# Patient Record
Sex: Male | Born: 1945 | Race: White | Hispanic: No | Smoking: Former smoker
Health system: Southern US, Community
[De-identification: ages and names within clinical notes are randomized; demographics above are authoritative.]

## PROBLEM LIST (undated history)

## (undated) DIAGNOSIS — M8580 Other specified disorders of bone density and structure, unspecified site: Secondary | ICD-10-CM

## (undated) DIAGNOSIS — K648 Other hemorrhoids: Secondary | ICD-10-CM

## (undated) DIAGNOSIS — T7840XA Allergy, unspecified, initial encounter: Secondary | ICD-10-CM

## (undated) DIAGNOSIS — Z923 Personal history of irradiation: Secondary | ICD-10-CM

## (undated) DIAGNOSIS — I2510 Atherosclerotic heart disease of native coronary artery without angina pectoris: Secondary | ICD-10-CM

## (undated) DIAGNOSIS — M1990 Unspecified osteoarthritis, unspecified site: Secondary | ICD-10-CM

## (undated) DIAGNOSIS — K580 Irritable bowel syndrome with diarrhea: Secondary | ICD-10-CM

## (undated) DIAGNOSIS — G629 Polyneuropathy, unspecified: Secondary | ICD-10-CM

## (undated) DIAGNOSIS — Z87898 Personal history of other specified conditions: Secondary | ICD-10-CM

## (undated) DIAGNOSIS — J45909 Unspecified asthma, uncomplicated: Secondary | ICD-10-CM

## (undated) DIAGNOSIS — R011 Cardiac murmur, unspecified: Secondary | ICD-10-CM

## (undated) DIAGNOSIS — M810 Age-related osteoporosis without current pathological fracture: Secondary | ICD-10-CM

## (undated) DIAGNOSIS — N320 Bladder-neck obstruction: Secondary | ICD-10-CM

## (undated) DIAGNOSIS — Z960 Presence of urogenital implants: Secondary | ICD-10-CM

## (undated) DIAGNOSIS — C61 Malignant neoplasm of prostate: Secondary | ICD-10-CM

## (undated) DIAGNOSIS — G43909 Migraine, unspecified, not intractable, without status migrainosus: Secondary | ICD-10-CM

## (undated) DIAGNOSIS — Z978 Presence of other specified devices: Secondary | ICD-10-CM

## (undated) DIAGNOSIS — D649 Anemia, unspecified: Secondary | ICD-10-CM

## (undated) DIAGNOSIS — R002 Palpitations: Secondary | ICD-10-CM

## (undated) DIAGNOSIS — E785 Hyperlipidemia, unspecified: Secondary | ICD-10-CM

## (undated) DIAGNOSIS — I491 Atrial premature depolarization: Secondary | ICD-10-CM

## (undated) DIAGNOSIS — K5790 Diverticulosis of intestine, part unspecified, without perforation or abscess without bleeding: Secondary | ICD-10-CM

## (undated) DIAGNOSIS — N210 Calculus in bladder: Secondary | ICD-10-CM

## (undated) DIAGNOSIS — N401 Enlarged prostate with lower urinary tract symptoms: Secondary | ICD-10-CM

## (undated) HISTORY — DX: Hyperlipidemia, unspecified: E78.5

## (undated) HISTORY — PX: PROSTATE BIOPSY: SHX241

## (undated) HISTORY — DX: Allergy, unspecified, initial encounter: T78.40XA

## (undated) HISTORY — PX: CATARACT EXTRACTION W/ INTRAOCULAR LENS  IMPLANT, BILATERAL: SHX1307

## (undated) HISTORY — PX: CYSTOSCOPY WITH INSERTION OF UROLIFT: SHX6678

## (undated) HISTORY — DX: Age-related osteoporosis without current pathological fracture: M81.0

## (undated) HISTORY — DX: Irritable bowel syndrome with diarrhea: K58.0

## (undated) HISTORY — PX: OTHER SURGICAL HISTORY: SHX169

## (undated) HISTORY — DX: Diverticulosis of intestine, part unspecified, without perforation or abscess without bleeding: K57.90

## (undated) HISTORY — DX: Bladder-neck obstruction: N32.0

## (undated) HISTORY — DX: Unspecified asthma, uncomplicated: J45.909

## (undated) HISTORY — DX: Other hemorrhoids: K64.8

## (undated) HISTORY — DX: Unspecified osteoarthritis, unspecified site: M19.90

## (undated) HISTORY — DX: Cardiac murmur, unspecified: R01.1

## (undated) HISTORY — PX: KNEE ARTHROSCOPY: SUR90

## (undated) HISTORY — PX: CIRCUMCISION: SUR203

## (undated) HISTORY — PX: CARDIOVASCULAR STRESS TEST: SHX262

---

## 1990-04-22 HISTORY — PX: INGUINAL HERNIA REPAIR: SUR1180

## 1999-03-09 ENCOUNTER — Encounter: Payer: Self-pay | Admitting: *Deleted

## 1999-03-09 ENCOUNTER — Encounter: Admission: RE | Admit: 1999-03-09 | Discharge: 1999-03-09 | Payer: Self-pay | Admitting: *Deleted

## 2013-02-09 ENCOUNTER — Encounter: Payer: Self-pay | Admitting: Neurology

## 2013-02-10 ENCOUNTER — Encounter: Payer: Self-pay | Admitting: Neurology

## 2013-02-10 ENCOUNTER — Encounter (INDEPENDENT_AMBULATORY_CARE_PROVIDER_SITE_OTHER): Payer: Self-pay

## 2013-02-10 ENCOUNTER — Ambulatory Visit (INDEPENDENT_AMBULATORY_CARE_PROVIDER_SITE_OTHER): Payer: Medicare Other | Admitting: Neurology

## 2013-02-10 ENCOUNTER — Ambulatory Visit (INDEPENDENT_AMBULATORY_CARE_PROVIDER_SITE_OTHER): Payer: Self-pay | Admitting: Neurology

## 2013-02-10 VITALS — BP 126/77 | HR 59 | Ht 73.0 in | Wt 182.0 lb

## 2013-02-10 DIAGNOSIS — R51 Headache: Secondary | ICD-10-CM

## 2013-02-10 DIAGNOSIS — Z0289 Encounter for other administrative examinations: Secondary | ICD-10-CM

## 2013-02-10 MED ORDER — PREDNISONE 5 MG PO TABS: ORAL_TABLET | ORAL | Status: DC

## 2013-02-10 MED ORDER — VALPROATE SODIUM 500 MG/5ML IV SOLN
1500.0000 mg | INTRAVENOUS | Status: DC
  Administered 2013-02-10: 1500 mg via INTRAVENOUS

## 2013-02-10 NOTE — Progress Notes (Signed)
Reason for visit: Headache  Dylan Woods is a 67 y.o. male  History of present illness:  Dylan Woods is a 67 year old right-handed white male with a history of headaches that began approximately 2 weeks prior to this evaluation. The patient indicates that he has a history of intermittent headaches over the last 10-12 years, usually the headaches will last about 2 hours and then go away. Occasionally the headaches may be severe, but on average the patient will have one headache every 2 weeks. The headaches may be generalized in nature, unassociated with photophobia, phonophobia, nausea or vomiting, or focal symptoms of numbness or weakness of the face, arms, or legs. The patient indicates that he had onset of an unusual headache for him 2 weeks ago. The patient has had some problems with generalized headache pain, but currently the headache is on the right side of the head, and he has noted onset of pulsatile tinnitus involving the right ear. The patient again notes no nausea or vomiting, numbness or weakness on the extremities, and he has not had any visual change or focal symptoms or problems with cognitive processing. The patient has taken, Tylenol #3, but the headache has persisted. The patient denies any gait disturbance, but he does have some neck discomfort and neck stiffness. The patient is sent to this office for further evaluation. The patient has had some associated malaise with the headache, but no overt fevers or chills. No cognitive slowing has been noted with the headache.  Past Medical History  Diagnosis Date  . BPH (benign prostatic hyperplasia)   . Chronic headaches   . Headache(784.0) 02/10/2013  . Degenerative arthritis     Past Surgical History  Procedure Laterality Date  . Cataract extraction Bilateral   . Arthroscopic knee surgery Left   . Inguinal hernia repair Right     Family History  Problem Relation Age of Onset  . Parkinsonism Father     Social history:   reports that he has quit smoking. He has never used smokeless tobacco. He reports that he drinks alcohol. He reports that he does not use illicit drugs.  Medications:  Current Outpatient Prescriptions on File Prior to Visit  Medication Sig Dispense Refill  . acetaminophen-codeine (TYLENOL #3) 300-30 MG per tablet Take 1 tablet by mouth every 4 (four) hours as needed for pain.      Marland Kitchen ALPRAZolam (XANAX) 0.25 MG tablet Take 0.25 mg by mouth 3 (three) times daily as needed for sleep.      . Calcium Ascorbate 500 MG TABS Take 500 mg by mouth daily.      . celecoxib (CELEBREX) 200 MG capsule Take 200 mg by mouth 2 (two) times daily.      . Multiple Vitamin (MULTIVITAMIN) tablet Take 1 tablet by mouth daily.      . naproxen sodium (ANAPROX) 220 MG tablet Take 220 mg by mouth 2 (two) times daily with a meal.      . zolpidem (AMBIEN) 5 MG tablet Take 5 mg by mouth at bedtime as needed for sleep.       No current facility-administered medications on file prior to visit.      Allergies  Allergen Reactions  . Ampicillin Rash    ROS:  Out of a complete 14 system review of symptoms, the patient complains only of the following symptoms, and all other reviewed systems are negative.  Fatigue Ringing in the ears Blurred vision Incontinence, impotence Headache, weakness Decreased energy  Blood  pressure 126/77, pulse 59, height 6\' 1"  (1.854 m), weight 182 lb (82.555 kg).  Physical Exam  General: The patient is alert and cooperative at the time of the examination.  Head: Pupils are equal, round, and reactive to light. Discs are flat bilaterally.  Neck: The neck is supple, no carotid bruits are noted.  Respiratory: The respiratory examination is clear.  Cardiovascular: The cardiovascular examination reveals a regular rate and rhythm, no obvious murmurs or rubs are noted.  Neuromuscular: Range of movement of the cervical spine is full. Compression of the jugular vein on the right does not  stop the pulsatile tinnitus. No significant crepitus of the temporomandibular joints are noted.  Skin: Extremities are without significant edema.  Neurologic Exam  Mental status: The patient is alert and oriented at the time of examination.  Cranial nerves: Facial symmetry is present. There is good sensation of the face to pinprick and soft touch bilaterally. The strength of the facial muscles and the muscles to head turning and shoulder shrug are normal bilaterally. Speech is well enunciated, no aphasia or dysarthria is noted. Extraocular movements are full. Visual fields are full.  Motor: The motor testing reveals 5 over 5 strength of all 4 extremities. Good symmetric motor tone is noted throughout.  Sensory: Sensory testing is intact to pinprick, soft touch, vibration sensation, and position sense on all 4 extremities. No evidence of extinction is noted.  Coordination: Cerebellar testing reveals good finger-nose-finger and heel-to-shin bilaterally.  Gait and station: Gait is normal. Tandem gait is slightly unsteady. Romberg is negative. No drift is seen.  Reflexes: Deep tendon reflexes are symmetric and normal bilaterally. Toes are downgoing bilaterally.   Assessment/Plan:  One. Headache  The patient has an unusual type of headache that has lasted almost 2 weeks. The patient does have a history of intermittent headaches prior to this, but currently, the patient has had new onset of right-sided headaches and pulsatile tinnitus on the right. For this reason, the patient be set up for MRI of the brain, and MRA of the head. MRA is ordered to exclude a vascular source of the pulsatile tinnitus and headache. The patient will be given a Depacon injection today, and he will be placed on a prednisone Dosepak for 6 days. If the headache continues, the patient will contact our office, and a daily medication will be initiated. The patient will followup in 2 months. A sedimentation rate was done  previously, with a level of 7. A CT scan of brain was done that was unremarkable.  Marlan Palau MD 02/10/2013 7:57 PM  [facility] [address] [place], [place] [address]  Phone [phone] Fax [phone]

## 2013-02-10 NOTE — Patient Instructions (Addendum)
Patient will move around and see if pain level comes down.

## 2013-02-10 NOTE — Progress Notes (Signed)
Patient here seeing Dr. Anne Hahn.  Order for Depacon 1000mg  may repeat 500mg  additional if needed.  Patient to treatment room.  IV started in right AC, good blood return, 24g angiocath.  Depacon 1000mg /100ccNS started at 1017.  Patient headache pain is 5 out of 10.  Upon completion patient had no change in pain.  Another Depacon 500mg /100cc started, and when completed patient stated his headache down to level 3 with some pain in his right jaw.  IV discontinued and removed.  Patient to check out in NAD.

## 2013-02-15 ENCOUNTER — Telehealth: Payer: Self-pay | Admitting: Neurology

## 2013-02-15 MED ORDER — TOPIRAMATE 25 MG PO TABS: ORAL_TABLET | ORAL | Status: DC

## 2013-02-15 NOTE — Telephone Encounter (Signed)
I called patient. The headaches are still present, less severe. The prednisone and Depacon offered some benefit, but the headaches are still there. I will place the patient on Topamax. The patient has not heard about MRI scheduling, I will contact Bradly Chris.

## 2013-02-15 NOTE — Telephone Encounter (Signed)
Patient left message that even after the Depacon infusion on 02-10-13 there has been no marked improvement in his headaches.  He said they are coming and going and was told to call if not better.  He is also asking the his MRI and MRA are put on the "fast track".  [phone].

## 2013-02-23 ENCOUNTER — Telehealth: Payer: Self-pay | Admitting: Neurology

## 2013-02-23 ENCOUNTER — Ambulatory Visit
Admission: RE | Admit: 2013-02-23 | Discharge: 2013-02-23 | Disposition: A | Discharge status: Home / Self Care | Payer: Medicare Other | Source: Ambulatory Visit | Attending: Neurology | Admitting: Neurology

## 2013-02-23 DIAGNOSIS — R51 Headache: Secondary | ICD-10-CM

## 2013-02-23 DIAGNOSIS — J329 Chronic sinusitis, unspecified: Secondary | ICD-10-CM

## 2013-02-23 MED ORDER — NORTRIPTYLINE HCL 10 MG PO CAPS: ORAL_CAPSULE | ORAL | Status: DC

## 2013-02-23 NOTE — Telephone Encounter (Signed)
I called patient. The patient still having ongoing headaches, better in the morning, coming on in or around noon. The patient indicates that the headaches are on the right side the head, right jaw. MRI the brain was unremarkable, MRA was unremarkable. There is evidence of severe sinus involvement of the right maxillary sinus, but the headaches do not sound like a sinus issue. I will refer the patient to an ear nose and throat Dr. The patient will be going up to 50 mg of Topamax, I'll add nortriptyline as well.

## 2013-03-08 ENCOUNTER — Telehealth: Payer: Self-pay | Admitting: Nurse Practitioner

## 2013-03-10 NOTE — Telephone Encounter (Signed)
Please advise 

## 2013-03-10 NOTE — Telephone Encounter (Signed)
I called the patient. The patient is doing quite well, he has worked up to the 30 mg of the [employer], and he is tolerating the medication. The patient will want to come off the medication in the future his headaches continue to do well. The patient is off of prednisone. The patient was wondering if the Celebrex or meloxicam would interfere or interact with the Topamax or nortriptyline. I have no problems with the concurrent use of these medications.

## 2013-04-22 HISTORY — PX: NASAL SINUS SURGERY: SHX719

## 2013-05-06 ENCOUNTER — Ambulatory Visit: Payer: Medicare Other | Admitting: Nurse Practitioner

## 2013-05-17 ENCOUNTER — Other Ambulatory Visit: Payer: Self-pay | Admitting: Neurology

## 2013-05-25 ENCOUNTER — Ambulatory Visit (INDEPENDENT_AMBULATORY_CARE_PROVIDER_SITE_OTHER): Payer: Medicare Other | Admitting: Nurse Practitioner

## 2013-05-25 ENCOUNTER — Encounter: Payer: Self-pay | Admitting: Nurse Practitioner

## 2013-05-25 ENCOUNTER — Encounter (INDEPENDENT_AMBULATORY_CARE_PROVIDER_SITE_OTHER): Payer: Self-pay

## 2013-05-25 VITALS — BP 133/82 | HR 72 | Ht 72.0 in | Wt 179.0 lb

## 2013-05-25 DIAGNOSIS — R51 Headache: Secondary | ICD-10-CM

## 2013-05-25 NOTE — Progress Notes (Signed)
I have read the note, and I agree with the clinical assessment and plan.  WILLIS,CHARLES KEITH   

## 2013-05-25 NOTE — Progress Notes (Signed)
[facility]  PATIENT: Dylan Woods DOB: 10/05/1945   REASON FOR VISIT: Followup for headaches   HISTORY OF PRESENT ILLNESS: Mr. Boen, 68 year old male returns for followup. He was initially evaluated by Dr. [name] 02/10/2013. MRA of the head was normal. 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  . Brain parenchyma was unremarkable. He was sent to an ENT and he had a nasal endoscopy and was told he had an impacted fungal infection. He feels his headaches have come from that all along and he wants to taper and discontinue his Topamax and his Pamelor. His headaches have gotten much better since his nasal procedure. He returns for reevaluation   HISTORY: headaches that began approximately 2 weeks prior to this evaluation. The patient indicates that he has a history of intermittent headaches over the last 10-12 years, usually the headaches will last about 2 hours and then go away. Occasionally the headaches may be severe, but on average the patient will have one headache every 2 weeks. The headaches may be generalized in nature, unassociated with photophobia, phonophobia, nausea or vomiting, or focal symptoms of numbness or weakness of the face, arms, or legs. The patient indicates that he had onset of an unusual headache for him 2 weeks ago. The patient has had some problems with generalized headache pain, but currently the headache is on the right side of the head, and he has noted onset of pulsatile tinnitus involving the right ear. The patient again notes no nausea or vomiting, numbness or weakness on the extremities, and he has not had any visual change or focal symptoms or problems with cognitive processing. The patient has taken, Tylenol #3, but the headache has persisted. The patient denies any gait disturbance, but he does have some neck discomfort and neck stiffness. The patient is sent to this office for further evaluation. The  patient has had some associated malaise with the headache, but no overt fevers or chills. No cognitive slowing has been noted with the headache.  REVIEW OF SYSTEMS: Full 14 system review of systems performed and notable only for those listed, all others are neg:  Constitutional: N/A  Cardiovascular: N/A  Ear/Nose/Throat: N/A  Skin: N/A  Eyes: Blurred vision  Respiratory: N/A  Gastroitestinal: N/A  Hematology/Lymphatic: N/A  Endocrine: N/A Musculoskeletal:N/A  Allergy/Immunology: N/A  Neurological: N/A Psychiatric: N/A   ALLERGIES: Allergies  Allergen Reactions  . Ampicillin Rash    HOME MEDICATIONS: Outpatient Prescriptions Prior to Visit  Medication Sig Dispense Refill  . acetaminophen-codeine (TYLENOL #3) 300-30 MG per tablet Take 1 tablet by mouth every 4 (four) hours as needed for pain.      Marland Kitchen ALPRAZolam (XANAX) 0.25 MG tablet Take 0.25 mg by mouth 3 (three) times daily as needed for sleep.      . Calcium Ascorbate 500 MG TABS Take 500 mg by mouth daily.      . celecoxib (CELEBREX) 200 MG capsule Take 200 mg by mouth 2 (two) times daily.      . Multiple Vitamin (MULTIVITAMIN) tablet Take 1 tablet by mouth daily.      . naproxen sodium (ANAPROX) 220 MG tablet Take 220 mg by mouth 2 (two) times daily with a meal.      . nortriptyline (PAMELOR) 10 MG capsule Take 30 mg by mouth at bedtime.      . topiramate (TOPAMAX) 25 MG tablet Take 50 mg by mouth at bedtime.      Marland Kitchen  topiramate (TOPAMAX) 25 MG tablet TAKE ONE TABLET AT NIGHT FOR 1 WEEK THENTAKE 2 TABLETS AT NIGHT  60 tablet  1  . zolpidem (AMBIEN) 5 MG tablet Take 5 mg by mouth at bedtime as needed for sleep.       Facility-Administered Medications Prior to Visit  Medication Dose Route Frequency Provider Last Rate Last Dose  . valproate (DEPACON) 1,500 mg in sodium chloride 0.9 % 100 mL IVPB  1,500 mg Intravenous Continuous Kathrynn Ducking, MD   1,500 mg at 02/10/13 1020    PAST MEDICAL HISTORY: Past Medical History    Diagnosis Date  . BPH (benign prostatic hyperplasia)   . Chronic headaches   . Headache(784.0) 02/10/2013  . Degenerative arthritis     PAST SURGICAL HISTORY: Past Surgical History  Procedure Laterality Date  . Cataract extraction Bilateral   . Arthroscopic knee surgery Left   . Inguinal hernia repair Right     FAMILY HISTORY: Family History  Problem Relation Age of Onset  . Parkinsonism Father     SOCIAL HISTORY: History   Social History  . Marital Status: Widowed    Spouse Name: N/A    Number of Children: 1  . Years of Education: college   Occupational History  .      Lawyer   Social History Main Topics  . Smoking status: Former Smoker    Types: Cigarettes  . Smokeless tobacco: Never Used     Comment: Quit when he was 40  . Alcohol Use: 0.0 oz/week     Comment: moderate level  . Drug Use: No  . Sexual Activity: Not on file   Other Topics Concern  . Not on file   Social History Narrative   Patient lives at home alone widowed.   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   Right handed.   College education.   Caffeine one cup daily coffee.     PHYSICAL EXAM  Filed Vitals:   05/25/13 0932  BP: 133/82  Pulse: 72  Height: 6' (1.829 m)  Weight: 179 lb (81.194 kg)   Body mass index is 24.27 kg/(m^2).  Generalized: Well developed, in no acute distress  Head: normocephalic and atraumatic,. Oropharynx benign  Neck: Supple, no carotid bruits  Cardiac: Regular rate rhythm, no murmur  Neurological examination   Mentation: Alert oriented to time, place, history taking. Follows all commands speech and language fluent  Cranial nerve II-XII: Fundoscopic exam reveals sharp disc margins.Pupils were equal round reactive to light extraocular movements were full, visual field were full on confrontational test. Facial sensation and strength were normal. hearing was intact to finger rubbing bilaterally. Uvula tongue midline. head turning and  shoulder shrug were normal and symmetric.Tongue protrusion into cheek strength was normal. Motor: normal bulk and tone, full strength in the BUE, BLE, fine finger movements normal, no pronator drift. No focal weakness Coordination: finger-nose-finger, heel-to-shin bilaterally, no dysmetria Reflexes: Brachioradialis 2/2, biceps 2/2, triceps 2/2, patellar 2/2, Achilles 2/2, plantar responses were flexor bilaterally. Gait and Station: Rising up from seated position without assistance, normal stance,  moderate stride, good arm swing, smooth turning, able to perform tiptoe, and heel walking without difficulty. Tandem gait is steady  DIAGNOSTIC DATA (LABS, IMAGING, TESTING) -     ASSESSMENT AND PLAN  68 y.o. year old male  has a past medical history of BPH (benign prostatic hyperplasia);  Headache(784.0)  here in followup. MRA of the head was normal. 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 His headaches completely abated after his nasal procedure and he is wishing to titrate his medications and discontinued them.  Begin tapering Pamelor(Nortriptyline by 1 tab weekly until you are off the medication. This should take 2 weeks if you begin tonight.  After the taper of above, begin to taper the Topamax by 1 tab weekly until off the medication. This should take a week.  Call if headaches resume Additional 10 minutes spent explaining the tapering procedure.  F/U prn Dennie Bible, [facility], [facility], APRN  [facility] [address] [place], [place] [address] [phone]

## 2013-05-25 NOTE — Patient Instructions (Signed)
Begin tapering Pamelor(Nortriptyline by 1 tab weekly until you are off the medication. This should take 2 weeks if you begin tonight.  After the taper of above, begin to taper the Topamax by 1 tab weekly until off the medication. This should take a week.  Call if headaches resume F/U prn

## 2013-07-01 ENCOUNTER — Telehealth: Payer: Self-pay | Admitting: *Deleted

## 2013-07-02 MED ORDER — TOPIRAMATE 25 MG PO TABS: 50.0000 mg | ORAL_TABLET | Freq: Every day | ORAL | Status: DC

## 2013-07-02 NOTE — Telephone Encounter (Signed)
Talked to patient. He has totally tapered the nortriptyline and is on 25 mg of Topamax. He can increase to 2 tabs (50mg ). The headaches are mild but he does not want to ruin his vacation. Will call in.

## 2013-07-02 NOTE — Telephone Encounter (Signed)
Pt called to check the status of his request.  He states that he is on 1 Topamax and is completed off the Nortriptyline as he discussed with Cecille Rubin at their last visit.  Please call to discuss.  Thank you.

## 2013-07-02 NOTE — Telephone Encounter (Signed)
Called patient and patient stated that he was tapering off of his medication of topamax and nortriptyline. Patient stated that he is done with nortriptyline and almost done with topamax. Patient stated that he has been having mild headaches that last a couple of hours and he stated that he would be finish with topamax beginning next week and he was going on an easter trip and wanted to know if Hoyle Sauer, NP could give him a call concerning this matter. Patient concerned about headaches coming back once he completely stops medication. Please advise

## 2013-08-11 ENCOUNTER — Telehealth: Payer: Self-pay | Admitting: Neurology

## 2013-08-11 NOTE — Telephone Encounter (Signed)
Patient states [employer] has sent request to our office to fax to them Rx for Topiramate--patient says do not release  Rx to them--will keep Rx @ [facility] can call patient if needed--thank you.

## 2013-08-11 NOTE — Telephone Encounter (Signed)
Received request from [employer] this afternoon.  Noted patient does not wish to use mail order.

## 2013-09-23 ENCOUNTER — Ambulatory Visit (INDEPENDENT_AMBULATORY_CARE_PROVIDER_SITE_OTHER): Payer: Medicare Other | Admitting: General Surgery

## 2013-09-23 ENCOUNTER — Encounter (INDEPENDENT_AMBULATORY_CARE_PROVIDER_SITE_OTHER): Payer: Self-pay | Admitting: General Surgery

## 2013-09-23 VITALS — BP 134/76 | HR 76 | Temp 97.8°F | Ht 73.0 in | Wt 175.0 lb

## 2013-09-23 DIAGNOSIS — K4090 Unilateral inguinal hernia, without obstruction or gangrene, not specified as recurrent: Secondary | ICD-10-CM | POA: Insufficient documentation

## 2013-09-23 NOTE — Patient Instructions (Signed)

## 2013-09-23 NOTE — Progress Notes (Signed)
Patient ID: Dylan Woods, male   DOB: 1945/06/29, 68 y.o.   MRN: [id number]  Chief Complaint  Patient presents with  . eval lih    HPI Dylan Woods is a 68 y.o. male.  Referred by Dr Claudia Desanctis HPI 69 yom who 2 months ago note left testicular swelling and tenderness. He was diagnosed with epididymitis and has been treated with several courses of abx. He has been seen by urology and also noted to have [facility] and referred.  The swelling is better although he notes testicle is still different and sore.  There is occasional pain present.  He has been receiving toradol at the urology office.  He does note a small bulge.  He has no changes in bms or other symptoms.  Past Medical History  Diagnosis Date  . BPH (benign prostatic hyperplasia)   . Chronic headaches   . Headache(784.0) 02/10/2013  . Degenerative arthritis   . Asthma   . Heart murmur     Past Surgical History  Procedure Laterality Date  . Cataract extraction Bilateral   . Arthroscopic knee surgery Left   . Inguinal hernia repair Right   . Nasal sinus surgery      Family History  Problem Relation Age of Onset  . Parkinsonism Father     Social History History  Substance Use Topics  . Smoking status: Former Smoker    Types: Cigarettes  . Smokeless tobacco: Never Used     Comment: Quit when he was 6  . Alcohol Use: 0.0 oz/week     Comment: moderate level    Allergies  Allergen Reactions  . Ampicillin Rash    Current Outpatient Prescriptions  Medication Sig Dispense Refill  . acetaminophen-codeine (TYLENOL #3) 300-30 MG per tablet Take 1 tablet by mouth every 4 (four) hours as needed for pain.      Marland Kitchen ALPRAZolam (XANAX) 0.25 MG tablet Take 0.25 mg by mouth 3 (three) times daily as needed for sleep.      . Calcium Ascorbate 500 MG TABS Take 500 mg by mouth daily.      . Multiple Vitamin (MULTIVITAMIN) tablet Take 1 tablet by mouth daily.      . naproxen sodium (ANAPROX) 220 MG tablet Take 220 mg by mouth  2 (two) times daily with a meal.      . topiramate (TOPAMAX) 25 MG tablet Take 2 tablets (50 mg total) by mouth at bedtime.  60 tablet  6  . zolpidem (AMBIEN) 5 MG tablet Take 5 mg by mouth at bedtime as needed for sleep.      . celecoxib (CELEBREX) 200 MG capsule Take 200 mg by mouth 2 (two) times daily.       Current Facility-Administered Medications  Medication Dose Route Frequency Provider Last Rate Last Dose  . valproate (DEPACON) 1,500 mg in sodium chloride 0.9 % 100 mL IVPB  1,500 mg Intravenous Continuous Kathrynn Ducking, MD   1,500 mg at 02/10/13 1020    Review of Systems Review of Systems  Constitutional: Negative for fever, chills and unexpected weight change.  HENT: Negative for congestion, hearing loss, sore throat, trouble swallowing and voice change.   Eyes: Negative for visual disturbance.  Respiratory: Negative for cough and wheezing.   Cardiovascular: Negative for chest pain, palpitations and leg swelling.  Gastrointestinal: Negative for nausea, vomiting, abdominal pain, diarrhea, constipation, blood in stool, abdominal distention, anal bleeding and rectal pain.  Genitourinary: Negative for hematuria and difficulty urinating.  Musculoskeletal:  Negative for arthralgias.  Skin: Negative for rash and wound.  Neurological: Positive for headaches (migraine history on meds). Negative for seizures, syncope and weakness.  Hematological: Negative for adenopathy. Does not bruise/bleed easily.  Psychiatric/Behavioral: Negative for confusion.    Blood pressure 134/76, pulse 76, temperature 97.8 F (36.6 C), height 6\' 1"  (1.854 m), weight 175 lb (79.379 kg).  Physical Exam Physical Exam  Constitutional: He appears well-developed and well-nourished.  Cardiovascular: Normal rate, regular rhythm and normal heart sounds.   Pulmonary/Chest: Effort normal and breath sounds normal. He has no wheezes. He has no rales.  Abdominal: Normal appearance. He exhibits no distension. There is  no tenderness. A hernia is present. Hernia confirmed positive in the left inguinal area.  Genitourinary: Left testis shows tenderness (retracted).    Data Reviewed Dr Erma Heritage notes  Assessment    LIH epididymitis      Plan    I think he does have a small LIH but I don't think this is causing most of his current symptoms.  I think he would benefit at some point from [facility] repair but I told him that at this point I think it will not help his symptoms and may make them worse.  He is agreeable to waiting and will see back later this year.  He understands this may progress and there is always a small chance of urgent surgery.       Rolm Bookbinder 09/23/2013, 3:38 PM

## 2013-11-16 ENCOUNTER — Telehealth: Payer: Self-pay | Admitting: *Deleted

## 2013-11-16 NOTE — Telephone Encounter (Signed)
Spoke with patient, per emr he is to follow up as needed, patient has been scheduled for 11/18/13 at 2 pm.

## 2013-11-18 ENCOUNTER — Encounter: Payer: Self-pay | Admitting: Nurse Practitioner

## 2013-11-18 ENCOUNTER — Ambulatory Visit (INDEPENDENT_AMBULATORY_CARE_PROVIDER_SITE_OTHER): Payer: Medicare Other | Admitting: Nurse Practitioner

## 2013-11-18 VITALS — BP 115/68 | HR 57 | Ht 73.0 in | Wt 178.0 lb

## 2013-11-18 DIAGNOSIS — R51 Headache: Secondary | ICD-10-CM

## 2013-11-18 MED ORDER — TOPIRAMATE 25 MG PO TABS: 50.0000 mg | ORAL_TABLET | Freq: Every day | ORAL | Status: DC

## 2013-11-18 NOTE — Progress Notes (Signed)
[facility]  PATIENT: Dylan Woods DOB: 1945/05/12   REASON FOR VISIT: follow up for headache   HISTORY OF PRESENT ILLNESS:Mr. Dylan Woods, 68 year old male returns for followup. He was initially evaluated by Dr. [name] 02/10/2013 and last seen 05/25/2013. MRA of the head was normal. 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 . Brain parenchyma was unremarkable. He was sent to an ENT and he had a nasal endoscopy and was told he had an impacted fungal infection. He feels his headaches have come from that all along and he wanted to taper and discontinue his Topamax and his Pamelor after his last visit. He tapered the Pamelor but when he started tapering the Topamax to  25 mg daily his headaches returned. He is now taking 50 mg at night of Topamax and headaches are well controlled.He returns for reevaluation  HISTORY: headaches that began approximately 2 weeks prior to this evaluation. The patient indicates that he has a history of intermittent headaches over the last 10-12 years, usually the headaches will last about 2 hours and then go away. Occasionally the headaches may be severe, but on average the patient will have one headache every 2 weeks. The headaches may be generalized in nature, unassociated with photophobia, phonophobia, nausea or vomiting, or focal symptoms of numbness or weakness of the face, arms, or legs. The patient indicates that he had onset of an unusual headache for him 2 weeks ago. The patient has had some problems with generalized headache pain, but currently the headache is on the right side of the head, and he has noted onset of pulsatile tinnitus involving the right ear. The patient again notes no nausea or vomiting, numbness or weakness on the extremities, and he has not had any visual change or focal symptoms or problems with cognitive processing. The patient has taken, Tylenol #3, but the headache has persisted. The patient  denies any gait disturbance, but he does have some neck discomfort and neck stiffness. The patient is sent to this office for further evaluation. The patient has had some associated malaise with the headache, but no overt fevers or chills. No cognitive slowing has been noted with the headache.      REVIEW OF SYSTEMS: Full 14 system review of systems performed and notable only for those listed, all others are neg:  Constitutional: N/A  Cardiovascular: N/A  Ear/Nose/Throat: N/A  Skin: N/A  Eyes: N/A  Respiratory: N/A  Gastroitestinal: N/A  Hematology/Lymphatic: N/A  Endocrine: N/A Musculoskeletal:N/A  Allergy/Immunology: N/A  Neurological: N/A Psychiatric: N/A Sleep : Occasional insomnia   ALLERGIES: Allergies  Allergen Reactions  . Ampicillin Rash    HOME MEDICATIONS: Outpatient Prescriptions Prior to Visit  Medication Sig Dispense Refill  . acetaminophen-codeine (TYLENOL #3) 300-30 MG per tablet Take 1 tablet by mouth every 4 (four) hours as needed for pain.      Marland Kitchen ALPRAZolam (XANAX) 0.25 MG tablet Take 0.25 mg by mouth as needed for sleep (Taking when going on air flights).       . Calcium Ascorbate 500 MG TABS Take 1,000 mg by mouth daily.       . Multiple Vitamin (MULTIVITAMIN) tablet Take 1 tablet by mouth daily.      Marland Kitchen topiramate (TOPAMAX) 25 MG tablet Take 2 tablets (50 mg total) by mouth at bedtime.  60 tablet  6  . zolpidem (AMBIEN) 5 MG tablet Take 5 mg by mouth at bedtime as needed for sleep.      Marland Kitchen  celecoxib (CELEBREX) 200 MG capsule Take 200 mg by mouth 2 (two) times daily.      . naproxen sodium (ANAPROX) 220 MG tablet Take 220 mg by mouth 2 (two) times daily with a meal.       Facility-Administered Medications Prior to Visit  Medication Dose Route Frequency Provider Last Rate Last Dose  . valproate (DEPACON) 1,500 mg in sodium chloride 0.9 % 100 mL IVPB  1,500 mg Intravenous Continuous Kathrynn Ducking, MD   1,500 mg at 02/10/13 1020    PAST MEDICAL  HISTORY: Past Medical History  Diagnosis Date  . BPH (benign prostatic hyperplasia)   . Chronic headaches   . Headache(784.0) 02/10/2013  . Degenerative arthritis   . Asthma   . Heart murmur     PAST SURGICAL HISTORY: Past Surgical History  Procedure Laterality Date  . Cataract extraction Bilateral   . Arthroscopic knee surgery Left   . Inguinal hernia repair Right   . Nasal sinus surgery      FAMILY HISTORY: Family History  Problem Relation Age of Onset  . Parkinsonism Father     SOCIAL HISTORY: History   Social History  . Marital Status: Widowed    Spouse Name: N/A    Number of Children: 1  . Years of Education: college   Occupational History  .      Lawyer   Social History Main Topics  . Smoking status: Former Smoker    Types: Cigarettes  . Smokeless tobacco: Never Used     Comment: Quit when he was 22  . Alcohol Use: 0.0 oz/week     Comment: moderate level  . Drug Use: No  . Sexual Activity: Not on file   Other Topics Concern  . Not on file   Social History Narrative   Patient lives at home alone widowed.   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   Right handed.   College education.   Caffeine one cup daily coffee.     PHYSICAL EXAM  Filed Vitals:   11/18/13 1347  BP: 115/68  Pulse: 57  Height: 6\' 1"  (1.854 m)  Weight: 178 lb (80.74 kg)   Body mass index is 23.49 kg/(m^2). Generalized: Well developed, in no acute distress  Head: normocephalic and atraumatic,. Oropharynx benign  Neck: Supple, no carotid bruits  Neurological examination  Mentation: Alert oriented to time, place, history taking. Follows all commands speech and language fluent  Cranial nerve II-XII: Pupils were equal round reactive to light extraocular movements were full, visual field were full on confrontational test. Facial sensation and strength were normal. hearing was intact to finger rubbing bilaterally. Uvula tongue midline. head turning and  shoulder shrug were normal and symmetric.Tongue protrusion into cheek strength was normal.  Motor: normal bulk and tone, full strength in the BUE, BLE, fine finger movements normal, no pronator drift. No focal weakness  Coordination: finger-nose-finger, heel-to-shin bilaterally, no dysmetria  Reflexes: Brachioradialis 2/2, biceps 2/2, triceps 2/2, patellar 2/2, Achilles 2/2, plantar responses were flexor bilaterally.  Gait and Station: Rising up from seated position without assistance, normal stance, moderate stride, good arm swing, smooth turning, able to perform tiptoe, and heel walking without difficulty. Tandem gait is steady     DIAGNOSTIC DATA (LABS, IMAGING, TESTING) -  ASSESSMENT AND PLAN  68 y.o. year old male  has a past medical history of BPH (benign prostatic hyperplasia); Chronic headaches; GOTLXBWI(203.5) (02/10/2013); Degenerative arthritis; here to followup.   Continue  Topamax 50 mg at night Will refill Followup in 6-8 months next visit with Dr. Jerline Pain, [facility], [facility], APRN  [facility] [address], [place] [employer], [place] [address] [phone]

## 2013-11-18 NOTE — Progress Notes (Signed)
I have read the note, and I agree with the clinical assessment and plan.  WILLIS,CHARLES KEITH   

## 2013-11-18 NOTE — Patient Instructions (Signed)
Continue Topamax 50 mg at night Will refill Followup in 6-8 months next visit with Dr. [name]

## 2013-12-03 ENCOUNTER — Telehealth (INDEPENDENT_AMBULATORY_CARE_PROVIDER_SITE_OTHER): Payer: Self-pay

## 2013-12-03 NOTE — Telephone Encounter (Signed)
Pt called stating he has scheduled his follow up appt with Dr Donne Hazel to discuss [facility]. He did get transferred to triage because he has been having increased testicle pain that his urologist has treated in the past with pain medication. He does have norco left over but states that is not touching his pain. Per Dr Cristal Generous note, he wasn't sure the [facility] was the cause of his pain. I advised pt to first call his urology office since they have treated his pain in the past. If they refer him back to our office then I advised him to call back and we would be glad to let Dr Donne Hazel address his testicle pain then. Pt agreeable to this plan.

## 2014-01-17 ENCOUNTER — Encounter (INDEPENDENT_AMBULATORY_CARE_PROVIDER_SITE_OTHER): Payer: Medicare Other | Admitting: General Surgery

## 2014-03-09 ENCOUNTER — Encounter: Payer: Self-pay | Admitting: Neurology

## 2014-03-15 ENCOUNTER — Encounter: Payer: Self-pay | Admitting: Neurology

## 2014-04-11 ENCOUNTER — Ambulatory Visit (INDEPENDENT_AMBULATORY_CARE_PROVIDER_SITE_OTHER): Payer: Medicare Other | Admitting: Neurology

## 2014-04-11 ENCOUNTER — Encounter: Payer: Self-pay | Admitting: Neurology

## 2014-04-11 ENCOUNTER — Telehealth: Payer: Self-pay | Admitting: Nurse Practitioner

## 2014-04-11 VITALS — BP 125/73 | HR 50

## 2014-04-11 DIAGNOSIS — R519 Headache, unspecified: Secondary | ICD-10-CM

## 2014-04-11 DIAGNOSIS — R51 Headache: Secondary | ICD-10-CM

## 2014-04-11 MED ORDER — VALPROATE SODIUM 500 MG/5ML IV SOLN
1000.0000 mg | INTRAVENOUS | Status: DC
  Administered 2014-04-11: 1000 mg via INTRAVENOUS

## 2014-04-11 MED ORDER — SODIUM CHLORIDE 0.9 % IV SOLN
375.0000 mg | INTRAVENOUS | Status: DC
  Administered 2014-04-11: 375 mg via INTRAVENOUS

## 2014-04-11 NOTE — Telephone Encounter (Signed)
Spoke to patient and he will come in today at 1300 for a Depacon infusion.

## 2014-04-11 NOTE — Progress Notes (Signed)
Patient here for Depacon infusion.  Order for Depacon 500 mg IV may repeat times one.  Patient to treatment room.  Patient headache pain level 5.   IV started in right AC, good blood return, 24g angiocath.   Depacon 500mg /100cc 0.9% NaCl started at 1335.  Upon completion patient pain level unchanged.  Another Depacon 500mg /100cc 0.9% NaCl started at 1348.  When completed only a slight decrease in pain level.  Order for Solu Medrol 500mg  IV. When withdrawing Solumedrol some spurted from the bottle leaving about 61ml vs 36ml of medication, provider notified and the 57ml of Solumedrol/100cc 0.9% NACl was given  to patient.  Upon completion of this medication patient was feeling better and pain level down to 2-3.    Vital signs at conclusion of infusion were BP 125/73 and HR 50.  IV was discontinued and removed at 1500.  Patient to check out in NAD.

## 2014-04-11 NOTE — Telephone Encounter (Signed)
Patient has had a migraine for two to three days, but today it's not going away.  He has taken two Tylenol Arthritis 650mg  about 8am.  He hasn't taken anything other than what he has been Rx.  Taking Topamax 25mg  two tabs nightly.  He has had no sinus symptoms other than ringing in his ear.  No dizziness, vomiting or nausea.  This HA is persistent and the Tylenol is not providing any relief. Please advise

## 2014-04-11 NOTE — Patient Instructions (Signed)
Patient was to return home and rest this afternoon.

## 2014-04-11 NOTE — Telephone Encounter (Signed)
Pt is having a breakthrough with his migraines and he needs some help and wishes to talk with Hoyle Sauer.  Please call and advise.

## 2014-04-11 NOTE — Telephone Encounter (Signed)
TC to patient. Headache is persistent. He tried Depacon Oct 2014 infusion and that helped. He wants to come in for infusion today. Will increase his Topamax. Butch Penny please call patient to set up infusion thanks

## 2014-05-10 ENCOUNTER — Telehealth: Payer: Self-pay | Admitting: Nurse Practitioner

## 2014-05-10 MED ORDER — TOPIRAMATE 25 MG PO CPSP: ORAL_CAPSULE | ORAL | Status: DC

## 2014-05-10 NOTE — Telephone Encounter (Signed)
Patient is calling in regard Rx Topiramate.  He states that he had verbal with Dr. Hassell Done who instructed him to increase the dosage to 75 mg per day(2 tablets 3 X day).  The Rx was written for for 2 tablets twice a day.  He needs Rx to reflect a 90 day supply.  Please call.  Thanks!

## 2014-05-10 NOTE — Telephone Encounter (Signed)
Dylan Woods did update the Rx to 75mg  daily, one in am and two in pm per note on 12/21.  Rx has been resent for 90 day supply.  I called back.  Dylan Woods is aware.

## 2014-06-21 ENCOUNTER — Encounter: Payer: Self-pay | Admitting: Neurology

## 2014-06-21 ENCOUNTER — Ambulatory Visit (INDEPENDENT_AMBULATORY_CARE_PROVIDER_SITE_OTHER): Payer: Medicare Other | Admitting: Neurology

## 2014-06-21 VITALS — BP 105/59 | HR 67 | Ht 73.0 in | Wt 179.2 lb

## 2014-06-21 DIAGNOSIS — G43019 Migraine without aura, intractable, without status migrainosus: Secondary | ICD-10-CM | POA: Diagnosis not present

## 2014-06-21 DIAGNOSIS — G43009 Migraine without aura, not intractable, without status migrainosus: Secondary | ICD-10-CM | POA: Insufficient documentation

## 2014-06-21 MED ORDER — TOPIRAMATE 25 MG PO CPSP
ORAL_CAPSULE | ORAL | Status: DC
Start: 2014-06-21 — End: 2014-12-27

## 2014-06-21 NOTE — Progress Notes (Signed)
Reason for visit: Migraine headache  Dylan Woods is an 69 y.o. male  History of present illness:  Dylan Woods is a 69 year old right-handed white male with a history of migraine headaches. 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 The patient however has continued to have headaches. The Topamax is been quite effective, and he has been able to come off of the nortriptyline. The patient is on 25 mg of Topamax in the morning and 50 mg in the evening. His migraine headaches will oftentimes begin with a dull achy sensation in the mandible on the right. He recently developed a pneumonia, and subsequently he has had some episodes of headaches that are more frequent, associated with a pressure sensation. This is different from his usual migraine. The patient still getting over the pneumonia. He also reports bilateral tinnitus that has developed, and he indicates that he has some issues with insomnia at night, waking up in the middle the night with anxiety. The patient is taking some Xanax if needed for this issue. He returns for an evaluation.  Past Medical History  Diagnosis Date  . BPH (benign prostatic hyperplasia)   . Chronic headaches   . Headache(784.0) 02/10/2013  . Degenerative arthritis   . Asthma   . Heart murmur   . Common migraine 06/21/2014    Past Surgical History  Procedure Laterality Date  . Cataract extraction Bilateral   . Arthroscopic knee surgery Left   . Inguinal hernia repair Right   . Nasal sinus surgery      Family History  Problem Relation Age of Onset  . Parkinsonism Father     Social history:  reports that he has quit smoking. His smoking use included Cigarettes. He has never used smokeless tobacco. He reports that he drinks alcohol. He reports that he does not use illicit drugs.    Allergies  Allergen Reactions  . Ampicillin Rash     Medications:  Prior to Admission medications   Medication Sig Start Date End Date Taking? Authorizing Provider  acetaminophen-codeine (TYLENOL #3) 300-30 MG per tablet Take 1 tablet by mouth every 4 (four) hours as needed for pain.   Yes Historical Provider, MD  acyclovir (ZOVIRAX) 200 MG capsule Take 200 mg by mouth daily as needed. For cold sores 07/19/13  Yes Historical Provider, MD  ALPRAZolam (XANAX) 0.25 MG tablet Take 0.25 mg by mouth as needed for sleep (Taking when going on air flights).    Yes Historical Provider, MD  Calcium Ascorbate 500 MG TABS Take 1,000 mg by mouth daily.    Yes Historical Provider, MD  finasteride (PROSCAR) 5 MG tablet Take 5 mg by mouth daily. Taking 2.5mg  every other day.   Yes Historical Provider, MD  Multiple Vitamin (MULTIVITAMIN) tablet Take 1 tablet by mouth daily.   Yes Historical Provider, MD  topiramate (TOPAMAX) 25 MG capsule 1 am and 2 pm Patient taking differently: Taking 25mg  in morning and 50mg  at night. 05/10/14  Yes Kathrynn Ducking, MD  zolpidem (AMBIEN) 10 MG tablet Take 10 mg by mouth at bedtime as needed for sleep.   Yes Historical Provider, MD    ROS:  Out of a complete 14 system review of symptoms, the patient complains only of the following symptoms, and all other reviewed systems are negative.  Frequent waking Testicular pain Headache  Blood pressure 105/59,  pulse 67, height 6\' 1"  (1.854 m), weight 179 lb 3.2 oz (81.285 kg).  Physical Exam  General: The patient is alert and cooperative at the time of the examination.  Skin: No significant peripheral edema is noted.   Neurologic Exam  Mental status: The patient is oriented x 3.  Cranial nerves: Facial symmetry is present. Speech is normal, no aphasia or dysarthria is noted. Extraocular movements are full. Visual fields are full.  Motor: The patient has good strength in all 4 extremities.  Sensory examination: Soft touch sensation is symmetric on the face, arms, and  legs.  Coordination: The patient has good finger-nose-finger and heel-to-shin bilaterally.  Gait and station: The patient has a normal gait. Tandem gait is normal. Romberg is negative. No drift is seen.  Reflexes: Deep tendon reflexes are symmetric.   Assessment/Plan:  1. Migraine headache, common migraine  2. Headache secondary to pneumonia  3. Bilateral tinnitus  4. Insomnia, anxiety  The patient is now having more frequent headaches associated with pneumonia. We will watch conservatively at this time, but if this does not abate, the patient is to contact me. His typical headaches have been well controlled on the Topamax. 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 The patient reports some anxiety in the middle of the night associated with insomnia, this is currently being controlled with Xanax, but if needed, Remeron can be used in the future. He will otherwise follow-up in 6-8 months.  Jill Alexanders MD 06/21/2014 9:09 PM  [facility] [address] [place], [place] [address]  Phone [phone] Fax [phone]

## 2014-06-21 NOTE — Patient Instructions (Signed)

## 2014-11-03 ENCOUNTER — Telehealth: Payer: Self-pay | Admitting: Neurology

## 2014-11-03 MED ORDER — TRAMADOL HCL 50 MG PO TABS: 50.0000 mg | ORAL_TABLET | Freq: Four times a day (QID) | ORAL | Status: DC | PRN

## 2014-11-03 NOTE — Telephone Encounter (Signed)
I called the patient. He stated that he has had a migraine for about a day and a half. He stated it is not severe pain but it is persistent and he is only comfortable if he is lying in the bed. He is still taking his 75 mg of Topamax a day, but wondered if he could have anything to help with the pain right now. He wondered if it could be heat induced. He stated that he walks 5 miles Monday, Wednesday and Friday.

## 2014-11-03 NOTE — Telephone Encounter (Signed)
I called patient. The patient has had a headache for a day and a half. The patient remains on Topamax, I will call in a small prescription for Ultram to take if needed for the headache.

## 2014-11-03 NOTE — Telephone Encounter (Signed)
Patient called and stated he was having a migraine and would like to speak with someone about receiving some medication for the pain. Please call and advise.

## 2014-11-04 ENCOUNTER — Telehealth: Payer: Self-pay | Admitting: Neurology

## 2014-11-04 NOTE — Telephone Encounter (Signed)
Rx has been faxed to the pharmacy 

## 2014-11-04 NOTE — Telephone Encounter (Signed)
Patient called and request to speak with Cecille Rubin NP. No details were given. Please call and advise.

## 2014-11-04 NOTE — Telephone Encounter (Signed)
I called pt and he stated that he did not speak to Dr. [name] but got VM.  I relayed the message about the ultram 50mg  po prn every 6 hours.  Pt will pick up at [facility] November.  (was faxed this am to [facility]).  I changed pts RV appt from MM/NP to CM/NP who has been seeing him,  for same date but at 45.  Pt made aware and did express that he would like to stay with CM.

## 2014-11-16 ENCOUNTER — Other Ambulatory Visit: Payer: Self-pay | Admitting: Family Medicine

## 2014-11-16 DIAGNOSIS — Z87891 Personal history of nicotine dependence: Secondary | ICD-10-CM

## 2014-11-22 ENCOUNTER — Other Ambulatory Visit: Payer: Self-pay | Admitting: Family Medicine

## 2014-11-22 ENCOUNTER — Ambulatory Visit: Payer: Medicare Other

## 2014-11-22 ENCOUNTER — Ambulatory Visit
Admission: RE | Admit: 2014-11-22 | Discharge: 2014-11-22 | Disposition: A | Discharge status: Home / Self Care | Payer: Medicare Other | Source: Ambulatory Visit | Attending: Family Medicine | Admitting: Family Medicine

## 2014-11-22 DIAGNOSIS — Z87891 Personal history of nicotine dependence: Secondary | ICD-10-CM

## 2014-12-27 ENCOUNTER — Ambulatory Visit (INDEPENDENT_AMBULATORY_CARE_PROVIDER_SITE_OTHER): Payer: Medicare Other | Admitting: Nurse Practitioner

## 2014-12-27 ENCOUNTER — Encounter: Payer: Self-pay | Admitting: Nurse Practitioner

## 2014-12-27 ENCOUNTER — Ambulatory Visit: Payer: Medicare Other | Admitting: Adult Health

## 2014-12-27 VITALS — BP 106/63 | HR 64 | Ht 72.0 in | Wt 179.4 lb

## 2014-12-27 DIAGNOSIS — R51 Headache: Secondary | ICD-10-CM

## 2014-12-27 DIAGNOSIS — G43019 Migraine without aura, intractable, without status migrainosus: Secondary | ICD-10-CM

## 2014-12-27 DIAGNOSIS — N400 Enlarged prostate without lower urinary tract symptoms: Secondary | ICD-10-CM | POA: Insufficient documentation

## 2014-12-27 DIAGNOSIS — R519 Headache, unspecified: Secondary | ICD-10-CM

## 2014-12-27 DIAGNOSIS — G4710 Hypersomnia, unspecified: Secondary | ICD-10-CM | POA: Diagnosis not present

## 2014-12-27 DIAGNOSIS — R400 Somnolence: Secondary | ICD-10-CM | POA: Insufficient documentation

## 2014-12-27 MED ORDER — TOPIRAMATE 25 MG PO CPSP: ORAL_CAPSULE | ORAL | Status: DC

## 2014-12-27 NOTE — Progress Notes (Signed)
I have read the note, and I agree with the clinical assessment and plan.  WILLIS,CHARLES KEITH   

## 2014-12-27 NOTE — Patient Instructions (Addendum)
Increase Topamax to 2 tablets a.m. and 2 tablets p.m. Used tramadol for acute headache Instead of Tylenol PM use Benadryl as needed for sleep Will set up for sleep study Follow up with sleep doctor after the study Follow up with me in 6 months

## 2014-12-27 NOTE — Progress Notes (Signed)
[facility]  PATIENT: Dylan Woods DOB: December 19, 1945   REASON FOR VISIT: Follow-up for headaches, new complaint of daytime drowsiness and morning headaches HISTORY FROM: Patient    HISTORY OF PRESENT ILLNESS:Dylan Woods is a 69 year old right-handed white male with a history of migraine headaches. 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 The patient however has continued to have headaches. The Topamax is been quite effective, and he has been able to come off of the nortriptyline. The patient is on 25 mg of Topamax in the morning and 50 mg in the evening. His migraine headaches will oftentimes begin with a dull achy sensation in the mandible on the right.  He also reports bilateral tinnitus that has developed, and he indicates that he has some issues with insomnia at night, waking up in the middle the night with anxiety. The patient is taking some Xanax if needed for this issue. He also is complaining of daytime fatigue and in addition waking up with headaches in the morning. He returns for an evaluation.    REVIEW OF SYSTEMS: Full 14 system review of systems performed and notable only for those listed, all others are neg:  Constitutional: Fatigue Cardiovascular: neg Ear/Nose/Throat: Ringing in the ears Skin: neg Eyes: neg Respiratory: neg Gastroitestinal: neg  Hematology/Lymphatic: neg  Endocrine: neg Musculoskeletal:neg Allergy/Immunology: neg Neurological: Headache Psychiatric: neg Sleep : Daytime sleepiness   ALLERGIES: Allergies  Allergen Reactions  . Ampicillin Rash    HOME MEDICATIONS: Outpatient Prescriptions Prior to Visit  Medication Sig Dispense Refill  . acetaminophen-codeine (TYLENOL #3) 300-30 MG per tablet Take 1 tablet by mouth every 4 (four) hours as needed for pain.    Marland Kitchen acyclovir (ZOVIRAX) 200 MG capsule Take 200  mg by mouth daily as needed. For cold sores    . ALPRAZolam (XANAX) 0.25 MG tablet Take 0.25 mg by mouth as needed for sleep (Taking when going on air flights).     . Calcium Ascorbate 500 MG TABS Take 1,000 mg by mouth daily.     . Multiple Vitamin (MULTIVITAMIN) tablet Take 1 tablet by mouth daily.    Marland Kitchen topiramate (TOPAMAX) 25 MG capsule 1 am and 2 pm 270 capsule 2  . traMADol (ULTRAM) 50 MG tablet Take 1 tablet (50 mg total) by mouth every 6 (six) hours as needed. 30 tablet 1  . zolpidem (AMBIEN) 10 MG tablet Take 10 mg by mouth at bedtime as needed for sleep.    . finasteride (PROSCAR) 5 MG tablet Take 5 mg by mouth daily. Taking 2.5mg  every other day.     Facility-Administered Medications Prior to Visit  Medication Dose Route Frequency Provider Last Rate Last Dose  . methylPREDNISolone sodium succinate (SOLU-MEDROL) 375 mg in sodium chloride 0.9 % 100 mL IVPB  375 mg Intravenous Continuous Dennie Bible, NP   Stopped at 04/11/14 1500    PAST MEDICAL HISTORY: Past Medical History  Diagnosis Date  . BPH (benign prostatic hyperplasia)   . Chronic headaches   . Headache(784.0) 02/10/2013  . Degenerative arthritis   . Asthma   . Heart murmur   . Common migraine 06/21/2014    PAST SURGICAL HISTORY: Past Surgical History  Procedure Laterality Date  . Cataract extraction Bilateral   . Arthroscopic knee surgery Left   . Inguinal hernia repair Right   . Nasal sinus surgery  FAMILY HISTORY: Family History  Problem Relation Age of Onset  . Parkinsonism Father     SOCIAL HISTORY: Social History   Social History  . Marital Status: Widowed    Spouse Name: N/A  . Number of Children: 1  . Years of Education: college   Occupational History  .      Lawyer   Social History Main Topics  . Smoking status: Former Smoker    Types: Cigarettes  . Smokeless tobacco: Never Used     Comment: Quit when he was 14  . Alcohol Use: 0.0 oz/week     Comment: moderate level  .  Drug Use: No  . Sexual Activity: Not on file   Other Topics Concern  . Not on file   Social History Narrative   Patient lives at home alone widowed.   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   Right handed.   College education.   Caffeine one cup daily coffee.     PHYSICAL EXAM  Filed Vitals:   12/27/14 1130  BP: 106/63  Pulse: 64  Height: 6' (1.829 m)  Weight: 179 lb 6.4 oz (81.375 kg)   Body mass index is 24.33 kg/(m^2). General: The patient is alert and cooperative at the time of the examination. Neck supple size is 15.5 inches Skin: No significant peripheral edema is noted.   Neurologic Exam  Mental status: The patient is oriented x 3.ESS score is 10  Cranial nerves: Facial symmetry is present. Speech is normal, no aphasia or dysarthria is noted. Extraocular movements are full. Visual fields are full.  Motor: The patient has good strength in all 4 extremities.  Sensory examination: Soft touch sensation is symmetric on the face, arms, and legs.  Coordination: The patient has good finger-nose-finger and heel-to-shin bilaterally.  Gait and station: The patient has a normal gait. Tandem gait is normal. Romberg is negative. No drift is seen.  Reflexes: Deep tendon reflexes are symmetric.    DIAGNOSTIC DATA (LABS, IMAGING, TESTING) - ASSESSMENT AND PLAN  69 y.o. year old male  has a past medical history of  Chronic headaches; UEKCMKLK(917.9) (02/10/2013); Degenerative arthritis; Asthma; Common migraine (06/21/2014). Daytime drowsiness here to follow-up.  Increase Topamax to 2 tablets a.m. and 2 tablets p.m. Used tramadol for acute headache Instead of Tylenol PM use Benadryl as needed for sleep Will set up for sleep study Follow up with sleep doctor after the study Follow up with me in 6 months Vst time 25 min Dennie Bible, [facility], [facility], [facility] [address], [place], [place] [address] [phone]

## 2015-01-24 ENCOUNTER — Institutional Professional Consult (permissible substitution): Payer: Medicare Other | Admitting: Neurology

## 2015-05-31 DIAGNOSIS — J069 Acute upper respiratory infection, unspecified: Secondary | ICD-10-CM | POA: Diagnosis not present

## 2015-05-31 DIAGNOSIS — R05 Cough: Secondary | ICD-10-CM | POA: Diagnosis not present

## 2015-05-31 DIAGNOSIS — R0989 Other specified symptoms and signs involving the circulatory and respiratory systems: Secondary | ICD-10-CM | POA: Diagnosis not present

## 2015-06-27 ENCOUNTER — Ambulatory Visit (INDEPENDENT_AMBULATORY_CARE_PROVIDER_SITE_OTHER): Payer: Medicare Other | Admitting: Nurse Practitioner

## 2015-06-27 ENCOUNTER — Encounter: Payer: Self-pay | Admitting: Nurse Practitioner

## 2015-06-27 VITALS — BP 136/80 | HR 70 | Resp 16 | Ht 72.0 in | Wt 179.6 lb

## 2015-06-27 DIAGNOSIS — G43019 Migraine without aura, intractable, without status migrainosus: Secondary | ICD-10-CM | POA: Diagnosis not present

## 2015-06-27 DIAGNOSIS — R519 Headache, unspecified: Secondary | ICD-10-CM

## 2015-06-27 DIAGNOSIS — R51 Headache: Secondary | ICD-10-CM | POA: Diagnosis not present

## 2015-06-27 NOTE — Progress Notes (Signed)
[facility]  PATIENT: Dylan Woods DOB: Oct 14, 1945   REASON FOR VISIT: Follow-up for migraine, daytime somnolence HISTORY FROM: Patient    HISTORY OF PRESENT ILLNESS:Mr. Dylan Woods is a 70 year old right-handed white male with a history of migraine headaches. 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 The patient however has continued to have headaches. The Topamax has been been quite effective, and he has been able to come off of the nortriptyline. The patient is at 50mg    Topamax daily His migraine headaches will oftentimes begin with a dull achy sensation in the mandible on the right. He also reports bilateral tinnitus that has developed, and he indicates that he has some issues with insomnia at night, waking up in the middle the night with anxiety. The patient is taking some Xanax if needed for this issue. He claims his  daytime fatigue is better and he declined to get sleep study . He walks 4-5 miles a day several days a week. [facility]  He returns for an evaluation.   REVIEW OF SYSTEMS: Full 14 system review of systems performed and notable only for those listed, all others are neg:  Constitutional: neg  Cardiovascular: neg Ear/Nose/Throat: neg  Skin: neg Eyes: neg Respiratory: neg Gastroitestinal: neg  Hematology/Lymphatic: neg  Endocrine: neg Musculoskeletal: Arthritis Allergy/Immunology: neg Neurological: neg Psychiatric: neg Sleep : neg   ALLERGIES: Allergies  Allergen Reactions  . Ampicillin Rash    HOME MEDICATIONS: Outpatient Prescriptions Prior to Visit  Medication Sig Dispense Refill  . acetaminophen-codeine (TYLENOL #3) 300-30 MG per tablet Take 1 tablet by mouth every 4 (four) hours as needed for pain.    Marland Kitchen acyclovir (ZOVIRAX) 200 MG capsule Take 200 mg by mouth daily as needed. For cold sores    .  ALPRAZolam (XANAX) 0.25 MG tablet Take 0.25 mg by mouth as needed for sleep (Taking when going on air flights).     . Calcium Ascorbate 500 MG TABS Take 1,000 mg by mouth daily.     . Multiple Vitamin (MULTIVITAMIN) tablet Take 1 tablet by mouth daily.    Marland Kitchen topiramate (TOPAMAX) 25 MG capsule 2 am and 2 pm 360 capsule 1  . zolpidem (AMBIEN) 10 MG tablet Take 10 mg by mouth at bedtime as needed for sleep. Reported on 06/27/2015    . traMADol (ULTRAM) 50 MG tablet Take 1 tablet (50 mg total) by mouth every 6 (six) hours as needed. (Patient not taking: Reported on 06/27/2015) 30 tablet 1   Facility-Administered Medications Prior to Visit  Medication Dose Route Frequency Provider Last Rate Last Dose  . methylPREDNISolone sodium succinate (SOLU-MEDROL) 375 mg in sodium chloride 0.9 % 100 mL IVPB  375 mg Intravenous Continuous Dennie Bible, NP   Stopped at 04/11/14 1500    PAST MEDICAL HISTORY: Past Medical History  Diagnosis Date  . BPH (benign prostatic hyperplasia)   . Chronic headaches   . Headache(784.0) 02/10/2013  . Degenerative arthritis   . Asthma   . Heart murmur   . Common migraine 06/21/2014    PAST SURGICAL HISTORY: Past Surgical History  Procedure Laterality Date  . Cataract extraction Bilateral   . Arthroscopic knee surgery Left   . Inguinal hernia repair Right   . Nasal sinus surgery      FAMILY HISTORY: Family History  Problem Relation Age  of Onset  . Parkinsonism Father     SOCIAL HISTORY: Social History   Social History  . Marital Status: Widowed    Spouse Name: N/A  . Number of Children: 1  . Years of Education: college   Occupational History  .      Lawyer   Social History Main Topics  . Smoking status: Former Smoker    Types: Cigarettes  . Smokeless tobacco: Never Used     Comment: Quit when he was 52  . Alcohol Use: 0.0 oz/week     Comment: moderate level  . Drug Use: No  . Sexual Activity: Not on file   Other Topics Concern  . Not on  file   Social History Narrative   Patient lives at home alone widowed.   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   Right handed.   College education.   Caffeine one cup daily coffee.     PHYSICAL EXAM  Filed Vitals:   06/27/15 1133  BP: 136/80  Pulse: 70  Resp: 16  Height: 6' (1.829 m)  Weight: 179 lb 9.6 oz (81.466 kg)   Body mass index is 24.35 kg/(m^2). General: The patient is alert and cooperative at the time of the examination. Neck supple No bruits Skin: No significant peripheral edema is noted. Neurologic Exam Mental status: The patient is oriented x 3.language including expression naming repetition are intact. Fund of knowledge is intact Cranial nerves: Facial symmetry is present. Speech is normal, no aphasia or dysarthria is noted. Extraocular movements are full. Visual fields are full. Motor: The patient has good strength in all 4 extremities. Sensory examination: Soft touch sensation is symmetric on the face, arms, and legs. Coordination: The patient has good finger-nose-finger and heel-to-shin bilaterally. Gait and station: The patient has a normal gait. Tandem gait is normal. Romberg is negative. No drift is seen. Reflexes: Deep tendon reflexes are symmetric.  DIAGNOSTIC DATA (LABS, IMAGING, TESTING) - ASSESSMENT AND PLAN  70 y.o. year old male  has a past medical history of BPH (benign prostatic hyperplasia); [id number]) (02/10/2013); Degenerative arthritis; Asthma; Heart murmur; and Common migraine (06/21/2014). here to follow up. His migraines are in excellent control   Continue Topamax at 50 mg daily Use Advil prn F/U in 6 months Dennie Bible, [facility], [facility], APRN  [facility] [address], [place], [place] [address] [phone]

## 2015-06-27 NOTE — Progress Notes (Signed)
I have read the note, and I agree with the clinical assessment and plan.  Keondrick Dilks KEITH   

## 2015-06-27 NOTE — Patient Instructions (Signed)
Continue Topamax at 50 mg daily Use Advil prn F/U in 6 months

## 2015-07-11 DIAGNOSIS — L821 Other seborrheic keratosis: Secondary | ICD-10-CM | POA: Diagnosis not present

## 2015-07-11 DIAGNOSIS — D1801 Hemangioma of skin and subcutaneous tissue: Secondary | ICD-10-CM | POA: Diagnosis not present

## 2015-07-11 DIAGNOSIS — L304 Erythema intertrigo: Secondary | ICD-10-CM | POA: Diagnosis not present

## 2015-08-01 DIAGNOSIS — Z961 Presence of intraocular lens: Secondary | ICD-10-CM | POA: Diagnosis not present

## 2015-09-27 ENCOUNTER — Ambulatory Visit (HOSPITAL_COMMUNITY): Payer: Medicare Other

## 2015-09-27 ENCOUNTER — Ambulatory Visit (HOSPITAL_COMMUNITY)
Admission: EM | Admit: 2015-09-27 | Discharge: 2015-09-27 | Disposition: A | Discharge status: Home / Self Care | Payer: Medicare Other | Attending: Emergency Medicine | Admitting: Emergency Medicine

## 2015-09-27 ENCOUNTER — Encounter (HOSPITAL_COMMUNITY): Payer: Self-pay | Admitting: Nurse Practitioner

## 2015-09-27 DIAGNOSIS — W1809XA Striking against other object with subsequent fall, initial encounter: Secondary | ICD-10-CM

## 2015-09-27 DIAGNOSIS — Y92410 Unspecified street and highway as the place of occurrence of the external cause: Secondary | ICD-10-CM | POA: Insufficient documentation

## 2015-09-27 DIAGNOSIS — Z79899 Other long term (current) drug therapy: Secondary | ICD-10-CM | POA: Insufficient documentation

## 2015-09-27 DIAGNOSIS — Z880 Allergy status to penicillin: Secondary | ICD-10-CM | POA: Insufficient documentation

## 2015-09-27 DIAGNOSIS — S79922A Unspecified injury of left thigh, initial encounter: Secondary | ICD-10-CM | POA: Diagnosis not present

## 2015-09-27 DIAGNOSIS — M79652 Pain in left thigh: Secondary | ICD-10-CM | POA: Diagnosis not present

## 2015-09-27 DIAGNOSIS — Z87891 Personal history of nicotine dependence: Secondary | ICD-10-CM | POA: Diagnosis not present

## 2015-09-27 DIAGNOSIS — W19XXXA Unspecified fall, initial encounter: Secondary | ICD-10-CM | POA: Diagnosis not present

## 2015-09-27 DIAGNOSIS — S7012XA Contusion of left thigh, initial encounter: Secondary | ICD-10-CM | POA: Insufficient documentation

## 2015-09-27 DIAGNOSIS — S8992XA Unspecified injury of left lower leg, initial encounter: Secondary | ICD-10-CM | POA: Diagnosis present
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
## 2015-09-27 NOTE — ED Notes (Signed)
Pt c/o swelling and pain to L upper leg after he tripped and fell while running this afternoon. He went home and applied ice but pain and swelling continue to increase. Cms intact. Abrasion over L knee covered with dressing by patient, bleeding controlled.

## 2015-09-27 NOTE — Discharge Instructions (Signed)
Contusion A contusion is a deep bruise. Contusions are the result of a blunt injury to tissues and muscle fibers under the skin. The injury causes bleeding under the skin. The skin overlying the contusion may turn blue, purple, or yellow. Minor injuries will give you a painless contusion, but more severe contusions may stay painful and swollen for a few weeks.  CAUSES  This condition is usually caused by a blow, trauma, or direct force to an area of the body. SYMPTOMS  Symptoms of this condition include:  Swelling of the injured area.  Pain and tenderness in the injured area.  Discoloration. The area may have redness and then turn blue, purple, or yellow. DIAGNOSIS  This condition is diagnosed based on a physical exam and medical history. An X-ray, CT scan, or MRI may be needed to determine if there are any associated injuries, such as broken bones (fractures). TREATMENT  Specific treatment for this condition depends on what area of the body was injured. In general, the best treatment for a contusion is resting, icing, applying pressure to (compression), and elevating the injured area. This is often called the RICE strategy. Over-the-counter anti-inflammatory medicines may also be recommended for pain control.  HOME CARE INSTRUCTIONS   Rest the injured area.  If directed, apply ice to the injured area:  Put ice in a plastic bag.  Place a towel between your skin and the bag.  Leave the ice on for 20 minutes, 2-3 times per day.  If directed, apply light compression to the injured area using an elastic bandage. Make sure the bandage is not wrapped too tightly. Remove and reapply the bandage as directed by your health care provider.  If possible, raise (elevate) the injured area above the level of your heart while you are sitting or lying down.  Take over-the-counter and prescription medicines only as told by your health care provider. SEEK MEDICAL CARE IF:  Your symptoms do not  improve after several days of treatment.  Your symptoms get worse.  You have difficulty moving the injured area. SEEK IMMEDIATE MEDICAL CARE IF:   You have severe pain.  You have numbness in a hand or foot.  Your hand or foot turns pale or cold.   This information is not intended to replace advice given to you by your health care provider. Make sure you discuss any questions you have with your health care provider.   Document Released: 01/16/2005 Document Revised: 12/28/2014 Document Reviewed: 08/24/2014 Elsevier Interactive Patient Education 2016 [employer]   Quadriceps Contusion With Rehab A contusion is a bruise to the skin and underlying tissues. The muscles on the top of the thigh (quadriceps) are a common location for a contusion. The contusion is caused by trauma that results in bleeding that enters the muscles, tendons, and skin.  SYMPTOMS   Pain, inflammation, and tenderness over the quadriceps muscle.  Characteristic "black and blue" discoloration.  Feeling of firmness when pressure is exerted at the injury site.  Limited function of the quadriceps muscles (straightening the knee or bending the hip).  Knee stiffness or pain when trying to bend the knee. CAUSES  Quadriceps contusions are caused by direct trauma to the thigh the ruptures small blood vessels, allowing blood to enter the soft tissues. RISK INCREASES WITH:  Contact sports (football, rugby, or soccer).  Failure to wear protective equipment.  Bleeding disorder or use of blood thinners (anticoagulants), or nonsteroidal anti-inflammatory medications. PREVENTION   Wear properly fitted and padded protective equipment.  If you have a bleeding disorder, then avoid any activities that may result in a traumatic injury.  Limit use of anticoagulants and nonsteroidal anti-inflammatory medications. PROGNOSIS  If treated properly, then quadriceps contusions usually heal within 2 weeks.  RELATED  COMPLICATIONS   Complications from excessive bleeding, especially compartment syndrome.  Bone formation (calcification) that occurs during healing and may result in pain or limited function.  Infection (uncommon).  Knee stiffness or loss of motion.  Prolonged healing time, if improperly treated or re-injured. TREATMENT  Treatment initially involves the use of ice and medication to help reduce pain and inflammation. Do not take nonsteroidal anti-inflammatory medications within 3 days of injury. The use of strengthening and stretching exercises may help reduce pain with activity. These exercises may be performed at home or with referral to a therapist. Compression bandages may also help reduce pain and inflammation. Rarely, your caregiver may use a needle to remove the collection of blood under the skin (hematoma).  MEDICATION If pain medication is necessary, then nonsteroidal anti-inflammatory medications, such as aspirin and ibuprofen, or other minor pain relievers, such as acetaminophen, are often recommended.  HEAT AND COLD  Cold treatment (icing) relieves pain and reduces inflammation. Cold treatment should be applied for 10 to 15 minutes every 2 to 3 hours for inflammation and pain and immediately after any activity that aggravates your symptoms. Use ice packs or massage the area with a piece of ice (ice massage).  Heat treatment may be used prior to performing the stretching and strengthening activities prescribed by your caregiver, physical therapist, or athletic trainer. Use a heat pack or soak the injury in warm water. SEEK MEDICAL CARE IF:  Treatment seems to offer no benefit, or the condition worsens.  Any medications produce adverse side effects. EXERCISES  RANGE OF MOTION (ROM) AND STRETCHING EXERCISES - Quadriceps Contusion These exercises may help you when beginning to rehabilitate your injury. Your symptoms may resolve with or without further involvement from your physician,  physical therapist or athletic trainer. While completing these exercises, remember:   Restoring tissue flexibility helps normal motion to return to the joints. This allows healthier, less painful movement and activity.  An effective stretch should be held for at least 30 seconds.  A stretch should never be painful. You should only feel a gentle lengthening or release in the stretched tissue. RANGE OF MOTION - Knee Flexion, Active  Lie on your back with both knees straight. (If this causes back discomfort, bend your opposite knee, placing your foot flat on the floor.)  Slowly slide your heel back toward your buttocks until you feel a gentle stretch in the front of your knee or thigh.  Hold for __________ seconds. Slowly slide your heel back to the starting position. Repeat __________ times. Complete this exercise __________ times per day.  STRETCH - Quadriceps, Prone   Lie on your stomach on a firm surface, such as a bed or padded floor.  Bend your right / left knee and grasp your ankle. If you are unable to reach, your ankle or pant leg, use a belt around your foot to lengthen your reach.  Gently pull your heel toward your buttocks. Your knee should not slide out to the side. You should feel a stretch in the front of your thigh and/or knee.  Hold this position for __________ seconds. Repeat __________ times. Complete this stretch __________ times per day.  STRENGTHENING EXERCISES - Quadriceps Contusion These exercises may help you when beginning to rehabilitate your  injury. They may resolve your symptoms with or without further involvement from your physician, physical therapist or athletic trainer. While completing these exercises, remember:   Muscles can gain both the endurance and the strength needed for everyday activities through controlled exercises.  Complete these exercises as instructed by your physician, physical therapist or athletic trainer. Progress the resistance and  repetitions only as guided. STRENGTH - Quadriceps, Isometrics  Lie on your back with your right / left leg extended and your opposite knee bent.  Gradually tense the muscles in the front of your right / left thigh. You should see either your knee cap slide up toward your hip or increased dimpling just above the knee. This motion will push the back of the knee down toward the floor/mat/bed on which you are lying.  Hold the muscle as tight as you can without increasing your pain for __________ seconds.  Relax the muscles slowly and completely in between each repetition. Repeat __________ times. Complete this exercise __________ times per day.  STRENGTH - Quadriceps, Short Arcs   Lie on your back. Place a __________ inch towel roll under your knee so that the knee slightly bends.  Raise only your lower leg by tightening the muscles in the front of your thigh. Do not allow your thigh to rise.  Hold this position for __________ seconds. Repeat __________ times. Complete this exercise __________ times per day.  OPTIONAL ANKLE WEIGHTS: Begin with ____________________, but DO NOT exceed ____________________. Increase in 1 lb/0.5 kg increments.  STRENGTH - Quadriceps, Straight Leg Raises  Quality counts! Watch for signs that the quadriceps muscle is working to insure you are strengthening the correct muscles and not "cheating" by substituting with healthier muscles.  Lay on your back with your right / left leg extended and your opposite knee bent.  Tense the muscles in the front of your right / left thigh. You should see either your knee cap slide up or increased dimpling just above the knee. Your thigh may even quiver.  Tighten these muscles even more and raise your leg 4 to 6 inches off the floor. Hold for __________ seconds.  Keeping these muscles tense, lower your leg.  Relax the muscles slowly and completely in between each repetition. Repeat __________ times. Complete this exercise  __________ times per day.  STRENGTH - Quadriceps, Wall Slides  Follow guidelines for form closely. Increased knee pain often results from poorly placed feet or knees.  Lean against a smooth wall or door and walk your feet out 18-24 inches. Place your feet hip-width apart.  Slowly slide down the wall or door until your knees bend __________ degrees.* Keep your knees over your heels, not your toes, and in line with your hips, not falling to either side.  Hold for __________ seconds. Stand up to rest for __________ seconds in between each repetition. Repeat __________ times. Complete this exercise __________ times per day. * Your physician, physical therapist or athletic trainer will alter this angle based on your symptoms and progress.   This information is not intended to replace advice given to you by your health care provider. Make sure you discuss any questions you have with your health care provider.   Document Released: 04/08/2005 Document Revised: 08/23/2014 Document Reviewed: 07/21/2008 Elsevier Interactive Patient Education [employer].

## 2015-09-27 NOTE — ED Notes (Signed)
Patient transported to [facility] via [place] shuttle.

## 2015-10-02 DIAGNOSIS — S7002XD Contusion of left hip, subsequent encounter: Secondary | ICD-10-CM | POA: Diagnosis not present

## 2015-10-02 DIAGNOSIS — S7012XD Contusion of left thigh, subsequent encounter: Secondary | ICD-10-CM | POA: Diagnosis not present

## 2015-11-28 DIAGNOSIS — Z13220 Encounter for screening for lipoid disorders: Secondary | ICD-10-CM | POA: Diagnosis not present

## 2015-11-28 DIAGNOSIS — Z0000 Encounter for general adult medical examination without abnormal findings: Secondary | ICD-10-CM | POA: Diagnosis not present

## 2015-11-28 DIAGNOSIS — K4090 Unilateral inguinal hernia, without obstruction or gangrene, not specified as recurrent: Secondary | ICD-10-CM | POA: Diagnosis not present

## 2015-11-28 DIAGNOSIS — Z125 Encounter for screening for malignant neoplasm of prostate: Secondary | ICD-10-CM | POA: Diagnosis not present

## 2015-11-28 DIAGNOSIS — N401 Enlarged prostate with lower urinary tract symptoms: Secondary | ICD-10-CM | POA: Diagnosis not present

## 2015-11-28 DIAGNOSIS — Z79899 Other long term (current) drug therapy: Secondary | ICD-10-CM | POA: Diagnosis not present

## 2015-11-28 DIAGNOSIS — F419 Anxiety disorder, unspecified: Secondary | ICD-10-CM | POA: Diagnosis not present

## 2015-11-28 DIAGNOSIS — R350 Frequency of micturition: Secondary | ICD-10-CM | POA: Diagnosis not present

## 2015-11-28 DIAGNOSIS — G44229 Chronic tension-type headache, not intractable: Secondary | ICD-10-CM | POA: Diagnosis not present

## 2015-12-05 DIAGNOSIS — R972 Elevated prostate specific antigen [PSA]: Secondary | ICD-10-CM | POA: Diagnosis not present

## 2015-12-28 ENCOUNTER — Encounter: Payer: Self-pay | Admitting: Nurse Practitioner

## 2015-12-28 ENCOUNTER — Ambulatory Visit (INDEPENDENT_AMBULATORY_CARE_PROVIDER_SITE_OTHER): Payer: Medicare Other | Admitting: Nurse Practitioner

## 2015-12-28 VITALS — BP 128/74 | HR 60 | Ht 72.0 in | Wt 179.2 lb

## 2015-12-28 DIAGNOSIS — G43019 Migraine without aura, intractable, without status migrainosus: Secondary | ICD-10-CM

## 2015-12-28 MED ORDER — TOPIRAMATE 25 MG PO TABS: 50.0000 mg | ORAL_TABLET | Freq: Every day | ORAL | 6 refills | Status: DC

## 2015-12-28 NOTE — Progress Notes (Signed)
[facility]  PATIENT: Dylan Woods DOB: 03/19/1946   REASON FOR VISIT: Follow-up for migraine/headache  HISTORY FROM: Patient    HISTORY OF PRESENT ILLNESS:Mr. Cicero is a 70 year old right-handed white male with a history of migraine headaches. 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 The patient however has continued to have headaches. The Topamax has been been quite effective, and he has been able to come off of the nortriptyline. The patient is at 50mg    Topamax daily His migraine headaches will often times begin with a dull achy sensation in the mandible on the right.  The patient is taking some Xanax for anxiety if needed.He claims his  daytime fatigue is better and he declined to get sleep study . He walks 4-5 miles a day several days a week. [facility] he is wanting to attempt to taper his Topamax completely again, this is been tried in the past was unsuccessful because his headaches returned. He returns for an evaluation.   REVIEW OF SYSTEMS: Full 14 system review of systems performed and notable only for those listed, all others are neg:  Constitutional: neg  Cardiovascular: neg Ear/Nose/Throat: neg  Skin: neg Eyes: neg Respiratory: neg Gastroitestinal: neg  Hematology/Lymphatic: neg  Endocrine: neg Musculoskeletal: Arthritis Allergy/Immunology: neg Neurological: neg Psychiatric: neg Sleep : neg   ALLERGIES: Allergies  Allergen Reactions  . Ampicillin Rash    HOME MEDICATIONS: Outpatient Medications Prior to Visit  Medication Sig Dispense Refill  . acetaminophen-codeine (TYLENOL #3) 300-30 MG per tablet Take 1 tablet by mouth every 4 (four) hours as needed for pain.    Marland Kitchen acyclovir (ZOVIRAX) 200 MG capsule Take 200 mg by mouth daily as needed. For cold sores    . ALPRAZolam (XANAX) 0.25 MG tablet Take  0.25 mg by mouth as needed for sleep (Taking when going on air flights).     . Calcium Ascorbate 500 MG TABS Take 1,000 mg by mouth daily.     . Multiple Vitamin (MULTIVITAMIN) tablet Take 1 tablet by mouth daily.    Marland Kitchen topiramate (TOPAMAX) 25 MG capsule 2 am and 2 pm 360 capsule 1  . zolpidem (AMBIEN) 10 MG tablet Take 10 mg by mouth at bedtime as needed for sleep. Reported on 06/27/2015     Facility-Administered Medications Prior to Visit  Medication Dose Route Frequency Provider Last Rate Last Dose  . methylPREDNISolone sodium succinate (SOLU-MEDROL) 375 mg in sodium chloride 0.9 % 100 mL IVPB  375 mg Intravenous Continuous Dennie Bible, NP   Stopped at 04/11/14 1500    PAST MEDICAL HISTORY: Past Medical History:  Diagnosis Date  . Asthma   . BPH (benign prostatic hyperplasia)   . Chronic headaches   . Common migraine 06/21/2014  . Degenerative arthritis   . Headache(784.0) 02/10/2013  . Heart murmur     PAST SURGICAL HISTORY: Past Surgical History:  Procedure Laterality Date  . Arthroscopic knee surgery Left   . CATARACT EXTRACTION Bilateral   . INGUINAL HERNIA REPAIR Right   . NASAL SINUS SURGERY      FAMILY HISTORY: Family History  Problem Relation Age of Onset  . Parkinsonism Father     SOCIAL HISTORY: Social History   Social History  . Marital status: Widowed    Spouse name: N/A  . Number of children: 1  .  Years of education: college   Occupational History  .  [employer] History Main Topics  . Smoking status: Former Smoker    Types: Cigarettes  . Smokeless tobacco: Never Used     Comment: Quit when he was 47  . Alcohol use 0.0 oz/week     Comment: moderate level  . Drug use: No  . Sexual activity: Not on file   Other Topics Concern  . Not on file   Social History Narrative   Patient lives at home alone widowed.   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   Right handed.    College education.   Caffeine one cup daily coffee.     PHYSICAL EXAM  Vitals:   12/28/15 0954  BP: 128/74  Pulse: 60  Weight: 179 lb 3.2 oz (81.3 kg)  Height: 6' (1.829 m)   Body mass index is 24.3 kg/m. General: The patient is alert and cooperative at the time of the examination. Neck supple No bruits Skin: No significant peripheral edema is noted. Neurologic Exam Mental status: The patient is oriented x 3.language including expression naming repetition are intact. Fund of knowledge is intact Cranial nerves: Facial symmetry is present. Speech is normal, no aphasia or dysarthria is noted. Extraocular movements are full. Visual fields are full. Motor: The patient has good strength in all 4 extremities. Sensory examination: Soft touch sensation is symmetric on the face, arms, and legs. Coordination: The patient has good finger-nose-finger and heel-to-shin bilaterally. Gait and station: The patient has a normal gait. Tandem gait is normal. Romberg is negative. No drift is seen. Reflexes: Deep tendon reflexes are symmetric.  DIAGNOSTIC DATA (LABS, IMAGING, TESTING) - ASSESSMENT AND PLAN  70 y.o. year old male  has a past medical history of BPH (benign prostatic hyperplasia); [id number]) (02/10/2013); Degenerative arthritis; Asthma; Heart murmur; and Common migraine (06/21/2014). here to follow up. His migraines are in excellent control . He is wanting to attempt to taper his Topamax again, this has been done previously but his headaches returned.   PLAN: Decrease Topamax by 25 mg for 1 week and if tolerated discontinue altogether If headaches return start back at 25 mg for 1 week then increase to 2 tabs daily Will refill today in case this does not work Use Advil prn F/U 6 months next with Dr. Jerline Pain, [facility], [facility], [facility] [address] [place], [place] [address] [phone]

## 2015-12-28 NOTE — Patient Instructions (Addendum)
Decrease Topamax by 25 mg for 1 week and if tolerated discontinue altogether If headaches return start back at 25 mg for 1 week then increase to 2 tabs daily Will refill Use Advil prn F/U 6 months next with Dr. [name]

## 2015-12-28 NOTE — Progress Notes (Signed)
I have read the note, and I agree with the clinical assessment and plan.  Damean Poffenberger KEITH   

## 2016-01-11 DIAGNOSIS — Z23 Encounter for immunization: Secondary | ICD-10-CM | POA: Diagnosis not present

## 2016-01-20 DIAGNOSIS — R0789 Other chest pain: Secondary | ICD-10-CM | POA: Diagnosis not present

## 2016-01-20 DIAGNOSIS — R079 Chest pain, unspecified: Secondary | ICD-10-CM | POA: Diagnosis not present

## 2016-01-20 DIAGNOSIS — R030 Elevated blood-pressure reading, without diagnosis of hypertension: Secondary | ICD-10-CM | POA: Diagnosis not present

## 2016-01-20 DIAGNOSIS — Z87891 Personal history of nicotine dependence: Secondary | ICD-10-CM | POA: Diagnosis not present

## 2016-01-21 DIAGNOSIS — R079 Chest pain, unspecified: Secondary | ICD-10-CM | POA: Diagnosis not present

## 2016-01-21 DIAGNOSIS — R001 Bradycardia, unspecified: Secondary | ICD-10-CM | POA: Diagnosis not present

## 2016-01-21 DIAGNOSIS — R9431 Abnormal electrocardiogram [ECG] [EKG]: Secondary | ICD-10-CM | POA: Diagnosis not present

## 2016-01-21 DIAGNOSIS — R072 Precordial pain: Secondary | ICD-10-CM | POA: Diagnosis not present

## 2016-01-21 DIAGNOSIS — R030 Elevated blood-pressure reading, without diagnosis of hypertension: Secondary | ICD-10-CM | POA: Diagnosis not present

## 2016-02-06 DIAGNOSIS — N401 Enlarged prostate with lower urinary tract symptoms: Secondary | ICD-10-CM | POA: Diagnosis not present

## 2016-02-06 DIAGNOSIS — R972 Elevated prostate specific antigen [PSA]: Secondary | ICD-10-CM | POA: Diagnosis not present

## 2016-02-06 DIAGNOSIS — R351 Nocturia: Secondary | ICD-10-CM | POA: Diagnosis not present

## 2016-02-06 DIAGNOSIS — R350 Frequency of micturition: Secondary | ICD-10-CM | POA: Diagnosis not present

## 2016-02-22 DIAGNOSIS — H401321 Pigmentary glaucoma, left eye, mild stage: Secondary | ICD-10-CM | POA: Diagnosis not present

## 2016-03-29 DIAGNOSIS — R972 Elevated prostate specific antigen [PSA]: Secondary | ICD-10-CM | POA: Diagnosis not present

## 2016-03-29 DIAGNOSIS — C61 Malignant neoplasm of prostate: Secondary | ICD-10-CM | POA: Diagnosis not present

## 2016-04-08 ENCOUNTER — Other Ambulatory Visit: Payer: Self-pay | Admitting: Urology

## 2016-04-08 DIAGNOSIS — R972 Elevated prostate specific antigen [PSA]: Secondary | ICD-10-CM | POA: Diagnosis not present

## 2016-04-08 DIAGNOSIS — C61 Malignant neoplasm of prostate: Secondary | ICD-10-CM | POA: Diagnosis not present

## 2016-04-22 DIAGNOSIS — Z923 Personal history of irradiation: Secondary | ICD-10-CM

## 2016-04-22 HISTORY — DX: Personal history of irradiation: Z92.3

## 2016-04-23 ENCOUNTER — Encounter (HOSPITAL_COMMUNITY)
Admission: RE | Admit: 2016-04-23 | Discharge: 2016-04-23 | Disposition: A | Discharge status: Home / Self Care | Payer: Medicare Other | Source: Ambulatory Visit | Attending: Urology | Admitting: Urology

## 2016-04-23 DIAGNOSIS — C61 Malignant neoplasm of prostate: Secondary | ICD-10-CM | POA: Diagnosis not present

## 2016-04-23 MED ORDER — TECHNETIUM TC 99M MEDRONATE IV KIT
21.7000 | PACK | Freq: Once | INTRAVENOUS | Status: AC | PRN
  Administered 2016-04-23: 21.7 via INTRAVENOUS

## 2016-04-29 DIAGNOSIS — R972 Elevated prostate specific antigen [PSA]: Secondary | ICD-10-CM | POA: Diagnosis not present

## 2016-04-30 ENCOUNTER — Encounter: Payer: Self-pay | Admitting: Radiation Oncology

## 2016-05-21 ENCOUNTER — Encounter: Payer: Self-pay | Admitting: Radiation Oncology

## 2016-05-21 NOTE — Progress Notes (Signed)
GU Location of Tumor / Histology: prostatic adenocarcinoma  If Prostate Cancer, Gleason Score is (4 + 5) and PSA is (5.06) on 11/2015. Prostate volume: 74.51 grams.  Dylan Woods presented 02/06/16 as a referral from Dr. Quillian Quince Crummitt with [facility] to Dr. Pilar Jarvis for an evaluation of an elevated PSA of 4.90. Patient's PSA was 2.31 in August 2016.   Biopsies of prostate (if applicable) revealed:    Past/Anticipated interventions by urology, if any: biopsy, referral to Dr. [name], referral for second opinion Dr. Darcus Austin and Dr. Kevin Fenton  Past/Anticipated interventions by medical oncology, if any: no  Weight changes, if any: no  Bowel/Bladder complaints, if any: difficulty emptying his bladder, urinary frequency, intermittent urine stream, weak urine stream, urinary urgency, and nocturia x 3   Nausea/Vomiting, if any: no  Pain issues, if any:  no  SAFETY ISSUES:  Prior radiation? no  Pacemaker/ICD? no  Possible current pregnancy? no  Is the patient on methotrexate? no  Current Complaints / other details:  71 year old male. Widowed. Retired Forensic psychologist. Brother with hx of prostate ca s/p prostatectomy. Mother with hx of lymphoma and bladder ca. Patient is leaning toward radiotherapy. Pilar Jarvis has arranged for patient to obtain a second opinion with Dr. Darcus Austin and Dr. Truman Hayward on 06-25-2022. Wife passed away of breast ca. Follow up with [facility] Tuesday.

## 2016-05-22 ENCOUNTER — Encounter: Payer: Self-pay | Admitting: Radiation Oncology

## 2016-05-22 ENCOUNTER — Ambulatory Visit
Admission: RE | Admit: 2016-05-22 | Discharge: 2016-05-22 | Disposition: A | Discharge status: Home / Self Care | Payer: Medicare Other | Source: Ambulatory Visit | Attending: Radiation Oncology | Admitting: Radiation Oncology

## 2016-05-22 VITALS — BP 135/80 | HR 59 | Resp 16 | Ht 72.0 in | Wt 176.2 lb

## 2016-05-22 DIAGNOSIS — R002 Palpitations: Secondary | ICD-10-CM | POA: Diagnosis not present

## 2016-05-22 DIAGNOSIS — C61 Malignant neoplasm of prostate: Secondary | ICD-10-CM

## 2016-05-22 DIAGNOSIS — R000 Tachycardia, unspecified: Secondary | ICD-10-CM | POA: Diagnosis not present

## 2016-05-22 DIAGNOSIS — Z79899 Other long term (current) drug therapy: Secondary | ICD-10-CM | POA: Diagnosis not present

## 2016-05-22 DIAGNOSIS — Z8042 Family history of malignant neoplasm of prostate: Secondary | ICD-10-CM | POA: Insufficient documentation

## 2016-05-22 DIAGNOSIS — Z87891 Personal history of nicotine dependence: Secondary | ICD-10-CM | POA: Insufficient documentation

## 2016-05-22 DIAGNOSIS — Z807 Family history of other malignant neoplasms of lymphoid, hematopoietic and related tissues: Secondary | ICD-10-CM | POA: Diagnosis not present

## 2016-05-22 HISTORY — DX: Malignant neoplasm of prostate: C61

## 2016-05-22 NOTE — Progress Notes (Signed)
Radiation Oncology         ([phone]) [phone] ________________________________  Initial outpatient Consultation  Name: Dylan Woods MRN: [id number]  Date: 05/22/2016  DOB: 1945-11-13  [id number], Perry Mount, NP  [name], MD   REFERRING PHYSICIAN: [name], MD  DIAGNOSIS: The encounter diagnosis was Malignant neoplasm of prostate [facility]).  71 year-old gentleman with T1c, adenocarcinoma of the prostate with a PSA of 5.06, and a Gleason Score of 4+5.     ICD-9-CM ICD-10-CM   1. Malignant neoplasm of prostate ([place]) 185 C61     HISTORY OF PRESENT ILLNESS: Dylan Woods is a 71 y.o. male with prostate cancer. He was noted to have an elevated PSA of 5.06 by his primary care physician, Dr. Reola Calkins.  Accordingly, he was referred for evaluation in urology by Dr. Pilar Jarvis on 02/06/2016,  digital rectal examination was performed at that time revealing right lobe hard, enlarged at 2+ size, but without nodules.  The patient proceeded to transrectal ultrasound with 12 biopsies of the prostate on 03/29/2016.  The prostate volume measured 74.51 cc.  Out of 12 core biopsies, 6 were positive.  The maximum Gleason score was 4+5, and this was seen in left mid lateral, left base, and left apex.  CT Pelvis with contrast on 04/23/2016 showed no enlarged, irregular prostate gland and no evidence for lymphadenopathy in the pelvis. There was a small sclerotic focus left ischial tuberosity, nonspecific. Small left inguinal hernia contains only fat. Bone scan on 04/23/2016 showed no evidence of metastatic disease and probable degenerative changes bilateral shoulders and bilateral knee.  The patient reviewed the biopsy results with his urologist and he has kindly been referred today for discussion of potential radiation treatment options. He's seeing Dr. Pilar Jarvis  PREVIOUS RADIATION THERAPY: No  PAST MEDICAL HISTORY:  Past Medical History:  Diagnosis Date  . Asthma   . BPH (benign prostatic  hyperplasia)   . Chronic headaches   . Common migraine 06/21/2014  . Degenerative arthritis   . Headache(784.0) 02/10/2013  . Heart murmur   . Prostate cancer ([place])       PAST SURGICAL HISTORY: Past Surgical History:  Procedure Laterality Date  . Arthroscopic knee surgery Left   . CATARACT EXTRACTION Bilateral   . INGUINAL HERNIA REPAIR Right   . NASAL SINUS SURGERY    . PROSTATE BIOPSY      FAMILY HISTORY:  Family History  Problem Relation Age of Onset  . Parkinsonism Father   . Cancer Mother     lymphoma  . Cancer Brother     prostate ca s/p prostatectomy    SOCIAL HISTORY:  Social History   Social History  . Marital status: Widowed    Spouse name: N/A  . Number of children: 1  . Years of education: college   Occupational History  .  [employer] History Main Topics  . Smoking status: Former Smoker    Types: Cigarettes  . Smokeless tobacco: Never Used     Comment: Quit when he was 60  . Alcohol use 0.0 oz/week     Comment: moderate level  . Drug use: No  . Sexual activity: Not Currently   Other Topics Concern  . Not on file   Social History Narrative   Patient lives at home alone widowed.   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   Right handed.  College education.   Caffeine one cup daily coffee.    ALLERGIES: Ampicillin  MEDICATIONS:  Current Outpatient Prescriptions  Medication Sig Dispense Refill  . acetaminophen (TYLENOL) 325 MG tablet Take 650 mg by mouth every 6 (six) hours as needed.    Marland Kitchen acetaminophen-codeine (TYLENOL #3) 300-30 MG per tablet Take 1 tablet by mouth every 4 (four) hours as needed for pain.    Marland Kitchen acyclovir (ZOVIRAX) 200 MG capsule Take 200 mg by mouth daily as needed. For cold sores    . ALPRAZolam (XANAX) 0.25 MG tablet Take 0.25 mg by mouth as needed for sleep (Taking when going on air flights).     . Ascorbic Acid (VITAMIN C) 1000 MG tablet Take 1,000 mg  by mouth daily.    . Calcium Ascorbate 500 MG TABS Take 1,000 mg by mouth daily.     Marland Kitchen ibuprofen (ADVIL,MOTRIN) 200 MG tablet Take 200 mg by mouth every 6 (six) hours as needed.    . Multiple Vitamin (MULTIVITAMIN) tablet Take 1 tablet by mouth daily.    . Potassium Gluconate 2 MEQ TABS Take by mouth.    . tamsulosin (FLOMAX) 0.4 MG CAPS capsule Take 0.4 mg by mouth.    . topiramate (TOPAMAX) 50 MG tablet Take by mouth.    . valACYclovir (VALTREX) 1000 MG tablet      Current Facility-Administered Medications  Medication Dose Route Frequency Provider Last Rate Last Dose  . methylPREDNISolone sodium succinate (SOLU-MEDROL) 375 mg in sodium chloride 0.9 % 100 mL IVPB  375 mg Intravenous Continuous Dennie Bible, NP   Stopped at 04/11/14 1500    REVIEW OF SYSTEMS:  On review of systems, the patient reports that he is doing well overall. He denies any chest pain, shortness of breath, cough, fevers, chills, night sweats, unintended weight changes. He denies any bowel disturbances, and denies abdominal pain, nausea or vomiting. He reports difficulty emptying his bladder, urinary frequency, intermittent urine stream, weak urine stream, urinary frequency, and nocturia x 3. He denies any new musculoskeletal or joint aches or pains. He reports increased heart palpitations associated with anxiety and caffeine-intake. He [place] episode at the beach where his heart race increased and he felt light-headed. He went to the ED and was cleared. He denies any episodes of scyncope. IPSS score is 24 indicating severe urinary symptoms. He is able to complete sexual activity with most attempts. A complete review of systems is obtained and is otherwise negative.    PHYSICAL EXAM:  Wt Readings from Last 3 Encounters:  05/22/16 176 lb 3.2 oz (79.9 kg)  12/28/15 179 lb 3.2 oz (81.3 kg)  06/27/15 179 lb 9.6 oz (81.5 kg)   Temp Readings from Last 3 Encounters:  09/27/15 97 F (36.1 C) (Oral)  09/23/13 97.8 F  (36.6 C)   BP Readings from Last 3 Encounters:  05/22/16 135/80  12/28/15 128/74  09/27/15 130/76   Pulse Readings from Last 3 Encounters:  05/22/16 (!) 59  12/28/15 60  09/27/15 61    Pain Scale 0/10 In general this is a well appearing Caucasian male in no acute distress. He is alert and oriented x4 and appropriate throughout the examination. HEENT reveals that the patient is normocephalic, atraumatic. EOMs are intact. PERRLA. Skin is intact without any evidence of gross lesions. Cardiovascular exam reveals a regular rate and rhythm, no clicks rubs or murmurs are auscultated. Chest is clear to auscultation bilaterally. Lymphatic assessment is performed and does not reveal any adenopathy in  the cervical, supraclavicular, axillary, or inguinal chains. Abdomen has active bowel sounds in all quadrants and is intact. The abdomen is soft, non tender, non distended. Lower extremities are negative for pretibial pitting edema, deep calf tenderness, cyanosis or clubbing.   KPS =100  100 - Normal; no complaints; no evidence of disease. 90   - Able to carry on normal activity; minor signs or symptoms of disease. 80   - Normal activity with effort; some signs or symptoms of disease. 57   - Cares for self; unable to carry on normal activity or to do active work. 60   - Requires occasional assistance, but is able to care for most of his personal needs. 50   - Requires considerable assistance and frequent medical care. 80   - Disabled; requires special care and assistance. 16   - Severely disabled; hospital admission is indicated although death not imminent. 64   - Very sick; hospital admission necessary; active supportive treatment necessary. 10   - Moribund; fatal processes progressing rapidly. 0     - Dead  Karnofsky DA, Abelmann WH, Craver LS and Burchenal JH [phone]) The use of the nitrogen mustards in the palliative treatment of carcinoma: with particular reference to bronchogenic carcinoma  Cancer 1 634-56  LABORATORY DATA:  No results found for: WBC, HGB, HCT, MCV, PLT No results found for: NA, K, CL, CO2 No results found for: ALT, AST, GGT, ALKPHOS, BILITOT   RADIOGRAPHY: Nm Bone Scan Whole Body  Result Date: 04/23/2016 CLINICAL DATA:  Prostate cancer EXAM: NUCLEAR MEDICINE WHOLE BODY BONE SCAN TECHNIQUE: Whole body anterior and posterior images were obtained approximately 3 hours after intravenous injection of radiopharmaceutical. RADIOPHARMACEUTICALS:  21.7 mCi Technetium-51m MDP IV COMPARISON:  CT pelvis 04/23/2016 FINDINGS: A whole-body bone scan was performed. There is homogeneous uptake of the tracer within bony structures without abnormal foci of increased activity to suggest metastatic disease. Symmetrical mild increased activity bilateral shoulders and bilateral knee medially most likely degenerative in nature. IMPRESSION: No evidence of metastatic disease. Probable degenerative changes bilateral shoulders and bilateral knee. Electronically Signed   By: Lahoma Crocker [name]   On: 04/23/2016 15:23      IMPRESSION/PLAN: 1. 71 y.o. gentleman with a high risk T1c, adenocarcinoma of the prostate with a PSA of 5.06, and a Gleason Score of 4+5. We discussed the natural history of high risk disease, and discussed options for treatment, and reviewed the the implications of T-stage, Gleason's Score, and PSA make on decision-making and outcomes in prostate cancer.  We have discussed options for surgery, though he has a risk of about 33% of extraprostatic extension based on Partin Tables, and that he could need adjuvant or salvage radiotherapy if he chose surgery. In addition, he's over 59, and may not be the best candidate for surgical intervention given a concern for a new cardiology condition as well. We discussed radiation treatment options that are available including 8 weeks of external radiotherapy versus 5 weeks of external radiotherapy plus a radioactive seed implant as a boost, and in  both cases the role of 2 years of  ADT. Although he is not a candidate at this very moment for seed implant boost due to gland size and IPSS scores, we did discuss that if he was motivated to become a candidate, we could consider reimaging his prostate 4 months into ADT to see if his gland could be downsized well enough for this. We reviewed the risks, benefits, short, and long term effects of  radiotherapy in each of these instances. Of note we also discussed options for hypofractionation, however given his urinary symptoms, Dr. [name] would not recommend this. At the end of the discussion, the patient is interested in keeping his appointment at [facility] with Dr. Darcus Austin and Dr. Truman Hayward on February 20th for a second opinion, and we will touch base following this visit to discuss his decision to move forward. 2. New onset heart palpitations with tachycardia. Although the patient associates this with anxiety and caffeine-intake, modifications in his caffeine consumption have not altered his symptoms. He did have an ED work up which was negative, but I have suggested that we refer him for cardiology evaluation to determine if he needs to have further work up of this with intervention like a Holter monitor. He is in agreement and we will see if Dr. Radford Pax or [name] can see him.    Carola Rhine, PAC  And    [name], MD [place] Director and Director of Stereotactic Radiosurgery Direct Dial: [phone]  Fax: [phone] [email or web address]  Skype  [email or web address]     This document serves as a record of services personally performed by [name], MD and [name], PAC. It was created on their behalf by Arlyce Harman, a trained medical scribe. The creation of this record is based on the scribe's personal observations and the provider's statements to them. This document has been checked and approved by the attending provider.

## 2016-05-22 NOTE — Progress Notes (Signed)
See progress note under physician encounter. 

## 2016-05-24 ENCOUNTER — Telehealth: Payer: Self-pay | Admitting: *Deleted

## 2016-05-24 NOTE — Telephone Encounter (Signed)
CALLED PATIENT TO INFORM OF APPT. WITH [name] ON 06-17-16 - ARRIVAL TIME - 2:15 PM, ADDRESS - [place] LVM FOR A RETURN CALL.

## 2016-05-28 DIAGNOSIS — R972 Elevated prostate specific antigen [PSA]: Secondary | ICD-10-CM | POA: Diagnosis not present

## 2016-05-28 DIAGNOSIS — C61 Malignant neoplasm of prostate: Secondary | ICD-10-CM | POA: Diagnosis not present

## 2016-06-04 ENCOUNTER — Telehealth: Payer: Self-pay | Admitting: Medical Oncology

## 2016-06-04 ENCOUNTER — Telehealth: Payer: Self-pay | Admitting: Radiation Oncology

## 2016-06-04 DIAGNOSIS — C61 Malignant neoplasm of prostate: Secondary | ICD-10-CM | POA: Diagnosis not present

## 2016-06-04 NOTE — Telephone Encounter (Signed)
Encounter opened in error. Documentation complete under a different encounter.

## 2016-06-04 NOTE — Telephone Encounter (Signed)
Patient left message inquiring if "slides" had been sent by our office to [facility] in preparation for his consultation with them. Unsure of what the patient is referring to I phoned him back to inquire. No answer. Left message requesting return call.

## 2016-06-04 NOTE — Progress Notes (Signed)
I called Mr. Dylan Woods and introduced myself as the prostate navigator and my role. I was not able to meet him the day he consulted with Dr. [name]. He states that he is going to [facility] for a second opinion with Dr. Darcus Austin. He was very pleased with Dr. [name] and he if he receives radiation he will be treated here in [place]. I asked him to call me with any questions or concerns and will follow during radiation.

## 2016-06-04 NOTE — Telephone Encounter (Signed)
Per Cherlyn Cushing in pathology at [facility]. [facility] mailed slides to [facility] on 05/31/16. Phoned patient and informed him of this finding. Patient verbalized understanding and expressed great appreciation for the call back.

## 2016-06-07 ENCOUNTER — Encounter: Payer: Self-pay | Admitting: *Deleted

## 2016-06-11 DIAGNOSIS — C61 Malignant neoplasm of prostate: Secondary | ICD-10-CM | POA: Diagnosis not present

## 2016-06-17 ENCOUNTER — Ambulatory Visit (INDEPENDENT_AMBULATORY_CARE_PROVIDER_SITE_OTHER): Payer: Medicare Other | Admitting: Cardiology

## 2016-06-17 ENCOUNTER — Telehealth (HOSPITAL_COMMUNITY): Payer: Self-pay | Admitting: *Deleted

## 2016-06-17 ENCOUNTER — Telehealth: Payer: Self-pay | Admitting: Radiation Oncology

## 2016-06-17 ENCOUNTER — Encounter: Payer: Self-pay | Admitting: Cardiology

## 2016-06-17 VITALS — BP 124/80 | HR 61 | Ht 73.0 in | Wt 182.0 lb

## 2016-06-17 DIAGNOSIS — C61 Malignant neoplasm of prostate: Secondary | ICD-10-CM | POA: Diagnosis not present

## 2016-06-17 DIAGNOSIS — R002 Palpitations: Secondary | ICD-10-CM

## 2016-06-17 DIAGNOSIS — R079 Chest pain, unspecified: Secondary | ICD-10-CM

## 2016-06-17 DIAGNOSIS — I491 Atrial premature depolarization: Secondary | ICD-10-CM | POA: Diagnosis not present

## 2016-06-17 NOTE — Progress Notes (Signed)
Cardiology Office Note    Date:  06/17/2016   ID:  Dylan Woods, DOB 12-23-1945, MRN [id number]  PCP:  [name], NP  Cardiologist:   [name], MD   Chief Complaint  Patient presents with  . New Patient (Initial Visit)    palpitations/chest pain/SOB    History of Present Illness:  Dylan Woods is a 71 y.o. male here for the evaluation of palpitations, chest pain, shortness of breath.  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 He was being seen in the office of [name], [name] on 05/22/16 with some description of new onset heart palpitations with tachycardia. He associated them originally with anxiety and caffeine intake. Modifications and caffeine did not alter his symptoms. He was seen by [name], [name].   Pressure in chest, left side, pushing down. Sometines numbness in left arm, tingling in fingers. Can happen at rest. Wake up with them. Fleeting sharp pain at times.   Was walking [place] beach, [facility], head spinning, heart racing, fluttering. Sat down. Uber home. Heart would not stop running. ER [place]. Once he got to the emergency room, no evidence of any dangerous arrhythmias. He did have a treadmill test after overnight observation, negative troponin.  Less frequent episodes currently.   Father may have had heart issues. Mother no heart issues.  He just recently had a second opinion for his prostate cancer at [facility]. He is ready to undergo hormonal therapy here as well as radiation therapy.  He's not had any prior cardiovascular events. He remains quite active. He does describe arthritis of neck, hands, knees. Anxiety surrounding this issue.  Past Medical History:  Diagnosis Date  . Asthma   . BPH (benign prostatic hyperplasia)   . Chronic headaches   . Common migraine 06/21/2014  . Degenerative arthritis   . Headache(784.0) 02/10/2013  . Heart murmur   . Prostate cancer [facility])     Past Surgical History:  Procedure  Laterality Date  . Arthroscopic knee surgery Left   . CATARACT EXTRACTION Bilateral   . INGUINAL HERNIA REPAIR Right   . NASAL SINUS SURGERY    . PROSTATE BIOPSY      Current Medications: Outpatient Medications Prior to Visit  Medication Sig Dispense Refill  . acetaminophen (TYLENOL) 325 MG tablet Take 650 mg by mouth every 6 (six) hours as needed.    Marland Kitchen acetaminophen-codeine (TYLENOL #3) 300-30 MG per tablet Take 1 tablet by mouth every 4 (four) hours as needed for pain.    Marland Kitchen acyclovir (ZOVIRAX) 200 MG capsule Take 200 mg by mouth daily as needed. For cold sores    . ALPRAZolam (XANAX) 0.25 MG tablet Take 0.25 mg by mouth as needed for sleep (Taking when going on air flights).     . Ascorbic Acid (VITAMIN C) 1000 MG tablet Take 1,000 mg by mouth daily.    Marland Kitchen ibuprofen (ADVIL,MOTRIN) 200 MG tablet Take 200 mg by mouth every 6 (six) hours as needed.    . Multiple Vitamin (MULTIVITAMIN) tablet Take 1 tablet by mouth daily.    . Potassium Gluconate 2 MEQ TABS Take by mouth.    . tamsulosin (FLOMAX) 0.4 MG CAPS capsule Take 0.4 mg by mouth.    . topiramate (TOPAMAX) 50 MG tablet Take by mouth.    . valACYclovir (VALTREX) 1000 MG tablet     . Calcium Ascorbate 500 MG TABS Take 1,000 mg by mouth daily.      Facility-Administered Medications Prior  to Visit  Medication Dose Route Frequency Provider Last Rate Last Dose  . methylPREDNISolone sodium succinate (SOLU-MEDROL) 375 mg in sodium chloride 0.9 % 100 mL IVPB  375 mg Intravenous Continuous Dennie Bible, NP   Stopped at 04/11/14 1500     Allergies:   Ampicillin   Social History   Social History  . Marital status: Widowed    Spouse name: N/A  . Number of children: 1  . Years of education: college   Occupational History  .  [employer] History Main Topics  . Smoking status: Former Smoker    Types: Cigarettes  . Smokeless tobacco: Never Used     Comment: Quit when he was 1    . Alcohol use 0.0 oz/week     Comment: moderate level  . Drug use: No  . Sexual activity: Not Currently   Other Topics Concern  . None   Social History Narrative   Patient lives at home alone widowed.   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   Right handed.   College education.   Caffeine one cup daily coffee.     Family History:  The patient's family history includes Cancer in his brother and mother; Parkinsonism in his father.   ROS:   Please see the history of present illness.    ROS All other systems reviewed and are negative.   PHYSICAL EXAM:   VS:  BP 124/80   Pulse 61   Ht 6\' 1"  (1.854 m)   Wt 182 lb (82.6 kg)   BMI 24.01 kg/m    GEN: Well nourished, well developed, in no acute distress  HEENT: normal  Neck: no JVD, carotid bruits, or masses Cardiac: RRR; no murmurs, rubs, or gallops,no edema  Respiratory:  clear to auscultation bilaterally, normal work of breathing GI: soft, nontender, nondistended, + BS MS: no deformity or atrophy  Skin: warm and dry, no rash Neuro:  Alert and Oriented x 3, Strength and sensation are intact Psych: euthymic mood, full affect  Wt Readings from Last 3 Encounters:  06/17/16 182 lb (82.6 kg)  05/22/16 176 lb 3.2 oz (79.9 kg)  12/28/15 179 lb 3.2 oz (81.3 kg)      Studies/Labs Reviewed:   EKG:  EKG is ordered today.  The ekg ordered today demonstrates 06/17/16-sinus rhythm with PAC heartbeat 61 bpm. No other abnormalities. Personally viewed.  Recent Labs: No results found for requested labs within last 8760 hours.   Lipid Panel No results found for: CHOL, TRIG, HDL, CHOLHDL, VLDL, LDLCALC, LDLDIRECT  Additional studies/ records that were reviewed today include:  Prior office notes, lab work, EKG reviewed    ASSESSMENT:    1. Chest pain, unspecified type   2. Palpitations   3. PAC (premature atrial contraction)   4. Prostate cancer [facility])      PLAN:  In order of problems listed  above:  Atypical chest pain  - Sometimes does experience left-sided chest pressure when waking up. He is not been experiencing this however during exertional activity. He also has some tingling in his left hand sometimes left arm. Could be neurologic, could be musculoskeletal, GERD however given his age and symptoms, we will proceed with nuclear stress test. He had a exercise treadmill test performed in [facility] which was reportedly normal. If his symptoms worsen or begin more worrisome, further cardiac testing may be warranted. He knows  to contact us if symptoms worsen.  Palpitations  - Racing heartbeat noted, PAC noted on ECG. Differential diagnosis is paroxysmal atrial tachycardia, PSVT, sinus tachycardia, atrial fibrillation, ventricular tachycardia. These flutter-like symptoms seem to be decreasing in severity and frequency. We discussed possibly placing an event monitor for 30 days to try to catch one of these arrhythmias in action however at this time given his further testing/treatment with prostate cancer and his decreased frequency of symptoms, we have mutually decided to forego event monitor if his symptoms return, he will let me know. I discussed potential maneuvers to help break tachycardias if this is a reentrant tachycardia such as Valsalva maneuver, laying down. He has already decreased his caffeine intake and has substantially reduced his overall alcohol intake although this was never extremely elevated. We discussed treatments at length.  Prostate cancer  - Currently about to undergo hormonal therapy and then transition to radiation therapy.       Medication Adjustments/Labs and Tests Ordered: Current medicines are reviewed at length with the patient today.  Concerns regarding medicines are outlined above.  Medication changes, Labs and Tests ordered today are listed in the Patient Instructions below. Patient Instructions  Medication Instructions:  The current medical regimen  is effective;  continue present plan and medications.  Testing/Procedures: Your physician has requested that you have a myoview. For further information please visit [email or web address]. Please follow instruction sheet, as given.  Follow-Up: Follow up as needed with Dr Marlou Porch after testing.  Thank you for choosing [facility]!!        Signed, [name], MD  06/17/2016 3:44 PM    [address], [place], [place]  [address] Phone: [phone]; Fax: [phone]

## 2016-06-17 NOTE — Telephone Encounter (Addendum)
LM for the patient to call us back to discuss his plans for prostate treatment.

## 2016-06-17 NOTE — Telephone Encounter (Signed)
Left message on voicemail in reference to upcoming appointment scheduled for 06/19/16. Phone number given for a call back so details instructions can be given. Tristin Gladman, Ranae Palms

## 2016-06-17 NOTE — Telephone Encounter (Signed)
Left another message for the patient to discuss his care.

## 2016-06-17 NOTE — Patient Instructions (Signed)
Medication Instructions:  The current medical regimen is effective;  continue present plan and medications.  Testing/Procedures: Your physician has requested that you have a myoview. For further information please visit [email or web address]. Please follow instruction sheet, as given.  Follow-Up: Follow up as needed with Dr Marlou Porch after testing.  Thank you for choosing [facility]!!

## 2016-06-18 ENCOUNTER — Telehealth: Payer: Self-pay | Admitting: Cardiology

## 2016-06-18 ENCOUNTER — Telehealth: Payer: Self-pay | Admitting: Radiation Oncology

## 2016-06-18 NOTE — Telephone Encounter (Signed)
Follow Up   Per pt returning phone call from nurse regarding myocardial perfusion. Requesting call back

## 2016-06-18 NOTE — Telephone Encounter (Signed)
I spoke with the patient to review is visit with the physician's at [facility]. Patient is interested in moving forward with androgen deprivation for 2 years with radiotherapy. He has a gland size of 74 mL, and a knife he has a score the severe category. He is however interested in trying to find out of his gland can be down in size, and his urinary symptoms improve, B, candidate for seed implant has a boost. What we will do is planned to follow up by phone 1 month after his androgen deprivation injection has been given to see if his urinary symptoms are improving at all, at 2 months, we will make a decision clinically based on his urinary symptoms to determine how he wants to move forward, either with 8 weeks of external radiotherapy that interval, versus waiting a total of 4 months since starting androgen deprivation to reassess his gland size by ultrasound and then potentially move forward with 5 weeks of external radiotherapy plus a seed boost. We also discussed the use of work, which he is interested in learning more about. I will check in 1 month's time.

## 2016-06-19 ENCOUNTER — Ambulatory Visit (HOSPITAL_COMMUNITY): Payer: Medicare Other | Attending: Cardiovascular Disease

## 2016-06-19 DIAGNOSIS — R079 Chest pain, unspecified: Secondary | ICD-10-CM | POA: Insufficient documentation

## 2016-06-19 DIAGNOSIS — R9439 Abnormal result of other cardiovascular function study: Secondary | ICD-10-CM | POA: Insufficient documentation

## 2016-06-19 LAB — MYOCARDIAL PERFUSION IMAGING
CHL CUP MPHR: 147 {beats}/min
CSEPEDS: 0 s
Estimated workload: 11.7 METS
Exercise duration (min): 10 min
LV dias vol: 116 mL (ref 62–150)
LVSYSVOL: 60 mL
NUC STRESS TID: 0.99
Peak HR: 131 {beats}/min
Percent HR: 89 %
RATE: 0.3
Rest HR: 57 {beats}/min
SDS: 5
SRS: 5
SSS: 8

## 2016-06-19 MED ORDER — TECHNETIUM TC 99M TETROFOSMIN IV KIT
10.2000 | PACK | Freq: Once | INTRAVENOUS | Status: AC | PRN
  Administered 2016-06-19: 10.2 via INTRAVENOUS
  Filled 2016-06-19: qty 11

## 2016-06-19 MED ORDER — TECHNETIUM TC 99M TETROFOSMIN IV KIT
32.5000 | PACK | Freq: Once | INTRAVENOUS | Status: AC | PRN
  Administered 2016-06-19: 32.5 via INTRAVENOUS
  Filled 2016-06-19: qty 33

## 2016-06-24 ENCOUNTER — Telehealth: Payer: Self-pay | Admitting: Cardiology

## 2016-06-24 NOTE — Telephone Encounter (Signed)
I spoke with the pt and he just found out that he needs to start Lupron ASAP.  The pt is scheduled to see his urologist tomorrow morning at 9:45 and was just made aware that they will be instructing him about this medication at this appt. The pt has looked up side effects and he would like to get Dr Kingsley Plan thoughts whether he can take this medication due to risk of heart attack.  I will forward this message to Dr Kingsley Plan for ASAP review.

## 2016-06-24 NOTE — Telephone Encounter (Signed)
I think it would be reasonable for him to try the Lupron. Risk of heart attack would be very small. I would feel comfortable with him giving the medication a chance [name], MD

## 2016-06-24 NOTE — Telephone Encounter (Signed)
I spoke with the pt and made him aware of Dr Kingsley Plan comments.

## 2016-06-24 NOTE — Telephone Encounter (Signed)
New Message     Pt has been diagnosed with prostate cancer, his oncologist wants to treat him with Lupron, is he at risk for heart attack if he takes this medication ???

## 2016-06-25 DIAGNOSIS — R972 Elevated prostate specific antigen [PSA]: Secondary | ICD-10-CM | POA: Diagnosis not present

## 2016-06-25 DIAGNOSIS — C61 Malignant neoplasm of prostate: Secondary | ICD-10-CM | POA: Diagnosis not present

## 2016-06-27 ENCOUNTER — Telehealth: Payer: Self-pay | Admitting: Radiation Oncology

## 2016-06-27 ENCOUNTER — Ambulatory Visit (INDEPENDENT_AMBULATORY_CARE_PROVIDER_SITE_OTHER): Payer: Medicare Other | Admitting: Neurology

## 2016-06-27 ENCOUNTER — Encounter: Payer: Self-pay | Admitting: Neurology

## 2016-06-27 VITALS — BP 123/68 | HR 63 | Ht 73.0 in | Wt 181.5 lb

## 2016-06-27 DIAGNOSIS — G43019 Migraine without aura, intractable, without status migrainosus: Secondary | ICD-10-CM | POA: Diagnosis not present

## 2016-06-27 MED ORDER — TOPIRAMATE 25 MG PO TABS: 75.0000 mg | ORAL_TABLET | Freq: Every day | ORAL | 5 refills | Status: DC

## 2016-06-27 NOTE — Telephone Encounter (Signed)
I spoke with the patient today. He had a lupron injection on 3/6 that was the 30 mg strength to cover for four months. We will touch base in one month to see how he's doing and see how his urinary symptoms are to determine if we continue to anticipate that he may have downsizing of his prostate to become a candidate for seed implant as a boost to external radiation. He will call sooner with questions or concerns.

## 2016-06-27 NOTE — Progress Notes (Signed)
Reason for visit: Migraine headache  Dylan Woods is an 71 y.o. male  History of present illness:  Dylan Woods is a 71 year old right-handed white male with a history of migraine headache. The patient has done relatively well with the Topamax, he has tried to come off of the Topamax with return of the headache. The patient is still taking Advil daily as he feels like a headache might come on, but never turns into a severe headache. He has recently been diagnosed with prostate cancer, he just received his first injection of Lupron 3 days ago. He returns for further evaluation. He has not been having any severe headache issues.  Past Medical History:  Diagnosis Date  . Asthma   . BPH (benign prostatic hyperplasia)   . Chronic headaches   . Common migraine 06/21/2014  . Degenerative arthritis   . Headache(784.0) 02/10/2013  . Heart murmur   . Prostate cancer [facility])     Past Surgical History:  Procedure Laterality Date  . Arthroscopic knee surgery Left   . CATARACT EXTRACTION Bilateral   . INGUINAL HERNIA REPAIR Right   . NASAL SINUS SURGERY    . PROSTATE BIOPSY      Family History  Problem Relation Age of Onset  . Parkinsonism Father   . Cancer Mother     lymphoma  . Cancer Brother     prostate ca s/p prostatectomy    Social history:  reports that he has quit smoking. His smoking use included Cigarettes. He has never used smokeless tobacco. He reports that he drinks alcohol. He reports that he does not use drugs.    Allergies  Allergen Reactions  . Ampicillin Rash    Medications:  Prior to Admission medications   Medication Sig Start Date End Date Taking? Authorizing Provider  acetaminophen (TYLENOL) 325 MG tablet Take 650 mg by mouth every 6 (six) hours as needed.   Yes Historical Provider, MD  acetaminophen-codeine (TYLENOL #3) 300-30 MG per tablet Take 1 tablet by mouth every 4 (four) hours as needed for pain.   Yes Historical Provider, MD  acyclovir (ZOVIRAX)  200 MG capsule Take 200 mg by mouth daily as needed. For cold sores 07/19/13  Yes Historical Provider, MD  ALPRAZolam (XANAX) 0.25 MG tablet Take 0.25 mg by mouth as needed for sleep (Taking when going on air flights).    Yes Historical Provider, MD  Ascorbic Acid (VITAMIN C) 1000 MG tablet Take 1,000 mg by mouth daily.   Yes Historical Provider, MD  CALCIUM-VITAMIN D PO Take 1 tablet by mouth daily. 600-400mg  tablet   Yes Historical Provider, MD  ibuprofen (ADVIL,MOTRIN) 200 MG tablet Take 200 mg by mouth every 6 (six) hours as needed.   Yes Historical Provider, MD  Multiple Vitamin (MULTIVITAMIN) tablet Take 1 tablet by mouth daily.   Yes Historical Provider, MD  Potassium Gluconate 2 MEQ TABS Take by mouth.   Yes Historical Provider, MD  tamsulosin (FLOMAX) 0.4 MG CAPS capsule Take 0.4 mg by mouth.   Yes Historical Provider, MD  valACYclovir (VALTREX) 1000 MG tablet  11/13/15  Yes Historical Provider, MD  topiramate (TOPAMAX) 25 MG tablet Take 3 tablets (75 mg total) by mouth at bedtime. 06/27/16   Kathrynn Ducking, MD    ROS:  Out of a complete 14 system review of symptoms, the patient complains only of the following symptoms, and all other reviewed systems are negative.  Ringing in the ears Frequency of urination, urinary urgency Headache  Blood pressure 123/68, pulse 63, height 6\' 1"  (1.854 m), weight 181 lb 8 oz (82.3 kg).  Physical Exam  General: The patient is alert and cooperative at the time of the examination.  Skin: No significant peripheral edema is noted.   Neurologic Exam  Mental status: The patient is alert and oriented x 3 at the time of the examination. The patient has apparent normal recent and remote memory, with an apparently normal attention span and concentration ability.   Cranial nerves: Facial symmetry is present. Speech is normal, no aphasia or dysarthria is noted. Extraocular movements are full. Visual fields are full.  Motor: The patient has good strength  in all 4 extremities.  Sensory examination: Soft touch sensation is symmetric on the face, arms, and legs.  Coordination: The patient has good finger-nose-finger and heel-to-shin bilaterally.  Gait and station: The patient has a normal gait. Tandem gait is minimally unsteady. Romberg is negative. No drift is seen.  Reflexes: Deep tendon reflexes are symmetric.   Assessment/Plan:  1. Migraine headache  The patient will be going on Lupron, it is possible this may worsen his migraine headaches. The patient will go up on the Topamax taking 75 mg at night, he is having to take Advil on a daily basis for minor headaches. The patient will follow-up in one year, sooner if needed, we will make dose adjustments of the medication if needed.  Jill Alexanders MD 06/27/2016 10:34 AM  [facility] [address] [place], [place],  [address]  Phone [phone] Fax [phone]

## 2016-06-27 NOTE — Patient Instructions (Signed)
   We will go to 75 mg of topamax at night time, call for any dose adjustments of the medication.

## 2016-07-08 ENCOUNTER — Encounter: Payer: Self-pay | Admitting: Radiation Oncology

## 2016-07-09 DIAGNOSIS — D225 Melanocytic nevi of trunk: Secondary | ICD-10-CM | POA: Diagnosis not present

## 2016-07-09 DIAGNOSIS — L570 Actinic keratosis: Secondary | ICD-10-CM | POA: Diagnosis not present

## 2016-07-09 DIAGNOSIS — L814 Other melanin hyperpigmentation: Secondary | ICD-10-CM | POA: Diagnosis not present

## 2016-07-09 DIAGNOSIS — L820 Inflamed seborrheic keratosis: Secondary | ICD-10-CM | POA: Diagnosis not present

## 2016-07-09 DIAGNOSIS — L821 Other seborrheic keratosis: Secondary | ICD-10-CM | POA: Diagnosis not present

## 2016-07-12 ENCOUNTER — Telehealth: Payer: Self-pay | Admitting: Radiation Oncology

## 2016-07-12 NOTE — Telephone Encounter (Signed)
Received message from patient requesting return call. Phoned patient at number provided. No answer. Left message with my contact information.

## 2016-07-12 NOTE — Telephone Encounter (Signed)
Opened in error

## 2016-07-16 ENCOUNTER — Telehealth: Payer: Self-pay | Admitting: Radiation Oncology

## 2016-07-16 DIAGNOSIS — K640 First degree hemorrhoids: Secondary | ICD-10-CM | POA: Diagnosis not present

## 2016-07-16 DIAGNOSIS — Z8371 Family history of colonic polyps: Secondary | ICD-10-CM | POA: Diagnosis not present

## 2016-07-16 DIAGNOSIS — K5730 Diverticulosis of large intestine without perforation or abscess without bleeding: Secondary | ICD-10-CM | POA: Diagnosis not present

## 2016-07-16 NOTE — Telephone Encounter (Signed)
-----   Message from [name], [place] sent at 07/15/2016  7:51 AM EDT ----- Regarding: RE: Interested in [place] had some long discussion with this gentleman previously. Sam- if you can let him know I'll call him this week to discuss details? Thanks, Bryson Ha  ----- Message ----- From: [name], MD Sent: 07/12/2016   6:37 PM To: [name], [name], [name], MD, # Subject: RE: Interested in [place]                     The short answer is YES, we can consider SpaceOAR.  The complicated part is that we were thinking about seed implant boost.  So, [place] may be our first 'boost first' case, meaning.  Start hormone therapy.  After 8 weeks, go to OR with Budzyn/Manning for prostate seed implant, then gold markers, then SpaceOAR, followed by 5 weeks of external radiation.  MM    ----- Message ----- From: [name], RN Sent: 07/12/2016   5:28 PM To: [name], MD, [name], [name], # Subject: Interested in [place]                         Dr. [name].  I received a call today from Dylan Woods. He heard from a friend you are doing space oar. He is very interested and questions if this can be place at the same time his three fiducial makers are inserted. His urologist is [place]. He isn't scheduled to follow up with [facility] for a few more weeks. I told him I would phone him back on Monday with next steps.   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   Sam

## 2016-07-16 NOTE — Telephone Encounter (Signed)
Phoned patient to close loop and explained that [name], [name] would be in touch with him later this week to discuss [place] and timing of treatments in further detail. Patient verbalized understanding and expressed appreciation for the call.

## 2016-07-23 ENCOUNTER — Telehealth: Payer: Self-pay | Admitting: Radiation Oncology

## 2016-07-23 NOTE — Telephone Encounter (Signed)
I spent 30 minutes talking to the patient regarding his interest in still being a candidate for radioactive seed implant as well as for XRT and SpaceOAR. His pre-Lupron IPSS score was 24, and Lupron was given on 06/25/16. His gland size was 74 cc prior to Lupron as well. I will call him on 08/27/16 to check on his symptoms, and we will review IPSS scoring by phone for his urinary symptoms. He will call sooner with questions or concerns.

## 2016-08-28 ENCOUNTER — Telehealth: Payer: Self-pay | Admitting: Radiation Oncology

## 2016-08-28 NOTE — Telephone Encounter (Signed)
-----   Message from [name], [name] sent at 07/23/2016  9:33 AM EDT ----- Regarding: 08/27/16 I spent 30 minutes talking to the patient regarding his interest in still being a candidate for radioactive seed implant as well as for XRT and SpaceOAR. His pre-Lupron IPSS score was 24, and Lupron was given on 06/25/16. His gland size was 74 cc prior to Lupron as well. I will call him on 08/27/16 to check on his symptoms, and we will review IPSS scoring by phone for his urinary symptoms. He will call sooner with questions or concerns.

## 2016-08-28 NOTE — Telephone Encounter (Signed)
I spoke with the patient today reviewing his IPSS scores which are minimally improved, however his symptoms when walking are worsening. He's still interested in [place] regardless of if he's a seed implant as a boost candidate. He will go for evaluation with Dr. Pilar Jarvis in June and I've contacted Dr. Pilar Jarvis about the patient's interest in remaining a candidate for this, as well as to see if he would be able to have an ultrasound that day to check if his gland has downsized.

## 2016-09-24 ENCOUNTER — Telehealth: Payer: Self-pay | Admitting: Radiation Oncology

## 2016-09-24 NOTE — Telephone Encounter (Signed)
Phoned patient back. Confirmed I received his voicemail message. Explained I have reached out to Dr. Carlynn Purl nurse, Rod Holler, and I am waiting to hear back from her. Patient verbalized understanding and expressed appreciation for the call.               Rod Holler called back. She explains Dr. Pilar Jarvis will be back in the office tomorrow. She expressed her intent to communicate the need for the ultrasound to Dr. Pilar Jarvis and reschedule the patients appointment to include an ultrasound. She committed to calling back with an update once appointments have been arranged.

## 2016-10-07 ENCOUNTER — Encounter: Payer: Self-pay | Admitting: Medical Oncology

## 2016-10-09 DIAGNOSIS — R972 Elevated prostate specific antigen [PSA]: Secondary | ICD-10-CM | POA: Diagnosis not present

## 2016-10-09 DIAGNOSIS — N5201 Erectile dysfunction due to arterial insufficiency: Secondary | ICD-10-CM | POA: Diagnosis not present

## 2016-10-10 ENCOUNTER — Telehealth: Payer: Self-pay | Admitting: Radiation Oncology

## 2016-10-10 ENCOUNTER — Encounter: Payer: Self-pay | Admitting: Radiation Oncology

## 2016-10-10 NOTE — Telephone Encounter (Signed)
Per [name]' order phoned patient and reviewed IPSS worksheet. Patient understands his results will be reported to the provider and our office will be in touch with next steps in the very near future. Patient expresses his anxiousness to get started. Patient is very hopeful he will qualify for a prostate seed implant.

## 2016-10-10 NOTE — Addendum Note (Signed)
Encounter addended by: [name], RN on: 10/10/2016  4:12 PM<BR>    Actions taken: Flowsheet accepted

## 2016-10-10 NOTE — Telephone Encounter (Signed)
Per [name]' request phoned [facility]. I spoke with Marlowe Kays there. She reports the patient had a pelvic/prostate ultrasound on 10/09/16. Marlowe Kays reports that per the provider's note the patient's prostate volume is now 54.9 cc. Marlowe Kays committed to faxing over the office note to [phone].

## 2016-10-10 NOTE — Telephone Encounter (Signed)
IPSS from January 23. IPSS today 17. See outlook inbasket for a side to side comparison. Sam

## 2016-10-10 NOTE — Telephone Encounter (Signed)
-----   Message from [name], [place] sent at 10/08/2016  8:09 AM EDT ----- Can you see if he's gone to have his ultrasound yet and the volume? Thanks, Bryson Ha  ----- Message ----- From: [name], [name] Sent: 08/28/2016   3:24 PM To: [name], [name] Subject: 09/27/16                                          ----- Message ----- From: [name], [name] Sent: 07/23/2016   9:33 AM To: [name], [name] Subject: 08/27/16                                       I spent 30 minutes talking to the patient regarding his interest in still being a candidate for radioactive seed implant as well as for XRT and SpaceOAR. His pre-Lupron IPSS score was 24, and Lupron was given on 06/25/16. His gland size was 74 cc prior to Lupron as well. I will call him on 08/27/16 to check on his symptoms, and we will review IPSS scoring by phone for his urinary symptoms. He will call sooner with questions or concerns.

## 2016-10-10 NOTE — Telephone Encounter (Signed)
Do you mind asking him IPSS questions by phone and comparing his previous? That will help [place] know if he's a candidate for seed boost

## 2016-10-10 NOTE — Telephone Encounter (Signed)
I spoke with Dr. [name] and the patient and since his IPSS score and gland size has improved as well. He is offered radioactive seed implant as boost followed by 5 weeks of external radiotherapy to the prostate. We outlined the delivery and logistics.

## 2016-10-29 ENCOUNTER — Other Ambulatory Visit: Payer: Self-pay | Admitting: Urology

## 2016-10-29 ENCOUNTER — Telehealth: Payer: Self-pay | Admitting: *Deleted

## 2016-10-29 DIAGNOSIS — C61 Malignant neoplasm of prostate: Secondary | ICD-10-CM

## 2016-10-29 NOTE — Telephone Encounter (Signed)
CALLED PATIENT TO INFORM OF FIDUCIAL MARKER PLACEMENT AND SPACE OAR ON 12-05-16 @ 10 AM @ [place], SPOKE WITH PATIENT AND HE IS AWARE OF THIS PROCEDURE.

## 2016-10-31 ENCOUNTER — Other Ambulatory Visit: Payer: Self-pay | Admitting: Urology

## 2016-11-01 NOTE — Addendum Note (Signed)
Encounter addended by: [name], MD on: 11/01/2016  7:34 AM<BR>    Actions taken: Visit Navigator Flowsheet section accepted

## 2016-11-06 ENCOUNTER — Encounter: Payer: Self-pay | Admitting: Radiation Oncology

## 2016-11-11 ENCOUNTER — Other Ambulatory Visit: Payer: Self-pay | Admitting: Urology

## 2016-11-11 ENCOUNTER — Telehealth: Payer: Self-pay | Admitting: *Deleted

## 2016-11-11 NOTE — Telephone Encounter (Signed)
Called patient to inform of fid. Markers, implant and space oar, spoke with patient and he is aware of these procedures.

## 2016-11-11 NOTE — Progress Notes (Signed)
  Radiation Oncology         [phone] ________________________________  Name: ELYE HARMSEN MRN: [id number]  Date: 11/15/2016  DOB: 1945-05-25  SIMULATION AND TREATMENT PLANNING NOTE PUBIC ARCH STUDY  SL:[id number], Perry Mount, NP  [name], MD  DIAGNOSIS: 72 year-old gentleman with T1c, adenocarcinoma of the prostate with a PSA of 5.06, and a Gleason Score of 4+5     ICD-10-CM   1. Malignant neoplasm of prostate ([place]) C61     COMPLEX SIMULATION:  The patient presented today for evaluation for possible prostate seed implant. He was brought to the radiation planning suite and placed supine on the CT couch. A 3-dimensional image study set was obtained in upload to the planning computer. There, on each axial slice, I contoured the prostate gland. Then, using three-dimensional radiation planning tools I reconstructed the prostate in view of the structures from the transperineal needle pathway to assess for possible pubic arch interference. In doing so, I did not appreciate any pubic arch interference. Also, the patient's prostate volume was estimated based on the drawn structure. The volume was 47 cc.  Given the pubic arch appearance and prostate volume, patient remains a good candidate to proceed with prostate seed implant. Today, he freely provided informed written consent to proceed.  ri  PLAN: The patient will undergo prostate seed implant boost of 110 Gy up-front with placement of SpaceOAR and fiducials to be followed by external radiotherapy to the prostate, SVs and pelvic nodes to 45 Gy in 25 fractions of 1.8 Gy   ________________________________  Sheral Apley [name], [name]

## 2016-11-14 ENCOUNTER — Telehealth: Payer: Self-pay | Admitting: *Deleted

## 2016-11-14 NOTE — Telephone Encounter (Signed)
CALLED PATIENT TO REMIND OF PRE-SEED APPTS. FOR 11-15-16, SPOKE WITH PATIENT AND HE IS AWARE OF THESE APPTS.

## 2016-11-15 ENCOUNTER — Ambulatory Visit
Admission: RE | Admit: 2016-11-15 | Discharge: 2016-11-15 | Disposition: A | Discharge status: Home / Self Care | Payer: Medicare Other | Source: Ambulatory Visit | Attending: Radiation Oncology | Admitting: Radiation Oncology

## 2016-11-15 ENCOUNTER — Encounter: Payer: Self-pay | Admitting: Medical Oncology

## 2016-11-15 ENCOUNTER — Ambulatory Visit (HOSPITAL_COMMUNITY)
Admission: RE | Admit: 2016-11-15 | Discharge: 2016-11-15 | Disposition: A | Discharge status: Home / Self Care | Payer: Medicare Other | Source: Ambulatory Visit | Attending: Urology | Admitting: Urology

## 2016-11-15 ENCOUNTER — Other Ambulatory Visit: Payer: Self-pay | Admitting: Urology

## 2016-11-15 DIAGNOSIS — Z510 Encounter for antineoplastic radiation therapy: Secondary | ICD-10-CM | POA: Diagnosis not present

## 2016-11-15 DIAGNOSIS — C61 Malignant neoplasm of prostate: Secondary | ICD-10-CM | POA: Insufficient documentation

## 2016-11-15 DIAGNOSIS — Z01818 Encounter for other preprocedural examination: Secondary | ICD-10-CM | POA: Diagnosis not present

## 2016-11-15 NOTE — Progress Notes (Signed)
Dylan Woods scheduled for brachytherapy 12/17/16. He states he has been doing well and was excited that his prostate size decreased with the ADT so he can do the seed implant. He voiced how much he has enjoyed the support group and how it has helped him with decision making. I encouraged him to continue to come and our next meeting is 12/09/16.

## 2016-11-27 DIAGNOSIS — R300 Dysuria: Secondary | ICD-10-CM | POA: Diagnosis not present

## 2016-11-27 DIAGNOSIS — C61 Malignant neoplasm of prostate: Secondary | ICD-10-CM | POA: Diagnosis not present

## 2016-12-03 DIAGNOSIS — Z79899 Other long term (current) drug therapy: Secondary | ICD-10-CM | POA: Diagnosis not present

## 2016-12-03 DIAGNOSIS — G44229 Chronic tension-type headache, not intractable: Secondary | ICD-10-CM | POA: Diagnosis not present

## 2016-12-03 DIAGNOSIS — R5383 Other fatigue: Secondary | ICD-10-CM | POA: Diagnosis not present

## 2016-12-03 DIAGNOSIS — Z13220 Encounter for screening for lipoid disorders: Secondary | ICD-10-CM | POA: Diagnosis not present

## 2016-12-03 DIAGNOSIS — F419 Anxiety disorder, unspecified: Secondary | ICD-10-CM | POA: Diagnosis not present

## 2016-12-03 DIAGNOSIS — Z87891 Personal history of nicotine dependence: Secondary | ICD-10-CM | POA: Diagnosis not present

## 2016-12-03 DIAGNOSIS — C61 Malignant neoplasm of prostate: Secondary | ICD-10-CM | POA: Diagnosis not present

## 2016-12-03 DIAGNOSIS — Z0000 Encounter for general adult medical examination without abnormal findings: Secondary | ICD-10-CM | POA: Diagnosis not present

## 2016-12-05 ENCOUNTER — Ambulatory Visit (HOSPITAL_COMMUNITY): Payer: Medicare Other

## 2016-12-09 ENCOUNTER — Other Ambulatory Visit (HOSPITAL_COMMUNITY): Payer: Medicare Other

## 2016-12-09 ENCOUNTER — Encounter (HOSPITAL_BASED_OUTPATIENT_CLINIC_OR_DEPARTMENT_OTHER): Payer: Self-pay | Admitting: *Deleted

## 2016-12-09 ENCOUNTER — Encounter (HOSPITAL_BASED_OUTPATIENT_CLINIC_OR_DEPARTMENT_OTHER)
Admission: RE | Disposition: A | Discharge status: Home / Self Care | Payer: Self-pay | Source: Ambulatory Visit | Attending: Urology

## 2016-12-09 SURGERY — INSERTION, GOLD SEEDS
Anesthesia: General

## 2016-12-10 ENCOUNTER — Telehealth: Payer: Self-pay | Admitting: *Deleted

## 2016-12-10 ENCOUNTER — Encounter (HOSPITAL_BASED_OUTPATIENT_CLINIC_OR_DEPARTMENT_OTHER): Payer: Self-pay | Admitting: *Deleted

## 2016-12-10 NOTE — Progress Notes (Signed)
NPO AFTER MN W/ EXCEPTION CLEAR LIQUIDS UNTIL 0800 (NO CREAM/ MILK PRODUCTS).  ARRIVE AT 1230.  GETTING LAB WORK DONE Wednesday 12-11-2016 (CBC, CMET, PT/INR, PTT).  CURRENT EKG AND CXR IN CHART AND EPIC.  WILL DO FLEET AM DOS .

## 2016-12-10 NOTE — Telephone Encounter (Signed)
CALLED PATIENT TO REMIND OF LABS FOR IMPLANT, SPOKE WITH PATIENT AND HE IS AWARE OF THIS.

## 2016-12-11 LAB — COMPREHENSIVE METABOLIC PANEL
ALK PHOS: 65 U/L (ref 38–126)
ALT: 20 U/L (ref 17–63)
ANION GAP: 7 (ref 5–15)
AST: 22 U/L (ref 15–41)
Albumin: 4 g/dL (ref 3.5–5.0)
BUN: 22 mg/dL — ABNORMAL HIGH (ref 6–20)
CALCIUM: 8.9 mg/dL (ref 8.9–10.3)
CO2: 25 mmol/L (ref 22–32)
Chloride: 108 mmol/L (ref 101–111)
Creatinine, Ser: 0.92 mg/dL (ref 0.61–1.24)
GFR calc non Af Amer: >60 mL/min (ref >60)
Glucose, Bld: 87 mg/dL (ref 65–99)
POTASSIUM: 3.7 mmol/L (ref 3.5–5.1)
Sodium: 140 mmol/L (ref 135–145)
Total Bilirubin: 0.5 mg/dL (ref 0.3–1.2)
Total Protein: 6.9 g/dL (ref 6.5–8.1)

## 2016-12-11 LAB — CBC
HCT: 41.6 % (ref 39.0–52.0)
HEMOGLOBIN: 14.2 g/dL (ref 13.0–17.0)
MCH: 31.8 pg (ref 26.0–34.0)
MCHC: 34.1 g/dL (ref 30.0–36.0)
MCV: 93.3 fL (ref 78.0–100.0)
PLATELETS: 377 10*3/uL (ref 150–400)
RBC: 4.46 MIL/uL (ref 4.22–5.81)
RDW: 12.9 % (ref 11.5–15.5)
WBC: 6.4 10*3/uL (ref 4.0–10.5)

## 2016-12-11 LAB — PROTIME-INR
INR: 1.28
Prothrombin Time: 16.1 seconds — ABNORMAL HIGH (ref 11.4–15.2)

## 2016-12-11 LAB — APTT: APTT: 35 s (ref 24–36)

## 2016-12-13 DIAGNOSIS — C61 Malignant neoplasm of prostate: Secondary | ICD-10-CM | POA: Diagnosis not present

## 2016-12-17 ENCOUNTER — Telehealth: Payer: Self-pay | Admitting: *Deleted

## 2016-12-17 NOTE — Telephone Encounter (Signed)
CALLED PATIENT TO REMIND OF IMPLANT FOR 12-18-16, SPOKE WITH PATIENT AND HE IS AWARE OF THIS PROCEDURE.

## 2016-12-18 ENCOUNTER — Ambulatory Visit (HOSPITAL_BASED_OUTPATIENT_CLINIC_OR_DEPARTMENT_OTHER): Payer: Medicare Other | Admitting: Anesthesiology

## 2016-12-18 ENCOUNTER — Ambulatory Visit (HOSPITAL_BASED_OUTPATIENT_CLINIC_OR_DEPARTMENT_OTHER)
Admission: RE | Admit: 2016-12-18 | Discharge: 2016-12-18 | Disposition: A | Discharge status: Home / Self Care | Payer: Medicare Other | Source: Ambulatory Visit | Attending: Urology | Admitting: Urology

## 2016-12-18 ENCOUNTER — Ambulatory Visit (HOSPITAL_COMMUNITY): Payer: Medicare Other

## 2016-12-18 ENCOUNTER — Other Ambulatory Visit: Payer: Self-pay

## 2016-12-18 ENCOUNTER — Encounter (HOSPITAL_BASED_OUTPATIENT_CLINIC_OR_DEPARTMENT_OTHER): Payer: Self-pay

## 2016-12-18 ENCOUNTER — Encounter (HOSPITAL_BASED_OUTPATIENT_CLINIC_OR_DEPARTMENT_OTHER)
Admission: RE | Disposition: A | Discharge status: Home / Self Care | Payer: Self-pay | Source: Ambulatory Visit | Attending: Urology

## 2016-12-18 DIAGNOSIS — J45909 Unspecified asthma, uncomplicated: Secondary | ICD-10-CM | POA: Insufficient documentation

## 2016-12-18 DIAGNOSIS — F419 Anxiety disorder, unspecified: Secondary | ICD-10-CM | POA: Insufficient documentation

## 2016-12-18 DIAGNOSIS — Z87891 Personal history of nicotine dependence: Secondary | ICD-10-CM | POA: Diagnosis not present

## 2016-12-18 DIAGNOSIS — Z79899 Other long term (current) drug therapy: Secondary | ICD-10-CM | POA: Insufficient documentation

## 2016-12-18 DIAGNOSIS — C61 Malignant neoplasm of prostate: Secondary | ICD-10-CM | POA: Insufficient documentation

## 2016-12-18 DIAGNOSIS — Z8042 Family history of malignant neoplasm of prostate: Secondary | ICD-10-CM | POA: Diagnosis not present

## 2016-12-18 DIAGNOSIS — Z01818 Encounter for other preprocedural examination: Secondary | ICD-10-CM

## 2016-12-18 DIAGNOSIS — R002 Palpitations: Secondary | ICD-10-CM | POA: Diagnosis not present

## 2016-12-18 DIAGNOSIS — N5201 Erectile dysfunction due to arterial insufficiency: Secondary | ICD-10-CM | POA: Diagnosis not present

## 2016-12-18 DIAGNOSIS — Z888 Allergy status to other drugs, medicaments and biological substances status: Secondary | ICD-10-CM | POA: Diagnosis not present

## 2016-12-18 DIAGNOSIS — M1990 Unspecified osteoarthritis, unspecified site: Secondary | ICD-10-CM | POA: Insufficient documentation

## 2016-12-18 DIAGNOSIS — R011 Cardiac murmur, unspecified: Secondary | ICD-10-CM | POA: Diagnosis not present

## 2016-12-18 DIAGNOSIS — G43909 Migraine, unspecified, not intractable, without status migrainosus: Secondary | ICD-10-CM | POA: Diagnosis not present

## 2016-12-18 DIAGNOSIS — Z880 Allergy status to penicillin: Secondary | ICD-10-CM | POA: Diagnosis not present

## 2016-12-18 DIAGNOSIS — K4090 Unilateral inguinal hernia, without obstruction or gangrene, not specified as recurrent: Secondary | ICD-10-CM | POA: Diagnosis not present

## 2016-12-18 HISTORY — DX: Personal history of other specified conditions: Z87.898

## 2016-12-18 HISTORY — PX: CYSTOSCOPY: SHX5120

## 2016-12-18 HISTORY — DX: Benign prostatic hyperplasia with lower urinary tract symptoms: N40.1

## 2016-12-18 HISTORY — DX: Migraine, unspecified, not intractable, without status migrainosus: G43.909

## 2016-12-18 HISTORY — PX: RADIOACTIVE SEED IMPLANT: SHX5150

## 2016-12-18 HISTORY — PX: GOLD SEED IMPLANT: SHX6343

## 2016-12-18 HISTORY — PX: SPACE OAR INSTILLATION: SHX6769

## 2016-12-18 LAB — POCT I-STAT, CHEM 8
BUN: 24 mg/dL — ABNORMAL HIGH (ref 6–20)
CHLORIDE: 109 mmol/L (ref 101–111)
CREATININE: 0.7 mg/dL (ref 0.61–1.24)
Calcium, Ion: 1.26 mmol/L (ref 1.15–1.40)
GLUCOSE: 91 mg/dL (ref 65–99)
HCT: 42 % (ref 39.0–52.0)
Hemoglobin: 14.3 g/dL (ref 13.0–17.0)
POTASSIUM: 3.5 mmol/L (ref 3.5–5.1)
Sodium: 145 mmol/L (ref 135–145)
TCO2: 22 mmol/L (ref 22–32)

## 2016-12-18 SURGERY — INSERTION, GOLD SEEDS
Anesthesia: General | Site: Rectum

## 2016-12-18 MED ORDER — SODIUM CHLORIDE 0.9 % IJ SOLN
INTRAMUSCULAR | Status: DC | PRN
  Administered 2016-12-18: 10 mL

## 2016-12-18 MED ORDER — HYDROCODONE-ACETAMINOPHEN 5-325 MG PO TABS
1.0000 | ORAL_TABLET | ORAL | Status: DC | PRN
  Administered 2016-12-18: 0.5 via ORAL
  Filled 2016-12-18: qty 1

## 2016-12-18 MED ORDER — CIPROFLOXACIN IN D5W 400 MG/200ML IV SOLN
400.0000 mg | INTRAVENOUS | Status: AC
  Administered 2016-12-18: 400 mg via INTRAVENOUS
  Filled 2016-12-18: qty 200

## 2016-12-18 MED ORDER — LIDOCAINE 2% (20 MG/ML) 5 ML SYRINGE
INTRAMUSCULAR | Status: AC
  Filled 2016-12-18: qty 5

## 2016-12-18 MED ORDER — ONDANSETRON HCL 4 MG/2ML IJ SOLN
INTRAMUSCULAR | Status: AC
  Filled 2016-12-18: qty 2

## 2016-12-18 MED ORDER — MEPERIDINE HCL 25 MG/ML IJ SOLN
6.2500 mg | INTRAMUSCULAR | Status: DC | PRN
  Filled 2016-12-18: qty 1

## 2016-12-18 MED ORDER — ONDANSETRON HCL 4 MG/2ML IJ SOLN
INTRAMUSCULAR | Status: DC | PRN
  Administered 2016-12-18: 4 mg via INTRAVENOUS

## 2016-12-18 MED ORDER — PROPOFOL 10 MG/ML IV BOLUS
INTRAVENOUS | Status: AC
  Filled 2016-12-18: qty 20

## 2016-12-18 MED ORDER — FLEET ENEMA 7-19 GM/118ML RE ENEM
1.0000 | ENEMA | Freq: Once | RECTAL | Status: AC
  Administered 2016-12-18: 1 via RECTAL
  Filled 2016-12-18: qty 1

## 2016-12-18 MED ORDER — FENTANYL CITRATE (PF) 100 MCG/2ML IJ SOLN
25.0000 ug | INTRAMUSCULAR | Status: DC | PRN
  Filled 2016-12-18: qty 1

## 2016-12-18 MED ORDER — IOHEXOL 300 MG/ML  SOLN
INTRAMUSCULAR | Status: DC | PRN
  Administered 2016-12-18: 7 mL

## 2016-12-18 MED ORDER — FENTANYL CITRATE (PF) 100 MCG/2ML IJ SOLN
INTRAMUSCULAR | Status: AC
  Filled 2016-12-18: qty 2

## 2016-12-18 MED ORDER — DEXAMETHASONE SODIUM PHOSPHATE 4 MG/ML IJ SOLN
INTRAMUSCULAR | Status: DC | PRN
  Administered 2016-12-18: 10 mg via INTRAVENOUS

## 2016-12-18 MED ORDER — GENTAMICIN SULFATE 40 MG/ML IJ SOLN
5.0000 mg/kg | Freq: Once | INTRAVENOUS | Status: AC
  Administered 2016-12-18: 410 mg via INTRAVENOUS
  Filled 2016-12-18 (×2): qty 10.25

## 2016-12-18 MED ORDER — MIDAZOLAM HCL 2 MG/2ML IJ SOLN
INTRAMUSCULAR | Status: AC
  Filled 2016-12-18: qty 2

## 2016-12-18 MED ORDER — SODIUM CHLORIDE 0.9 % IR SOLN
Status: DC | PRN
  Administered 2016-12-18: 1000 mL

## 2016-12-18 MED ORDER — HYDROCODONE-ACETAMINOPHEN 5-325 MG PO TABS
ORAL_TABLET | ORAL | Status: AC
  Filled 2016-12-18: qty 1

## 2016-12-18 MED ORDER — OXYCODONE HCL 5 MG PO TABS
5.0000 mg | ORAL_TABLET | Freq: Once | ORAL | Status: DC | PRN
  Filled 2016-12-18: qty 1

## 2016-12-18 MED ORDER — FENTANYL CITRATE (PF) 100 MCG/2ML IJ SOLN
INTRAMUSCULAR | Status: DC | PRN
  Administered 2016-12-18: 12.5 ug via INTRAVENOUS
  Administered 2016-12-18 (×2): 25 ug via INTRAVENOUS
  Administered 2016-12-18: 12.5 ug via INTRAVENOUS
  Administered 2016-12-18: 50 ug via INTRAVENOUS
  Administered 2016-12-18: 25 ug via INTRAVENOUS

## 2016-12-18 MED ORDER — MIDAZOLAM HCL 5 MG/5ML IJ SOLN
INTRAMUSCULAR | Status: DC | PRN
  Administered 2016-12-18 (×2): 1 mg via INTRAVENOUS

## 2016-12-18 MED ORDER — LIDOCAINE 2% (20 MG/ML) 5 ML SYRINGE
INTRAMUSCULAR | Status: DC | PRN
  Administered 2016-12-18: 60 mg via INTRAVENOUS

## 2016-12-18 MED ORDER — EPHEDRINE 5 MG/ML INJ
INTRAVENOUS | Status: AC
  Filled 2016-12-18: qty 10

## 2016-12-18 MED ORDER — CIPROFLOXACIN IN D5W 400 MG/200ML IV SOLN
INTRAVENOUS | Status: AC
  Filled 2016-12-18: qty 200

## 2016-12-18 MED ORDER — DEXAMETHASONE SODIUM PHOSPHATE 10 MG/ML IJ SOLN
INTRAMUSCULAR | Status: AC
  Filled 2016-12-18: qty 1

## 2016-12-18 MED ORDER — EPHEDRINE SULFATE-NACL 50-0.9 MG/10ML-% IV SOSY
PREFILLED_SYRINGE | INTRAVENOUS | Status: DC | PRN
  Administered 2016-12-18 (×2): 10 mg via INTRAVENOUS

## 2016-12-18 MED ORDER — HYDROCODONE-ACETAMINOPHEN 5-325 MG PO TABS
1.0000 | ORAL_TABLET | Freq: Four times a day (QID) | ORAL | Status: DC | PRN
  Filled 2016-12-18: qty 1

## 2016-12-18 MED ORDER — MIDAZOLAM HCL 5 MG/5ML IJ SOLN: INTRAMUSCULAR | Status: DC | PRN

## 2016-12-18 MED ORDER — LEVOFLOXACIN 500 MG PO TABS: 500.0000 mg | ORAL_TABLET | Freq: Every day | ORAL | 0 refills | Status: DC

## 2016-12-18 MED ORDER — OXYCODONE HCL 5 MG/5ML PO SOLN
5.0000 mg | Freq: Once | ORAL | Status: DC | PRN
  Filled 2016-12-18: qty 5

## 2016-12-18 MED ORDER — PROPOFOL 10 MG/ML IV BOLUS
INTRAVENOUS | Status: DC | PRN
  Administered 2016-12-18: 200 mg via INTRAVENOUS

## 2016-12-18 MED ORDER — LACTATED RINGERS IV SOLN
INTRAVENOUS | Status: DC
  Administered 2016-12-18 (×2): via INTRAVENOUS
  Filled 2016-12-18: qty 1000

## 2016-12-18 MED ORDER — HYDROCODONE-ACETAMINOPHEN 5-325 MG PO TABS: 1.0000 | ORAL_TABLET | ORAL | 0 refills | Status: DC | PRN

## 2016-12-18 SURGICAL SUPPLY — 46 items
BAG URINE DRAINAGE (UROLOGICAL SUPPLIES) ×5 IMPLANT
BLADE CLIPPER SURG (BLADE) ×5 IMPLANT
CATH FOLEY 2WAY SLVR  5CC 16FR (CATHETERS) ×2
CATH FOLEY 2WAY SLVR 5CC 16FR (CATHETERS) ×3 IMPLANT
CATH ROBINSON RED A/P 20FR (CATHETERS) ×5 IMPLANT
CLOTH BEACON ORANGE TIMEOUT ST (SAFETY) ×5 IMPLANT
COVER BACK TABLE 60X90IN (DRAPES) ×5 IMPLANT
COVER MAYO STAND STRL (DRAPES) ×5 IMPLANT
DRAPE LG THREE QUARTER DISP (DRAPES) ×5 IMPLANT
DRAPE UNDERBUTTOCKS STRL (DRAPE) ×5 IMPLANT
DRSG OPSITE POSTOP 3X4 (GAUZE/BANDAGES/DRESSINGS) ×5 IMPLANT
DRSG TEGADERM 4X4.75 (GAUZE/BANDAGES/DRESSINGS) ×5 IMPLANT
DRSG TEGADERM 8X12 (GAUZE/BANDAGES/DRESSINGS) ×5 IMPLANT
DRSG TELFA 3X8 NADH (GAUZE/BANDAGES/DRESSINGS) ×5 IMPLANT
GAUZE SPONGE 4X4 12PLY STRL LF (GAUZE/BANDAGES/DRESSINGS) ×5 IMPLANT
GLOVE BIO SURGEON STRL SZ7.5 (GLOVE) ×10 IMPLANT
GLOVE BIOGEL PI IND STRL 7.5 (GLOVE) ×9 IMPLANT
GLOVE BIOGEL PI INDICATOR 7.5 (GLOVE) ×6
GLOVE ECLIPSE 8.0 STRL XLNG CF (GLOVE) IMPLANT
GOWN STRL REUS W/ TWL XL LVL3 (GOWN DISPOSABLE) IMPLANT
GOWN STRL REUS W/TWL LRG LVL3 (GOWN DISPOSABLE) ×10 IMPLANT
GOWN STRL REUS W/TWL XL LVL3 (GOWN DISPOSABLE)
HOLDER FOLEY CATH W/STRAP (MISCELLANEOUS) ×5 IMPLANT
IMPL SPACEOAR SYSTEM 10ML (MISCELLANEOUS) ×3 IMPLANT
IMPLANT SPACEOAR SYSTEM 10ML (MISCELLANEOUS) ×5
IV NS 1000ML (IV SOLUTION) ×2
IV NS 1000ML BAXH (IV SOLUTION) ×3 IMPLANT
KIT RM TURNOVER CYSTO AR (KITS) ×5 IMPLANT
LEGGING LITHOTOMY PAIR STRL (DRAPES) ×5 IMPLANT
MANIFOLD NEPTUNE II (INSTRUMENTS) IMPLANT
MARKER GOLD PRELOAD 1.2X3 (Urological Implant) ×9 IMPLANT
NDL SAFETY ECLIPSE 18X1.5 (NEEDLE) IMPLANT
NEEDLE HYPO 18GX1.5 SHARP (NEEDLE)
NEEDLE SPNL 22GX7 QUINCKE BK (NEEDLE) IMPLANT
NUCLETRON SELECTSEED I-125 ×320 IMPLANT
PACK CYSTO (CUSTOM PROCEDURE TRAY) ×5 IMPLANT
SEED GOLD PRELOAD 1.2X3 (Urological Implant) ×15 IMPLANT
SURGILUBE 2OZ TUBE FLIPTOP (MISCELLANEOUS) ×5 IMPLANT
SYR 20CC LL (SYRINGE) IMPLANT
SYR CONTROL 10ML LL (SYRINGE) IMPLANT
SYRINGE 10CC LL (SYRINGE) ×5 IMPLANT
TOWEL OR 17X24 6PK STRL BLUE (TOWEL DISPOSABLE) ×10 IMPLANT
TUBE CONNECTING 12'X1/4 (SUCTIONS)
TUBE CONNECTING 12X1/4 (SUCTIONS) IMPLANT
UNDERPAD 30X30 INCONTINENT (UNDERPADS AND DIAPERS) ×10 IMPLANT
WATER STERILE IRR 500ML POUR (IV SOLUTION) ×5 IMPLANT

## 2016-12-18 NOTE — Progress Notes (Signed)
  Radiation Oncology         [phone] ________________________________  Name: Dylan Woods MRN: [id number]  Date: 12/18/2016  DOB: Aug 27, 1945       Prostate Seed Implant  WS:[id number], Perry Mount, NP  No ref. provider found  DIAGNOSIS:  71 year-old gentleman with T1c adenocarcinoma of the prostate with Gleason Score of 4+5 and PSA of 5.06     ICD-10-CM   1. Malignant neoplasm of prostate ([place]) [place] Discharge patient  2. Pre-op testing Z01.818 DG Chest 2 View    DG Chest 2 View    PROCEDURE: Insertion of radioactive I-125 seeds into the prostate gland.  RADIATION DOSE: 110 Gy, boost therapy.  TECHNIQUE: NOLYN SWAB was brought to the operating room with the urologist. He was placed in the dorsolithotomy position. He was catheterized and a rectal tube was inserted. The perineum was shaved, prepped and draped. The ultrasound probe was then introduced into the rectum to see the prostate gland.  TREATMENT DEVICE: A needle grid was attached to the ultrasound probe stand and anchor needles were placed.  3D PLANNING: The prostate was imaged in 3D using a sagittal sweep of the prostate probe. These images were transferred to the planning computer. There, the prostate, urethra and rectum were defined on each axial reconstructed image. Then, the software created an optimized 3D plan and a few seed positions were adjusted. The quality of the plan was reviewed using [facility] information for the target and the following two organs at risk:  Urethra and Rectum.  Then the accepted plan was uploaded to the seed Selectron afterloading unit.  PROSTATE VOLUME STUDY:  Using transrectal ultrasound the volume of the prostate was verified to be 44.7 cc.  SPECIAL TREATMENT PROCEDURE/SUPERVISION AND HANDLING: The Nucletron FIRST system was used to place the needles under sagittal guidance. A total of 19 needles were used to deposit 64 seeds in the prostate gland. The individual seed activity was 0.398  mCi.  SpaceOAR:  Yes  COMPLEX SIMULATION: At the end of the procedure, an anterior radiograph of the pelvis was obtained to document seed positioning and count. Cystoscopy was performed to check the urethra and bladder.  MICRODOSIMETRY: At the end of the procedure, the patient was emitting 0.28 mR/hr at 1 meter. Accordingly, he was considered safe for hospital discharge.  PLAN: The patient will return to the radiation oncology clinic for post implant CT dosimetry and external planning for 45 Gy EBRT in three weeks.   ________________________________  Sheral Apley [name], [name]

## 2016-12-18 NOTE — Anesthesia Procedure Notes (Signed)
Procedure Name: LMA Insertion Date/Time: 12/18/2016 2:36 PM Performed by: Denna Haggard D Pre-anesthesia Checklist: Patient identified, Emergency Drugs available, Suction available and Patient being monitored Patient Re-evaluated:Patient Re-evaluated prior to induction Oxygen Delivery Method: Circle system utilized Preoxygenation: Pre-oxygenation with 100% oxygen Induction Type: IV induction Ventilation: Mask ventilation without difficulty LMA: LMA inserted LMA Size: 5.0 Number of attempts: 1 Airway Equipment and Method: Bite block Placement Confirmation: positive ETCO2 Tube secured with: Tape Dental Injury: Teeth and Oropharynx as per pre-operative assessment

## 2016-12-18 NOTE — Anesthesia Preprocedure Evaluation (Signed)
Anesthesia Evaluation  Patient identified by MRN, date of birth, ID band Patient awake    Reviewed: Allergy & Precautions, NPO status , Patient's Chart, lab work & pertinent test results  Airway Mallampati: II  TM Distance: >3 FB Neck ROM: Full    Dental no notable dental hx.    Pulmonary former smoker,    Pulmonary exam normal breath sounds clear to auscultation       Cardiovascular negative cardio ROS Normal cardiovascular exam+ Valvular Problems/Murmurs  Rhythm:Regular Rate:Normal     Neuro/Psych  Headaches, negative psych ROS   GI/Hepatic negative GI ROS, Neg liver ROS,   Endo/Other  negative endocrine ROS  Renal/GU negative Renal ROS     Musculoskeletal  (+) Arthritis ,   Abdominal   Peds  Hematology negative hematology ROS (+)   Anesthesia Other Findings   Reproductive/Obstetrics negative OB ROS                             Anesthesia Physical Anesthesia Plan  ASA: II  Anesthesia Plan: General   Post-op Pain Management:    Induction: Intravenous  PONV Risk Score and Plan: 3 and Ondansetron, Dexamethasone, Midazolam and Treatment may vary due to age or medical condition  Airway Management Planned:   Additional Equipment:   Intra-op Plan:   Post-operative Plan: Extubation in OR  Informed Consent: I have reviewed the patients History and Physical, chart, labs and discussed the procedure including the risks, benefits and alternatives for the proposed anesthesia with the patient or authorized representative who has indicated his/her understanding and acceptance.   Dental advisory given  Plan Discussed with: CRNA  Anesthesia Plan Comments:         Anesthesia Quick Evaluation

## 2016-12-18 NOTE — Op Note (Signed)
Preoperative diagnosis: Adenocarcinoma of the prostate   Postoperative diagnosis: Same   Procedure: I-125 prostate seed implantation, cystoscopy, Gold fiducial marker placement, SpaceOar placement  Surgeon: Renold Don.D.   Radiation oncologist: [name], [name]   Anesthesia: General  Drains: None  Complications: none  Indications: Clinically localized high risk prostate cancer  Procedure: Patient was brought to operating suite and placement table in the supine position. At this time, a universal timeout protocol was performed, all team members were identified, Venodyne boots are placed, and he was administered IV Ancef in the preoperative period. He was placed in lithotomy position and prepped and draped in usual manner. Radiation oncology department placed a transrectal ultrasound probe anchoring stand/ grid and aligned with previous imaging from the volume study. Foley catheter was inserted without difficulty.  All needle passage was done with real-time transrectal ultrasound guidance in both the transverse and sagittal plains with the aid of the robotic arm in order to achieve the desired preplanned position. A total of 19  needles were placed.  64 active seeds were implanted. Gold fiducial markers were then placed transperitoneally with ultrasound guidance in the right lateral apex, right lateral base, left lateral mid The space or needle was then placed transperitoneally into the perirectal fat with care not to enter the rectum. Puffs a normal saline were used to correctly identify the plane that we're in. The needle was advanced to the mid prostate. The puffs a saline did spread bilaterally equally. We were anterior to the rectum. SpaceOar was then placed over 10 seconds with good spread both proximally and distally as well as bilaterally. The Foley catheter was removed and a flexible cystoscopy failed to show any seeds outside the prostate without evidence of trauma to the urethral,  prostatic fossa, or bladder.  The bladder was drained.  A fluoroscopic image was then obtained showing excellent distrubution of the brachytherapy seeds.  Each seed was counted and counts were correct.    The patient was then repositioned in the supine position, reversed from anesthesia, and taken to the PACU in stable condition.

## 2016-12-18 NOTE — H&P (Signed)
CC: Elevated PSA--New Patient/Consult  HPI: Dylan Woods is a 71 year-old male established patient who is here evaluation of his PSA.  His last PSA was performed 12/03/2015. The last PSA value was 5.06. The patient states he does not take 5 alpha reductase inhibitor medication. He has not undergone a prior prostate biopsy. Patient does have a family history of prostate cancer. Family members with diagnosed prostate cancer include his brother. He has not recently had unwanted weight loss. He does not have a history of prostatitis.   PSA was 5.06 and 4.90 in August 2017. It was 2.31 in August 2016.   Brother with prostate cancer treated with prostatectomy.   Prostate biopsy showed gleason 4+5=9 prostate cancer in 6 of 12 cores. All on the left side. Three were gleason 4+5=9 (20%, 60%, and 70%). One core was gleason 4+3=7 (40%). Two were gleason 3+4=7 (20%, 50%).   CT scan and bone scan are negative.   He is elected to pursue radiation therapy. His goal is to start androgen deprivation therapy at this time to shrink is 74 cc prostate gland. If this is possible at 2 months, he will proceed with brachytherapy with radiation boost. If his prostate gland is not reduced in size sufficiently, he will proceed with pure external beam radiation therapy. He will start his Lupron injections today.   He returns today after being on Lupron therapy for approximately 2 months. His prostate volume on repeat ultrasound today is 54.9 cc.     CC: I am having trouble with my erections.  HPI:   The patient has had worsening ability to achieve and maintain her action since starting his Lupron therapy. He has taken Viagra in the past which has worked. He is requesting a prescription. He has no cardiac issues. He does not take nitrates for chest pain.     AUA Symptom Score: 50% of the time he has the sensation of not emptying his bladder completely when finished urinating. More than 50% of the time he has to urinate  again fewer than two hours after he has finished urinating. 50% of the time he has to start and stop again several times when he urinates. 50% of the time he finds it difficult to postpone urination. Almost always he has a weak urinary stream. Less than 20% of the time he has to push or strain to begin urination. He has to get up to urinate 3 times from the time he goes to bed until the time he gets up in the morning.   Calculated AUA Symptom Score: 22    ALLERGIES: Ampicillin CAPS - Skin Rash Ketorolac TABS    MEDICATIONS: Flomax 0.4 mg capsule, ext release 24 hr 1 capsule PO Q HS  Advil 200 mg tablet  Centrum Adults  Potassium Gluconate  Topiramate 25 mg tablet 3 tablet PO Q HS  Tylenol-Codeine No.3 300 mg-30 mg tablet Oral PRN  Vitamin C 500 mg tablet Oral  Xanax 0.25 mg tablet tablet PRN     GU PSH: Prostate Needle Biopsy - 03/29/2016      PSH Notes: Inguinal Hernia Repair, Cataract Surgery   NON-GU PSH: Cataract Surgery.., 2010, 2012 - 01/22/2016 Inguinal Hernia Repair > 5 yrs, Right - 1992 Knee Arthroscopy/surgery, Left, 2003 - 2008 Surgical Pathology, Gross And Microscopic Examination For Prostate Needle - 03/29/2016    GU PMH: Elevated PSA - 06/25/2016, - 05/28/2016, - 04/29/2016, - 04/08/2016, - 02/06/2016 Prostate Cancer - 04/08/2016 BPH w/LUTS - 02/06/2016, Benign prostatic  hyperplasia with urinary obstruction, - 2015 Encounter for Prostate Cancer screening - 02/06/2016 Nocturia - 02/06/2016 Urinary Frequency - 02/06/2016 Unil Inguinal Hernia W/O obst or gang,non-recurrent, Inguinal hernia, left - 2015 Epididymitis, Epididymitis - 2015 Testicular pain, unspecified, Testicular pain - 2015    NON-GU PMH: Encounter for general adult medical examination without abnormal findings, Encounter for preventive health examination - 2015 Personal history of other diseases of the musculoskeletal system and connective tissue, History of arthritis - 2015 Personal history of other  diseases of the nervous system and sense organs, History of migraine headaches - 2015 Anxiety Asthma Osteoarthritis    FAMILY HISTORY: Death of family member - Runs In Family lymphoma - Mother malignant neoplasm of stomach - Runs In Family malignant neoplasm of urinary bladder - Runs In Family Prostate Cancer - Runs In Family   SOCIAL HISTORY: Marital Status: Widowed Current Smoking Status: Patient does not smoke anymore. Has not smoked since 01/20/1970.  Drinks 2 drinks per day. Types of alcohol consumed: Beer, Wine. Light Drinker.  Drinks 1 caffeinated drink per day. 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     Notes: Former smoker, Widowed, Occupation, Caffeine use, Number of children, Alcohol use   REVIEW OF SYSTEMS:    GU Review Male:   Patient reports frequent urination, hard to postpone urination, get up at night to urinate, leakage of urine, and erection problems. Patient denies burning/ pain with urination, stream starts and stops, trouble starting your stream, have to strain to urinate , and penile pain.  Gastrointestinal (Upper):   Patient denies nausea, vomiting, and indigestion/ heartburn.  Gastrointestinal (Lower):   Patient denies diarrhea and constipation.  Constitutional:   Patient reports fatigue. Patient denies fever, night sweats, and weight loss.  Skin:   Patient denies skin rash/ lesion and itching.  Eyes:   Patient denies blurred vision and double vision.  Ears/ Nose/ Throat:   Patient denies sore throat and sinus problems.  Hematologic/Lymphatic:   Patient denies swollen glands and easy bruising.  Cardiovascular:   Patient denies leg swelling and chest pains.  Respiratory:   Patient denies cough and shortness of breath.  Endocrine:   Patient denies excessive thirst.  Musculoskeletal:   Patient denies back pain and joint pain.  Neurological:   Patient denies headaches and dizziness.  Psychologic:   Patient denies depression and anxiety.   VITAL  SIGNS:      10/09/2016 03:50 PM  BP 142/77 mmHg  Pulse 57 /min   MULTI-SYSTEM PHYSICAL EXAMINATION:    Constitutional: Well-nourished. No physical deformities. Normally developed. Good grooming.  Neck: Neck symmetrical, not swollen. Normal tracheal position.  Respiratory: No labored breathing, no use of accessory muscles.   Cardiovascular: Normal temperature, normal extremity pulses, no swelling, no varicosities.  Skin: No paleness, no jaundice, no cyanosis. No lesion, no ulcer, no rash.  Gastrointestinal: No mass, no tenderness, no rigidity, non obese abdomen.  Eyes: Normal conjunctivae. Normal eyelids.  Ears, Nose, Mouth, and Throat: Left ear no scars, no lesions, no masses. Right ear no scars, no lesions, no masses. Nose no scars, no lesions, no masses. Normal hearing. Normal lips.  Musculoskeletal: Normal gait and station of head and neck.     PAST DATA REVIEWED:  Source Of History:  Patient   06/25/16 12/03/15 11/24/15 12/21/14  PSA  Total PSA 6.41 ng/dl 5.06 ng/dl 4.90 ng/dl 2.31 ng/dl    PROCEDURES:         Prostate Ultrasound - 46962  Length: 5.35cm Height:  4.21cm Width: 4.65cm Volume: 54.79ml  Prostate:  Calcifications noted      The transrectal ultrasound probe is introduced into the rectum, and the prostate is visualized. Ultrasonography is utilized throughout the procedure. At the conclusion of the procedure, the ultrasound probe is removed. The patient tolerates the procedure without complication.         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 [name], MD   Created for Patient: Clinical Summary   Created for Francee Nodal, MD         Notes:   1. Prostate cancer -Plan for fiducials, brachytherapy, and SpaceOar today

## 2016-12-18 NOTE — Transfer of Care (Signed)
  Last Vitals:  Vitals:   12/18/16 1239 12/18/16 1615  BP: 136/82   Pulse: (!) 59   Resp: 16   Temp: 36.8 C (P) 36.5 C  SpO2: 99%     Last Pain:  Vitals:   12/18/16 1239  TempSrc: Oral      Patients Stated Pain Goal: 8 (12/18/16 1343)  Immediate Anesthesia Transfer of Care Note  Patient: Dylan Woods  Procedure(s) Performed: Procedure(s) (LRB): GOLD SEED IMPLANT (N/A) SPACE OAR INSTILLATION (N/A) RADIOACTIVE SEED IMPLANT/BRACHYTHERAPY IMPLANT (N/A) CYSTOSCOPY FLEXIBLE (N/A)  Patient Location: PACU  Anesthesia Type: General  Level of Consciousness: awake, alert  and oriented  Airway & Oxygen Therapy: Patient Spontanous Breathing and Patient connected to nasal cannula oxygen  Post-op Assessment: Report given to PACU RN and Post -op Vital signs reviewed and stable  Post vital signs: Reviewed and stable  Complications: No apparent anesthesia complications

## 2016-12-18 NOTE — Discharge Instructions (Signed)
Radioactive Seed Implant Home Care Instructions   Activity:    Rest for the remainder of the day.  Do not drive or operate equipment today.  You may resume normal  activities in a few days as instructed by your physician, without risk of harmful radiation exposure to those around you, provided you follow the time and distance precautions on the Radiation Oncology Instruction Sheet.   Meals: Drink plenty of lipuids and eat light foods, such as gelatin or soup this evening .  You may return to normal meal plan tomorrow.  Return To Work: You may return to work as instructed by your physician.  Special Instruction:   If any seeds are found, use tweezers to pick up seeds and place in a glass container of any kind and bring to your physician's office.  Call your physician if any of these symptoms occur:   Persistent or heavy bleeding  Urine stream diminishes or stops completely after catheter is removed  Fever equal to or greater than 101 degrees F  Cloudy urine with a strong foul odor  Severe pain  You may feel some burning pain and/or hesitancy when you urinate after the catheter is removed.  These symptoms may increase over the next few weeks, but should diminish within forur to six weeks.  Applying moist heat to the lower abdomen or a hot tub bath may help relieve the pain.  If the discomfort becomes severe, please call your physician for additional medications.     Post Anesthesia Home Care Instructions  Activity: Get plenty of rest for the remainder of the day. A responsible individual must stay with you for 24 hours following the procedure.  For the next 24 hours, DO NOT: -Drive a car -Operate machinery -Drink alcoholic beverages -Take any medication unless instructed by your physician -Make any legal decisions or sign important papers.  Meals: Start with liquid foods such as gelatin or soup. Progress to regular foods as tolerated. Avoid greasy, spicy, heavy foods. If  nausea and/or vomiting occur, drink only clear liquids until the nausea and/or vomiting subsides. Call your physician if vomiting continues.  Special Instructions/Symptoms: Your throat may feel dry or sore from the anesthesia or the breathing tube placed in your throat during surgery. If this causes discomfort, gargle with warm salt water. The discomfort should disappear within 24 hours.  If you had a scopolamine patch placed behind your ear for the management of post- operative nausea and/or vomiting:  1. The medication in the patch is effective for 72 hours, after which it should be removed.  Wrap patch in a tissue and discard in the trash. Wash hands thoroughly with soap and water. 2. You may remove the patch earlier than 72 hours if you experience unpleasant side effects which may include dry mouth, dizziness or visual disturbances. 3. Avoid touching the patch. Wash your hands with soap and water after contact with the patch.     

## 2016-12-19 ENCOUNTER — Encounter (HOSPITAL_BASED_OUTPATIENT_CLINIC_OR_DEPARTMENT_OTHER): Payer: Self-pay | Admitting: Urology

## 2016-12-19 NOTE — Anesthesia Postprocedure Evaluation (Signed)
Anesthesia Post Note  Patient: [name]  Procedure(s) Performed: Procedure(s) (LRB): GOLD SEED IMPLANT (N/A) SPACE OAR INSTILLATION (N/A) RADIOACTIVE SEED IMPLANT/BRACHYTHERAPY IMPLANT (N/A) CYSTOSCOPY FLEXIBLE (N/A)     Patient location during evaluation: PACU Anesthesia Type: General Level of consciousness: awake and alert Pain management: pain level controlled Vital Signs Assessment: post-procedure vital signs reviewed and stable Respiratory status: spontaneous breathing, nonlabored ventilation, respiratory function stable and patient connected to nasal cannula oxygen Cardiovascular status: blood pressure returned to baseline and stable Postop Assessment: no signs of nausea or vomiting Anesthetic complications: no    Last Vitals:  Vitals:   12/18/16 1645 12/18/16 1820  BP: (!) 142/76 (!) 150/67  Pulse: 62 (!) 59  Resp: 13 14  Temp:    SpO2: 100% 99%    Last Pain:  Vitals:   12/18/16 1820  TempSrc:   PainSc: 4                  Ryan P Ellender

## 2016-12-20 DIAGNOSIS — R3914 Feeling of incomplete bladder emptying: Secondary | ICD-10-CM | POA: Diagnosis not present

## 2016-12-20 DIAGNOSIS — R3912 Poor urinary stream: Secondary | ICD-10-CM | POA: Diagnosis not present

## 2016-12-23 ENCOUNTER — Emergency Department (HOSPITAL_COMMUNITY)
Admission: EM | Admit: 2016-12-23 | Discharge: 2016-12-23 | Disposition: A | Discharge status: Home / Self Care | Payer: Medicare Other | Attending: Emergency Medicine | Admitting: Emergency Medicine

## 2016-12-23 ENCOUNTER — Other Ambulatory Visit: Payer: Self-pay | Admitting: Radiation Oncology

## 2016-12-23 ENCOUNTER — Encounter (HOSPITAL_COMMUNITY): Payer: Self-pay | Admitting: *Deleted

## 2016-12-23 DIAGNOSIS — Z87898 Personal history of other specified conditions: Secondary | ICD-10-CM

## 2016-12-23 DIAGNOSIS — Z79899 Other long term (current) drug therapy: Secondary | ICD-10-CM | POA: Diagnosis not present

## 2016-12-23 DIAGNOSIS — R339 Retention of urine, unspecified: Secondary | ICD-10-CM | POA: Diagnosis not present

## 2016-12-23 DIAGNOSIS — Z87891 Personal history of nicotine dependence: Secondary | ICD-10-CM | POA: Diagnosis not present

## 2016-12-23 DIAGNOSIS — Z8546 Personal history of malignant neoplasm of prostate: Secondary | ICD-10-CM | POA: Insufficient documentation

## 2016-12-23 DIAGNOSIS — C61 Malignant neoplasm of prostate: Secondary | ICD-10-CM

## 2016-12-23 HISTORY — DX: Personal history of other specified conditions: Z87.898

## 2016-12-23 LAB — URINALYSIS, ROUTINE W REFLEX MICROSCOPIC
BACTERIA UA: NONE SEEN
BILIRUBIN URINE: NEGATIVE
Glucose, UA: NEGATIVE mg/dL
KETONES UR: NEGATIVE mg/dL
LEUKOCYTES UA: NEGATIVE
Nitrite: NEGATIVE
PROTEIN: NEGATIVE mg/dL
Specific Gravity, Urine: 1.012 (ref 1.005–1.030)
Squamous Epithelial / LPF: NONE SEEN
pH: 6 (ref 5.0–8.0)

## 2016-12-23 NOTE — ED Provider Notes (Signed)
[facility] DEPT Provider Note   CSN: [id number] Arrival date & time: 12/23/16  1545     History   Chief Complaint Chief Complaint  Patient presents with  . Urinary Retention    HPI Dylan Woods is a 71 y.o. male.  HPI  71 y.o. male with a hx of Prostate Cancer, presents to the Emergency Department today due to urinary retention. Pt diagnosed with Prostate Cancer. Surgery on August 30th with radiation seed implantation. Pt states unable to void since earlier this afternoon. Noted trouble Urinating on Friday and was sent home with I&O caths. Pt was sent by Urology on call as patient unable to I & O due to blood return and resistance. Notes mild abdominal pain. Rates 4/10. Pressure. No N/V. No back pain. Voided this AM with cath, but unable this afternoon. No CP/SOB. No fevers. No other symptoms noted.    Past Medical History:  Diagnosis Date  . Degenerative arthritis   . Heart murmur   . History of chest pain    per pt and epic documentation -- cardiac work-up done --- per dr Marlou Porch note felt to not be cardiac related  . Hyperplasia of prostate with lower urinary tract symptoms (LUTS)   . Migraines   . Prostate cancer [facility] urologist-  dr budzyn/  oncologist-  dr [name]   dx 03-29-2016-- Stage T1c, Gleason 4+5, PSA 5.06, vol 74.51cc--- Hormone therapy (lupron injection) prostate decreased to 47cc; now planned radiactive seed implants 12-18-2016    Patient Active Problem List   Diagnosis Date Noted  . PAC (premature atrial contraction) 06/17/2016  . Palpitations 05/22/2016  . Malignant neoplasm of prostate ([place]) 05/22/2016  . 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 Somnolence, daytime 12/27/2014  . Common migraine 06/21/2014  . Inguinal hernia, left 09/23/2013  . Headache 02/10/2013    Past Surgical History:  Procedure Laterality Date  . CARDIOVASCULAR STRESS TEST  06-19-2016   dr Marlou Porch   Intermediate risk nuclear study w/ no reversible ischemia with moderate  size and intensity fixed inferoseptal and septal perfusion defect, consider artifact scar/  LVEF nuclear stress 48% (45-54%)  with septal hypokinesis to dyskinesis  . CATARACT EXTRACTION W/ INTRAOCULAR LENS  IMPLANT, BILATERAL  2011;  2004  . CIRCUMCISION  age 86  . CYSTOSCOPY N/A 12/18/2016   Procedure: CYSTOSCOPY FLEXIBLE;  Surgeon: [name], MD;  Location: [facility];  Service: Urology;  Laterality: N/A;  NO SEEDS FOUND IN BLADDER  . GOLD SEED IMPLANT N/A 12/18/2016   Procedure: GOLD SEED IMPLANT;  Surgeon: [name], MD;  Location: [facility];  Service: Urology;  Laterality: N/A;   3 MARKERS  IMPLANTED  . INGUINAL HERNIA REPAIR Right 1992  . KNEE ARTHROSCOPY Left 2003;  2008  . NASAL SINUS SURGERY  2015  . PROSTATE BIOPSY  03-29-2016   dr Pilar Jarvis (office)  . RADIOACTIVE SEED IMPLANT N/A 12/18/2016   Procedure: RADIOACTIVE SEED IMPLANT/BRACHYTHERAPY IMPLANT;  Surgeon: [name], MD;  Location: [facility];  Service: Urology;  Laterality: N/A;    62  SEEDS IMPLANTED  . SPACE OAR INSTILLATION N/A 12/18/2016   Procedure: SPACE OAR INSTILLATION;  Surgeon: [name], MD;  Location: [facility];  Service: Urology;  Laterality: N/A;       Home Medications    Prior to Admission medications   Medication Sig Start Date End Date Taking? Authorizing Provider  acetaminophen (TYLENOL) 325 MG tablet Take 650 mg  by mouth every 6 (six) hours as needed for mild pain, moderate pain, fever or headache.    Yes [provider]  acetaminophen-codeine (TYLENOL #3) 300-30 MG per tablet Take 1 tablet by mouth every 4 (four) hours as needed for pain.   Yes [provider]  acyclovir (ZOVIRAX) 200 MG capsule Take 200 mg by mouth daily as needed (for cold sores).    Yes [provider]  ALPRAZolam (XANAX) 0.25 MG tablet Take 0.25 mg by mouth as needed for sleep (Taking when going on air  flights).    Yes [provider]  Ascorbic Acid (VITAMIN C) 1000 MG tablet Take 1,000 mg by mouth daily.   Yes [provider]  CALCIUM-VITAMIN D PO Take 1 tablet by mouth 2 (two) times daily. 600-400mg  tablet    Yes [provider]  HYDROcodone-acetaminophen (NORCO/VICODIN) 5-325 MG tablet Take 1 tablet by mouth every 4 (four) hours as needed for moderate pain. 12/18/16 12/18/17 Yes [name], MD  Leuprolide Acetate (LUPRON IJ) Inject as directed every 4 (four) months.    Yes [provider]  Multiple Vitamin (MULTIVITAMIN) tablet Take 1 tablet by mouth daily.   Yes [provider]  potassium gluconate 595 (99 K) MG TABS tablet Take 595 mg by mouth 2 (two) times daily.   Yes [provider]  tamsulosin (FLOMAX) 0.4 MG CAPS capsule Take 0.4 mg by mouth 2 (two) times daily.    Yes [provider]  topiramate (TOPAMAX) 25 MG tablet Take 3 tablets (75 mg total) by mouth at bedtime. Patient taking differently: Take 75 mg by mouth at bedtime.  06/27/16  Yes Kathrynn Ducking, MD  valACYclovir (VALTREX) 1000 MG tablet Take 1,000 mg by mouth daily as needed (for cold sores).  11/13/15  Yes [provider]  levofloxacin (LEVAQUIN) 500 MG tablet Take 1 tablet (500 mg total) by mouth daily. Patient not taking: Reported on 12/23/2016 12/18/16   [name], MD    Family History Family History  Problem Relation Age of Onset  . Parkinsonism Father   . Cancer Mother        lymphoma  . Cancer Brother        prostate ca s/p prostatectomy    Social History Social History  Substance Use Topics  . Smoking status: Former Smoker    Years: 6.00    Types: Cigarettes    Quit date: 12/10/1969  . Smokeless tobacco: Never Used  . Alcohol use 5.4 oz/week    3 Cans of beer, 6 Glasses of wine per week     Allergies   Ampicillin and Ketorolac   Review of Systems Review of Systems ROS reviewed and all are negative for acute  change except as noted in the HPI.  Physical Exam Updated Vital Signs BP (!) 150/98 (BP Location: Left Arm)   Pulse 65   Temp 98.2 F (36.8 C) (Oral)   Resp 17   Ht 6\' 1"  (1.854 m)   Wt 79.4 kg (175 lb)   SpO2 100%   BMI 23.09 kg/m   Physical Exam  Constitutional: He is oriented to person, place, and time. Vital signs are normal. He appears well-developed and well-nourished.  HENT:  Head: Normocephalic and atraumatic.  Right Ear: Hearing normal.  Left Ear: Hearing normal.  Eyes: Pupils are equal, round, and reactive to light. Conjunctivae and EOM are normal.  Neck: Normal range of motion. Neck supple.  Cardiovascular: Normal rate, regular rhythm, normal  heart sounds and intact distal pulses.   Pulmonary/Chest: Effort normal and breath sounds normal.  Abdominal: Soft. Normal appearance and bowel sounds are normal. There is tenderness in the suprapubic area. There is no rigidity, no rebound, no guarding, no CVA tenderness, no tenderness at McBurney's point and negative Murphy's sign.  Mild suprapubic tenderness. Soft.   Musculoskeletal: Normal range of motion.  Neurological: He is alert and oriented to person, place, and time.  Skin: Skin is warm and dry.  Psychiatric: He has a normal mood and affect. His speech is normal and behavior is normal. Thought content normal.  Nursing note and vitals reviewed.    ED Treatments / Results  Labs (all labs ordered are listed, but only abnormal results are displayed) Labs Reviewed  URINALYSIS, ROUTINE W REFLEX MICROSCOPIC - Abnormal; Notable for the following:       Result Value   Hgb urine dipstick SMALL (*)    All other components within normal limits    EKG  EKG Interpretation None       Radiology No results found.  Procedures Procedures (including critical care time)  Medications Ordered in ED Medications - No data to display   Initial Impression / Assessment and Plan / ED Course  I have reviewed the triage vital  signs and the nursing notes.  Pertinent labs & imaging results that were available during my care of the patient were reviewed by me and considered in my medical decision making (see chart for details).  Final Clinical Impressions(s) / ED Diagnoses  {I have reviewed and evaluated the relevant laboratory values.   {I have reviewed the relevant previous healthcare records.  {I obtained HPI from historian. {Patient discussed with supervising physician.  ED Course:  Assessment: Pt is a 71 y.o. male with a hx of Prostate Cancer, presents to the Emergency Department today due to urinary retention. Pt diagnosed with Prostate Cancer. Surgery on August 30th with radiation seed implantation. Pt states unable to void since earlier this afternoon. Noted trouble Urinating on Friday and was sent home with I&O caths. Pt was sent by Urology on call as patient unable to I & O due to blood return and resistance. Notes mild abdominal pain. Rates 4/10. Pressure. No N/V. No back pain. Voided this AM with cath, but unable this afternoon. No CP/SOB. No fevers. On exam, pt in NAD. Nontoxic/nonseptic appearing. VSS. Afebrile. Lungs CTA. Heart RRR. Abdomen mild TTP suprapubic. Inserted Coude cath with relief of symptoms. 500cc urine drained. UA without evidence of infection. Plan is to DC home with leg bag and follow up to Urology. At time of discharge, Patient is in no acute distress. Vital Signs are stable. Patient is able to ambulate. Patient able to tolerate PO.   Disposition/Plan:  DC Home Additional Verbal discharge instructions given and discussed with patient.  Pt Instructed to f/u with urology in the next week for evaluation and treatment of symptoms. Return precautions given Pt acknowledges and agrees with plan  Supervising Physician Malvin Johns, MD  Final diagnoses:  Urinary retention    New Prescriptions New Prescriptions   No medications on file     Shary Decamp, Hershal Coria 12/23/16 1745    Malvin Johns, MD 12/23/16 2218

## 2016-12-23 NOTE — ED Triage Notes (Signed)
Pt has prostate cancer, on Aug 30th surgery to plant radiation seed and mark for treatment. Unable to void, tried to I&O will not advance and blood returns. Urology sent pt for Coude cath

## 2016-12-23 NOTE — ED Notes (Signed)
Patient is alert and oriented x3.  He was given DC instructions and follow up visit instructions.  Patient gave verbal understanding.  He was DC ambulatory under his own power to home.  V/S stable.  He was not showing any signs of distress on DC 

## 2016-12-23 NOTE — Discharge Instructions (Signed)
Please read and follow all provided instructions.  Your diagnoses today include:  1. Urinary retention     Tests performed today include: Vital signs. See below for your results today.   Medications prescribed:  Take as prescribed   Home care instructions:  Follow any educational materials contained in this packet.  Follow-up instructions: Please follow-up with your Urologist for further evaluation of symptoms and treatment   Return instructions:  Please return to the Emergency Department if you do not get better, if you get worse, or new symptoms OR  - Fever (temperature greater than 101.39F)  - Bleeding that does not stop with holding pressure to the area    -Severe pain (please note that you may be more sore the day after your accident)  - Chest Pain  - Difficulty breathing  - Severe nausea or vomiting  - Inability to tolerate food and liquids  - Passing out  - Skin becoming red around your wounds  - Change in mental status (confusion or lethargy)  - New numbness or weakness    Please return if you have any other emergent concerns.  Additional Information:  Your vital signs today were: BP (!) 150/98 (BP Location: Left Arm)    Pulse 65    Temp 98.2 F (36.8 C) (Oral)    Resp 17    Ht 6\' 1"  (1.854 m)    Wt 79.4 kg (175 lb)    SpO2 100%    BMI 23.09 kg/m  If your blood pressure (BP) was elevated above 135/85 this visit, please have this repeated by your doctor within one month. ---------------

## 2016-12-26 ENCOUNTER — Telehealth: Payer: Self-pay | Admitting: Radiation Oncology

## 2016-12-26 DIAGNOSIS — R3912 Poor urinary stream: Secondary | ICD-10-CM | POA: Diagnosis not present

## 2016-12-26 DIAGNOSIS — R338 Other retention of urine: Secondary | ICD-10-CM | POA: Diagnosis not present

## 2016-12-26 NOTE — Telephone Encounter (Signed)
Returned patient's call from late yesterday. Patient phoned wishing to inform Dr. [name] of his current status since radiation seed implant one week ago. Patient explains he presented to the ER on Labor Day and a foley was replace. He goes onto explain the foley was removed yesterday but, "things aren't working right" so he is headed back to see Dr. Sharin Grave today. Reports constipation following the procedure due to oxycodone but, now he suffers from bowel frequency. Reassure patient this RN would inform Dr. [name] of these findings. Encouraged to continue follow up with Dr. Pilar Jarvis as he is best equipped to handle his LUTS. Patient verbalized understanding and expressed appreciation for the return call.

## 2017-01-03 DIAGNOSIS — R300 Dysuria: Secondary | ICD-10-CM | POA: Diagnosis not present

## 2017-01-03 DIAGNOSIS — R338 Other retention of urine: Secondary | ICD-10-CM | POA: Diagnosis not present

## 2017-01-03 DIAGNOSIS — R3912 Poor urinary stream: Secondary | ICD-10-CM | POA: Diagnosis not present

## 2017-01-08 ENCOUNTER — Ambulatory Visit
Admission: RE | Admit: 2017-01-08 | Discharge: 2017-01-08 | Disposition: A | Discharge status: Home / Self Care | Payer: Medicare Other | Source: Ambulatory Visit | Attending: Radiation Oncology | Admitting: Radiation Oncology

## 2017-01-08 ENCOUNTER — Other Ambulatory Visit: Payer: Self-pay | Admitting: Urology

## 2017-01-08 ENCOUNTER — Ambulatory Visit: Payer: Medicare Other | Admitting: Radiation Oncology

## 2017-01-08 ENCOUNTER — Ambulatory Visit (HOSPITAL_COMMUNITY)
Admission: RE | Admit: 2017-01-08 | Discharge: 2017-01-08 | Disposition: A | Discharge status: Home / Self Care | Payer: Medicare Other | Source: Ambulatory Visit | Attending: Urology | Admitting: Urology

## 2017-01-08 ENCOUNTER — Ambulatory Visit: Payer: Self-pay | Admitting: Radiation Oncology

## 2017-01-08 ENCOUNTER — Ambulatory Visit
Admission: RE | Admit: 2017-01-08 | Discharge: 2017-01-08 | Disposition: A | Discharge status: Home / Self Care | Payer: Medicare Other | Source: Ambulatory Visit

## 2017-01-08 ENCOUNTER — Encounter: Payer: Self-pay | Admitting: Urology

## 2017-01-08 ENCOUNTER — Inpatient Hospital Stay
Admission: RE | Admit: 2017-01-08 | Discharge: 2017-01-08 | Disposition: A | Discharge status: Home / Self Care | Payer: Self-pay | Source: Ambulatory Visit | Attending: Radiation Oncology | Admitting: Radiation Oncology

## 2017-01-08 DIAGNOSIS — C61 Malignant neoplasm of prostate: Secondary | ICD-10-CM

## 2017-01-08 DIAGNOSIS — Z510 Encounter for antineoplastic radiation therapy: Secondary | ICD-10-CM | POA: Diagnosis not present

## 2017-01-08 DIAGNOSIS — N3289 Other specified disorders of bladder: Secondary | ICD-10-CM

## 2017-01-08 MED ORDER — OXYBUTYNIN CHLORIDE 5 MG PO TABS: 5.0000 mg | ORAL_TABLET | Freq: Three times a day (TID) | ORAL | 1 refills | Status: DC | PRN

## 2017-01-08 MED ORDER — TRAMADOL HCL 50 MG PO TABS: 50.0000 mg | ORAL_TABLET | Freq: Four times a day (QID) | ORAL | 1 refills | Status: DC | PRN

## 2017-01-08 NOTE — Progress Notes (Signed)
  Radiation Oncology         (336) [phone] ________________________________  Name: Dylan Woods MRN: [id number]  Date: 01/08/2017  DOB: 03/20/1946  SIMULATION AND TREATMENT PLANNING NOTE    ICD-9-CM ICD-10-CM   1. Malignant neoplasm of prostate 185 C61     DIAGNOSIS:  71 year-old gentleman with T1c, adenocarcinoma of the prostate with a PSA of 5.06, and a Gleason Score of 4+5   NARRATIVE:  The patient was brought to the [facility].  Identity was confirmed.  All relevant records and images related to the planned course of therapy were reviewed.  The patient freely provided informed written consent to proceed with treatment after reviewing the details related to the planned course of therapy. The consent form was witnessed and verified by the simulation staff.  Then, the patient was set-up in a stable reproducible supine position for radiation therapy.  A vacuum lock pillow device was custom fabricated to position his legs in a reproducible immobilized position.  Then, I performed a urethrogram under sterile conditions to identify the prostatic apex.  CT images were obtained.  Surface markings were placed.  The CT images were loaded into the planning software.  Then the prostate target and avoidance structures including the rectum, bladder, bowel and hips were contoured.  Treatment planning then occurred.  The radiation prescription was entered and confirmed.  A total of 5 complex treatment devices were fabricated including one custom fit BodyFix pillow and 4 MLC's to shield bowel, bladder, rectum and hips. I have requested : 3D Simulation  I have requested a DVH of the following structures: rectum, bladder, prostate, left hip, and right hip.  PLAN:  The patient has received prostate seed implant up-front as boost and will now receive 45 Gy in 25 fractions  ________________________________  Sheral Apley. [name], [name]  This document serves as a record of services personally  performed by [name], MD. It was created on his behalf by Arlyce Harman, a trained medical scribe. The creation of this record is based on the scribe's personal observations and the provider's statements to them. This document has been checked and approved by the attending provider.

## 2017-01-08 NOTE — Progress Notes (Signed)
I met with Mr. Dylan Woods and his daughter today, prior to his CT SIM, regarding his post-op urinary retention following his brachytherapy 12/18/16. He had to have a catheter placed in the ER on 12/23/16 and has failed multiple voiding trials at the Urology office since that time. He is taking Flomax BID as prescribed.  He was most recently evaluated with Urology on 01/02/17 and had to have the foley catheter replaced. He is scheduled for a follow up in 2 weeks for repeat voiding trial.  In the meantime, he is having what sounds like severe bladder spasms with the urge to void despite having the catheter in place and catheter draining appropriately, pain throughout the penis as well as discomfort/pain at the tip of the penis. He is pushing fluids and reports that his urine is a light yellow in color.  He denies gross hematuria, flank pain, fever or chills.  He was recently started on antibiotics for a culture proven UTI and he reports that he is taking the medication as prescribed.  He has been using AZO and more recently Uribel which provides minimal, if any relief.  He is quite miserable with bladder discomfort and getting very little rest at night.  I have offered him a trial of Oxybutynin 102m po TID prn bladder spasms to see if this helps to relieve some of the bladder and penile discomfort.  I gave strict instructions to discontinue use of this medication 48 hours prior to his scheduled voiding trial so that this will not interfere with his success.  He will continue using Flomax BID as prescribed by Urology.  I also provided a Rx for Tramadol to use prn for pain that is unrelieved with Oxybutynin.  He was quite grateful for the time I spent with him today and for the offer of the above mentioned plan to get him some relief. He completed his CT simulation today and plans to begin EBRT prostate on 01/20/17. He will follow up with Urology as planned and knows to call with any questions or concerns in the interim.      ANicholos Johns [name]

## 2017-01-15 ENCOUNTER — Other Ambulatory Visit: Payer: Self-pay | Admitting: Neurology

## 2017-01-15 DIAGNOSIS — Z510 Encounter for antineoplastic radiation therapy: Secondary | ICD-10-CM | POA: Diagnosis not present

## 2017-01-15 DIAGNOSIS — C61 Malignant neoplasm of prostate: Secondary | ICD-10-CM | POA: Diagnosis not present

## 2017-01-18 DIAGNOSIS — Z23 Encounter for immunization: Secondary | ICD-10-CM | POA: Diagnosis not present

## 2017-01-20 ENCOUNTER — Ambulatory Visit
Admission: RE | Admit: 2017-01-20 | Discharge: 2017-01-20 | Disposition: A | Discharge status: Home / Self Care | Payer: Medicare Other | Source: Ambulatory Visit | Attending: Radiation Oncology | Admitting: Radiation Oncology

## 2017-01-20 ENCOUNTER — Telehealth: Payer: Self-pay | Admitting: Radiation Oncology

## 2017-01-20 DIAGNOSIS — Z510 Encounter for antineoplastic radiation therapy: Secondary | ICD-10-CM | POA: Diagnosis not present

## 2017-01-20 DIAGNOSIS — C61 Malignant neoplasm of prostate: Secondary | ICD-10-CM | POA: Diagnosis not present

## 2017-01-20 NOTE — Telephone Encounter (Signed)
I called the patient back after he called last week. One of his stickers has come off. He also has a foley indwelling and Dr. [name] would like him to clamp his foley 1 hr prior to treatment each day. He can get treatment with it to straight drain today but moving forward he should clamp it.

## 2017-01-21 ENCOUNTER — Ambulatory Visit
Admission: RE | Admit: 2017-01-21 | Discharge: 2017-01-21 | Disposition: A | Discharge status: Home / Self Care | Payer: Medicare Other | Source: Ambulatory Visit | Attending: Radiation Oncology | Admitting: Radiation Oncology

## 2017-01-21 DIAGNOSIS — C61 Malignant neoplasm of prostate: Secondary | ICD-10-CM | POA: Diagnosis not present

## 2017-01-21 DIAGNOSIS — Z510 Encounter for antineoplastic radiation therapy: Secondary | ICD-10-CM | POA: Diagnosis not present

## 2017-01-22 ENCOUNTER — Ambulatory Visit
Admission: RE | Admit: 2017-01-22 | Discharge: 2017-01-22 | Disposition: A | Discharge status: Home / Self Care | Payer: Medicare Other | Source: Ambulatory Visit | Attending: Radiation Oncology | Admitting: Radiation Oncology

## 2017-01-22 DIAGNOSIS — Z510 Encounter for antineoplastic radiation therapy: Secondary | ICD-10-CM | POA: Diagnosis not present

## 2017-01-22 DIAGNOSIS — C61 Malignant neoplasm of prostate: Secondary | ICD-10-CM | POA: Diagnosis not present

## 2017-01-23 ENCOUNTER — Ambulatory Visit
Admission: RE | Admit: 2017-01-23 | Discharge: 2017-01-23 | Disposition: A | Discharge status: Home / Self Care | Payer: Medicare Other | Source: Ambulatory Visit | Attending: Radiation Oncology | Admitting: Radiation Oncology

## 2017-01-23 DIAGNOSIS — Z510 Encounter for antineoplastic radiation therapy: Secondary | ICD-10-CM | POA: Diagnosis not present

## 2017-01-23 DIAGNOSIS — C61 Malignant neoplasm of prostate: Secondary | ICD-10-CM | POA: Diagnosis not present

## 2017-01-24 ENCOUNTER — Ambulatory Visit
Admission: RE | Admit: 2017-01-24 | Discharge: 2017-01-24 | Disposition: A | Discharge status: Home / Self Care | Payer: Medicare Other | Source: Ambulatory Visit | Attending: Radiation Oncology | Admitting: Radiation Oncology

## 2017-01-24 DIAGNOSIS — Z510 Encounter for antineoplastic radiation therapy: Secondary | ICD-10-CM | POA: Diagnosis not present

## 2017-01-24 DIAGNOSIS — C61 Malignant neoplasm of prostate: Secondary | ICD-10-CM | POA: Diagnosis not present

## 2017-01-27 ENCOUNTER — Encounter: Payer: Self-pay | Admitting: Medical Oncology

## 2017-01-27 ENCOUNTER — Ambulatory Visit
Admission: RE | Admit: 2017-01-27 | Discharge: 2017-01-27 | Disposition: A | Discharge status: Home / Self Care | Payer: Medicare Other | Source: Ambulatory Visit | Attending: Radiation Oncology | Admitting: Radiation Oncology

## 2017-01-27 DIAGNOSIS — Z510 Encounter for antineoplastic radiation therapy: Secondary | ICD-10-CM | POA: Diagnosis not present

## 2017-01-27 DIAGNOSIS — C61 Malignant neoplasm of prostate: Secondary | ICD-10-CM | POA: Diagnosis not present

## 2017-01-28 ENCOUNTER — Ambulatory Visit
Admission: RE | Admit: 2017-01-28 | Discharge: 2017-01-28 | Disposition: A | Discharge status: Home / Self Care | Payer: Medicare Other | Source: Ambulatory Visit | Attending: Radiation Oncology | Admitting: Radiation Oncology

## 2017-01-28 DIAGNOSIS — C61 Malignant neoplasm of prostate: Secondary | ICD-10-CM | POA: Diagnosis not present

## 2017-01-28 DIAGNOSIS — Z510 Encounter for antineoplastic radiation therapy: Secondary | ICD-10-CM | POA: Diagnosis not present

## 2017-01-28 NOTE — Progress Notes (Signed)
Mr. Wadhwa states he is doing well with radiation but has had a rough time post brachytherapy. He currently has a catheter because he could not void post seed implant.He is scheduled for a cysto and possible catheter removal in 2 weeks. He also describes bladder spasms and has Tramadol that Ashlyn prescribed. He states this has really helped. We discussed how his prostate was enlarged before seed implant and how the implant has caused swelling and irritation. He states that he knows the symptoms will improve in time and does not regret his treatment choice. I will continue to follow and asked him to call me with questions or concerns.

## 2017-01-29 ENCOUNTER — Ambulatory Visit
Admission: RE | Admit: 2017-01-29 | Discharge: 2017-01-29 | Disposition: A | Discharge status: Home / Self Care | Payer: Medicare Other | Source: Ambulatory Visit | Attending: Radiation Oncology | Admitting: Radiation Oncology

## 2017-01-29 DIAGNOSIS — C61 Malignant neoplasm of prostate: Secondary | ICD-10-CM | POA: Diagnosis not present

## 2017-01-29 DIAGNOSIS — Z510 Encounter for antineoplastic radiation therapy: Secondary | ICD-10-CM | POA: Diagnosis not present

## 2017-01-30 ENCOUNTER — Ambulatory Visit
Admission: RE | Admit: 2017-01-30 | Discharge: 2017-01-30 | Disposition: A | Discharge status: Home / Self Care | Payer: Medicare Other | Source: Ambulatory Visit | Attending: Radiation Oncology | Admitting: Radiation Oncology

## 2017-01-30 DIAGNOSIS — Z510 Encounter for antineoplastic radiation therapy: Secondary | ICD-10-CM | POA: Diagnosis not present

## 2017-01-30 DIAGNOSIS — C61 Malignant neoplasm of prostate: Secondary | ICD-10-CM | POA: Diagnosis not present

## 2017-01-31 ENCOUNTER — Ambulatory Visit
Admission: RE | Admit: 2017-01-31 | Discharge: 2017-01-31 | Disposition: A | Discharge status: Home / Self Care | Payer: Medicare Other | Source: Ambulatory Visit | Attending: Radiation Oncology | Admitting: Radiation Oncology

## 2017-01-31 DIAGNOSIS — C61 Malignant neoplasm of prostate: Secondary | ICD-10-CM | POA: Diagnosis not present

## 2017-01-31 DIAGNOSIS — Z510 Encounter for antineoplastic radiation therapy: Secondary | ICD-10-CM | POA: Diagnosis not present

## 2017-02-03 ENCOUNTER — Ambulatory Visit
Admission: RE | Admit: 2017-02-03 | Discharge: 2017-02-03 | Disposition: A | Discharge status: Home / Self Care | Payer: Medicare Other | Source: Ambulatory Visit | Attending: Radiation Oncology | Admitting: Radiation Oncology

## 2017-02-03 DIAGNOSIS — C61 Malignant neoplasm of prostate: Secondary | ICD-10-CM | POA: Diagnosis not present

## 2017-02-03 DIAGNOSIS — Z510 Encounter for antineoplastic radiation therapy: Secondary | ICD-10-CM | POA: Diagnosis not present

## 2017-02-04 ENCOUNTER — Ambulatory Visit
Admission: RE | Admit: 2017-02-04 | Discharge: 2017-02-04 | Disposition: A | Discharge status: Home / Self Care | Payer: Medicare Other | Source: Ambulatory Visit | Attending: Radiation Oncology | Admitting: Radiation Oncology

## 2017-02-04 DIAGNOSIS — Z510 Encounter for antineoplastic radiation therapy: Secondary | ICD-10-CM | POA: Diagnosis not present

## 2017-02-04 DIAGNOSIS — C61 Malignant neoplasm of prostate: Secondary | ICD-10-CM | POA: Diagnosis not present

## 2017-02-05 ENCOUNTER — Ambulatory Visit
Admission: RE | Admit: 2017-02-05 | Discharge: 2017-02-05 | Disposition: A | Discharge status: Home / Self Care | Payer: Medicare Other | Source: Ambulatory Visit | Attending: Radiation Oncology | Admitting: Radiation Oncology

## 2017-02-05 DIAGNOSIS — Z510 Encounter for antineoplastic radiation therapy: Secondary | ICD-10-CM | POA: Diagnosis not present

## 2017-02-05 DIAGNOSIS — C61 Malignant neoplasm of prostate: Secondary | ICD-10-CM | POA: Diagnosis not present

## 2017-02-06 ENCOUNTER — Ambulatory Visit: Admission: RE | Admit: 2017-02-06 | Payer: Medicare Other | Source: Ambulatory Visit

## 2017-02-07 ENCOUNTER — Ambulatory Visit
Admission: RE | Admit: 2017-02-07 | Discharge: 2017-02-07 | Disposition: A | Discharge status: Home / Self Care | Payer: Medicare Other | Source: Ambulatory Visit | Attending: Radiation Oncology | Admitting: Radiation Oncology

## 2017-02-07 ENCOUNTER — Other Ambulatory Visit: Payer: Self-pay | Admitting: Urology

## 2017-02-07 DIAGNOSIS — Z510 Encounter for antineoplastic radiation therapy: Secondary | ICD-10-CM | POA: Diagnosis not present

## 2017-02-07 DIAGNOSIS — C61 Malignant neoplasm of prostate: Secondary | ICD-10-CM | POA: Diagnosis not present

## 2017-02-07 MED ORDER — TRAMADOL HCL 50 MG PO TABS: 50.0000 mg | ORAL_TABLET | Freq: Four times a day (QID) | ORAL | 1 refills | Status: DC | PRN

## 2017-02-07 MED ORDER — OXYCODONE HCL 5 MG PO TABS: 5.0000 mg | ORAL_TABLET | ORAL | 0 refills | Status: DC | PRN

## 2017-02-10 ENCOUNTER — Ambulatory Visit
Admission: RE | Admit: 2017-02-10 | Discharge: 2017-02-10 | Disposition: A | Discharge status: Home / Self Care | Payer: Medicare Other | Source: Ambulatory Visit | Attending: Radiation Oncology | Admitting: Radiation Oncology

## 2017-02-10 DIAGNOSIS — Z510 Encounter for antineoplastic radiation therapy: Secondary | ICD-10-CM | POA: Diagnosis not present

## 2017-02-10 DIAGNOSIS — C61 Malignant neoplasm of prostate: Secondary | ICD-10-CM | POA: Diagnosis not present

## 2017-02-11 ENCOUNTER — Ambulatory Visit
Admission: RE | Admit: 2017-02-11 | Discharge: 2017-02-11 | Disposition: A | Discharge status: Home / Self Care | Payer: Medicare Other | Source: Ambulatory Visit | Attending: Radiation Oncology | Admitting: Radiation Oncology

## 2017-02-11 DIAGNOSIS — Z510 Encounter for antineoplastic radiation therapy: Secondary | ICD-10-CM | POA: Diagnosis not present

## 2017-02-11 DIAGNOSIS — C61 Malignant neoplasm of prostate: Secondary | ICD-10-CM | POA: Diagnosis not present

## 2017-02-12 ENCOUNTER — Ambulatory Visit
Admission: RE | Admit: 2017-02-12 | Discharge: 2017-02-12 | Disposition: A | Discharge status: Home / Self Care | Payer: Medicare Other | Source: Ambulatory Visit | Attending: Radiation Oncology | Admitting: Radiation Oncology

## 2017-02-12 DIAGNOSIS — Z510 Encounter for antineoplastic radiation therapy: Secondary | ICD-10-CM | POA: Diagnosis not present

## 2017-02-12 DIAGNOSIS — C61 Malignant neoplasm of prostate: Secondary | ICD-10-CM | POA: Diagnosis not present

## 2017-02-13 ENCOUNTER — Ambulatory Visit
Admission: RE | Admit: 2017-02-13 | Discharge: 2017-02-13 | Disposition: A | Discharge status: Home / Self Care | Payer: Medicare Other | Source: Ambulatory Visit | Attending: Radiation Oncology | Admitting: Radiation Oncology

## 2017-02-13 DIAGNOSIS — Z510 Encounter for antineoplastic radiation therapy: Secondary | ICD-10-CM | POA: Diagnosis not present

## 2017-02-13 DIAGNOSIS — N4889 Other specified disorders of penis: Secondary | ICD-10-CM | POA: Diagnosis not present

## 2017-02-13 DIAGNOSIS — C61 Malignant neoplasm of prostate: Secondary | ICD-10-CM | POA: Insufficient documentation

## 2017-02-13 DIAGNOSIS — Z880 Allergy status to penicillin: Secondary | ICD-10-CM | POA: Diagnosis not present

## 2017-02-13 DIAGNOSIS — Z79899 Other long term (current) drug therapy: Secondary | ICD-10-CM | POA: Insufficient documentation

## 2017-02-14 ENCOUNTER — Ambulatory Visit
Admission: RE | Admit: 2017-02-14 | Discharge: 2017-02-14 | Disposition: A | Discharge status: Home / Self Care | Payer: Medicare Other | Source: Ambulatory Visit | Attending: Radiation Oncology | Admitting: Radiation Oncology

## 2017-02-14 DIAGNOSIS — Z79899 Other long term (current) drug therapy: Secondary | ICD-10-CM | POA: Diagnosis not present

## 2017-02-14 DIAGNOSIS — Z880 Allergy status to penicillin: Secondary | ICD-10-CM | POA: Diagnosis not present

## 2017-02-14 DIAGNOSIS — N4889 Other specified disorders of penis: Secondary | ICD-10-CM | POA: Diagnosis not present

## 2017-02-14 DIAGNOSIS — C61 Malignant neoplasm of prostate: Secondary | ICD-10-CM | POA: Diagnosis not present

## 2017-02-14 DIAGNOSIS — Z510 Encounter for antineoplastic radiation therapy: Secondary | ICD-10-CM | POA: Diagnosis not present

## 2017-02-17 ENCOUNTER — Ambulatory Visit
Admission: RE | Admit: 2017-02-17 | Discharge: 2017-02-17 | Disposition: A | Discharge status: Home / Self Care | Payer: Medicare Other | Source: Ambulatory Visit | Attending: Radiation Oncology | Admitting: Radiation Oncology

## 2017-02-17 DIAGNOSIS — N4889 Other specified disorders of penis: Secondary | ICD-10-CM | POA: Diagnosis not present

## 2017-02-17 DIAGNOSIS — Z880 Allergy status to penicillin: Secondary | ICD-10-CM | POA: Diagnosis not present

## 2017-02-17 DIAGNOSIS — C61 Malignant neoplasm of prostate: Secondary | ICD-10-CM | POA: Diagnosis not present

## 2017-02-17 DIAGNOSIS — Z79899 Other long term (current) drug therapy: Secondary | ICD-10-CM | POA: Diagnosis not present

## 2017-02-17 DIAGNOSIS — Z510 Encounter for antineoplastic radiation therapy: Secondary | ICD-10-CM | POA: Diagnosis not present

## 2017-02-18 ENCOUNTER — Ambulatory Visit
Admission: RE | Admit: 2017-02-18 | Discharge: 2017-02-18 | Disposition: A | Discharge status: Home / Self Care | Payer: Medicare Other | Source: Ambulatory Visit | Attending: Radiation Oncology | Admitting: Radiation Oncology

## 2017-02-18 ENCOUNTER — Telehealth: Payer: Self-pay | Admitting: Radiation Oncology

## 2017-02-18 DIAGNOSIS — Z79899 Other long term (current) drug therapy: Secondary | ICD-10-CM | POA: Diagnosis not present

## 2017-02-18 DIAGNOSIS — Z510 Encounter for antineoplastic radiation therapy: Secondary | ICD-10-CM | POA: Diagnosis not present

## 2017-02-18 DIAGNOSIS — N4889 Other specified disorders of penis: Secondary | ICD-10-CM | POA: Diagnosis not present

## 2017-02-18 DIAGNOSIS — R338 Other retention of urine: Secondary | ICD-10-CM | POA: Diagnosis not present

## 2017-02-18 DIAGNOSIS — C61 Malignant neoplasm of prostate: Secondary | ICD-10-CM | POA: Diagnosis not present

## 2017-02-18 DIAGNOSIS — Z880 Allergy status to penicillin: Secondary | ICD-10-CM | POA: Diagnosis not present

## 2017-02-18 NOTE — Telephone Encounter (Signed)
Received voicemail message from patient requesting return call. Phoned patient back at number provided ([phone]). No answer. Left message with my contact information requesting a return call with needs.

## 2017-02-19 ENCOUNTER — Ambulatory Visit
Admission: RE | Admit: 2017-02-19 | Discharge: 2017-02-19 | Disposition: A | Discharge status: Home / Self Care | Payer: Medicare Other | Source: Ambulatory Visit | Attending: Radiation Oncology | Admitting: Radiation Oncology

## 2017-02-19 DIAGNOSIS — Z510 Encounter for antineoplastic radiation therapy: Secondary | ICD-10-CM | POA: Diagnosis not present

## 2017-02-19 DIAGNOSIS — N4889 Other specified disorders of penis: Secondary | ICD-10-CM | POA: Diagnosis not present

## 2017-02-19 DIAGNOSIS — Z79899 Other long term (current) drug therapy: Secondary | ICD-10-CM | POA: Diagnosis not present

## 2017-02-19 DIAGNOSIS — Z880 Allergy status to penicillin: Secondary | ICD-10-CM | POA: Diagnosis not present

## 2017-02-19 DIAGNOSIS — C61 Malignant neoplasm of prostate: Secondary | ICD-10-CM | POA: Diagnosis not present

## 2017-02-20 ENCOUNTER — Ambulatory Visit
Admission: RE | Admit: 2017-02-20 | Discharge: 2017-02-20 | Disposition: A | Discharge status: Home / Self Care | Payer: Medicare Other | Source: Ambulatory Visit | Attending: Radiation Oncology | Admitting: Radiation Oncology

## 2017-02-20 DIAGNOSIS — C61 Malignant neoplasm of prostate: Secondary | ICD-10-CM | POA: Diagnosis not present

## 2017-02-20 DIAGNOSIS — Z880 Allergy status to penicillin: Secondary | ICD-10-CM | POA: Diagnosis not present

## 2017-02-20 DIAGNOSIS — N4889 Other specified disorders of penis: Secondary | ICD-10-CM | POA: Diagnosis not present

## 2017-02-20 DIAGNOSIS — Z79899 Other long term (current) drug therapy: Secondary | ICD-10-CM | POA: Diagnosis not present

## 2017-02-20 DIAGNOSIS — Z510 Encounter for antineoplastic radiation therapy: Secondary | ICD-10-CM | POA: Diagnosis not present

## 2017-02-21 ENCOUNTER — Ambulatory Visit: Payer: Medicare Other

## 2017-02-21 ENCOUNTER — Telehealth: Payer: Self-pay | Admitting: Radiation Oncology

## 2017-02-21 ENCOUNTER — Ambulatory Visit
Admission: RE | Admit: 2017-02-21 | Discharge: 2017-02-21 | Disposition: A | Discharge status: Home / Self Care | Payer: Medicare Other | Source: Ambulatory Visit | Attending: Radiation Oncology | Admitting: Radiation Oncology

## 2017-02-21 DIAGNOSIS — N4889 Other specified disorders of penis: Secondary | ICD-10-CM | POA: Diagnosis not present

## 2017-02-21 DIAGNOSIS — Z79899 Other long term (current) drug therapy: Secondary | ICD-10-CM | POA: Diagnosis not present

## 2017-02-21 DIAGNOSIS — Z510 Encounter for antineoplastic radiation therapy: Secondary | ICD-10-CM | POA: Diagnosis not present

## 2017-02-21 DIAGNOSIS — Z880 Allergy status to penicillin: Secondary | ICD-10-CM | POA: Diagnosis not present

## 2017-02-21 DIAGNOSIS — C61 Malignant neoplasm of prostate: Secondary | ICD-10-CM | POA: Diagnosis not present

## 2017-02-21 NOTE — Telephone Encounter (Signed)
Patient requesting referral to home health services. Phoned patient earlier this morning to inquire of needs. Patient reports he needs assistance with housekeeping, laundry and preparing meals. Requested patient allow time for this RN to research home care services. Phoned patient back this afternoon and explained I can't recommend one agency or another but I understand others in our area have used [facility], [employer] or [employer]. Patient verbalized understanding and expressed appreciation for the return call.

## 2017-02-24 ENCOUNTER — Ambulatory Visit
Admission: RE | Admit: 2017-02-24 | Discharge: 2017-02-24 | Disposition: A | Discharge status: Home / Self Care | Payer: Medicare Other | Source: Ambulatory Visit | Attending: Radiation Oncology | Admitting: Radiation Oncology

## 2017-02-24 ENCOUNTER — Encounter: Payer: Self-pay | Admitting: Radiation Oncology

## 2017-02-24 DIAGNOSIS — Z79899 Other long term (current) drug therapy: Secondary | ICD-10-CM | POA: Diagnosis not present

## 2017-02-24 DIAGNOSIS — C61 Malignant neoplasm of prostate: Secondary | ICD-10-CM | POA: Diagnosis not present

## 2017-02-24 DIAGNOSIS — N4889 Other specified disorders of penis: Secondary | ICD-10-CM | POA: Diagnosis not present

## 2017-02-24 DIAGNOSIS — Z510 Encounter for antineoplastic radiation therapy: Secondary | ICD-10-CM | POA: Diagnosis not present

## 2017-02-24 DIAGNOSIS — Z880 Allergy status to penicillin: Secondary | ICD-10-CM | POA: Diagnosis not present

## 2017-02-26 NOTE — Progress Notes (Signed)
  Radiation Oncology         [phone]) [phone] ________________________________  Name: Dylan Woods MRN: [id number]  Date: 02/24/2017  DOB: 08/21/1945  End of Treatment Note  Diagnosis:   71year-old gentleman withT1c, adenocarcinoma of the prostate with a PSA of 5.06, and a Gleason Score of 4+5     Indication for treatment:  Curative, Definitive Radiotherapy       Radiation treatment dates:  Seed implant on 12/18/2016 followed by external beam radiation 01/20/2017 to 02/24/2017.  Site/dose:  1. The prostate, seminal vesicles, and pelvic lymph nodes were initially treated to 45 Gy in 25 fractions of 1.8 Gy  2.  Seed implant boost of 110 Gy with placement of SpaceOAR on 12/18/16 prior to beginning IMRT  Beams/energy:  1. The prostate, seminal vesicles, and pelvic lymph nodes were initially treated using VMAT intensity modulated radiotherapy delivering 6 megavolt photons. Image guidance was performed with CB-CT studies prior to each fraction. He was immobilized with a body fix lower extremity mold.   Narrative: The patient tolerated radiation treatment relatively well.   The patient experienced significant urinary irritation with retention following his seed implant and went on to fail multiple voiding trials at the Urology office requiring chronic indwelling foley catheter throughout his treatment. He experienced severe pain in the tip of the penis from catheter irritation despite using ointments and numbing gel provided by urology.  He also experienced intermittent moderate-severe bladder spasms and modest fatigue.  He reported minimal relief with use of tramadol and/or Oxy IR. He also reported rectal irritation unchanged and weight loss.   Plan: The patient has completed radiation treatment. Informed patient that 2 weeks after radiation treatments, radiation edema should improve. Patient stated that he is on a low fiber diet and this may explain the weight loss as well as not being hydrated. I  advised the patient to stay hydrated. He will return to radiation oncology clinic for routine followup in one month. I advised him to call or return sooner if he has any questions or concerns related to his recovery or treatment. ________________________________  Sheral Apley. [name], [name]  This document serves as a record of services personally performed by [name], MD. It was created on his behalf by Arlyce Harman, a trained medical scribe. The creation of this record is based on the scribe's personal observations and the provider's statements to them. This document has been checked and approved by the attending provider.

## 2017-02-27 DIAGNOSIS — C61 Malignant neoplasm of prostate: Secondary | ICD-10-CM | POA: Diagnosis not present

## 2017-02-27 DIAGNOSIS — Z5111 Encounter for antineoplastic chemotherapy: Secondary | ICD-10-CM | POA: Diagnosis not present

## 2017-03-04 ENCOUNTER — Encounter: Payer: Self-pay | Admitting: Radiation Oncology

## 2017-03-06 DIAGNOSIS — R339 Retention of urine, unspecified: Secondary | ICD-10-CM | POA: Diagnosis not present

## 2017-03-09 NOTE — Progress Notes (Signed)
  Radiation Oncology         [phone]) [phone] ________________________________  Name: Dylan Woods MRN: [id number]  Date: 03/04/2017  DOB: January 27, 1946  3D Planning Note   Prostate Brachytherapy Post-Implant Dosimetry  Diagnosis: 71year-old gentleman withT1c, adenocarcinoma of the prostate with a PSA of 5.06, and a Gleason Score of 4+5  Narrative: On a previous date, Dylan Woods returned following prostate seed implantation for post implant planning. He underwent CT scan complex simulation to delineate the three-dimensional structures of the pelvis and demonstrate the radiation distribution.  Since that time, the seed localization, and complex isodose planning with dose volume histograms have now been completed.  Results:   Prostate Coverage - The dose of radiation delivered to the 90% or more of the prostate gland (D90) was 102.67% of the prescription dose. This exceeds our goal of greater than 90%. Rectal Sparing - The volume of rectal tissue receiving the prescription dose or higher was 0.0 cc. This falls under our thresholds tolerance of 1.0 cc.  Impression: The prostate seed implant appears to show adequate target coverage and appropriate rectal sparing.  Plan:  The patient will continue to follow with urology for ongoing PSA determinations. I would anticipate a high likelihood for local tumor control with minimal risk for rectal morbidity.  ________________________________  Sheral Apley [name], [name]

## 2017-03-26 ENCOUNTER — Other Ambulatory Visit: Payer: Self-pay

## 2017-03-26 ENCOUNTER — Encounter: Payer: Self-pay | Admitting: Urology

## 2017-03-26 ENCOUNTER — Ambulatory Visit
Admission: RE | Admit: 2017-03-26 | Discharge: 2017-03-26 | Disposition: A | Discharge status: Home / Self Care | Payer: Medicare Other | Source: Ambulatory Visit | Attending: Urology | Admitting: Urology

## 2017-03-26 VITALS — BP 115/75 | HR 65 | Temp 98.1°F | Resp 20 | Ht 73.0 in | Wt 173.0 lb

## 2017-03-26 DIAGNOSIS — Z880 Allergy status to penicillin: Secondary | ICD-10-CM | POA: Diagnosis not present

## 2017-03-26 DIAGNOSIS — Z510 Encounter for antineoplastic radiation therapy: Secondary | ICD-10-CM | POA: Diagnosis not present

## 2017-03-26 DIAGNOSIS — N4889 Other specified disorders of penis: Secondary | ICD-10-CM | POA: Diagnosis not present

## 2017-03-26 DIAGNOSIS — C61 Malignant neoplasm of prostate: Secondary | ICD-10-CM

## 2017-03-26 DIAGNOSIS — Z79899 Other long term (current) drug therapy: Secondary | ICD-10-CM | POA: Diagnosis not present

## 2017-03-26 MED ORDER — TRAMADOL HCL 50 MG PO TABS: 50.0000 mg | ORAL_TABLET | Freq: Four times a day (QID) | ORAL | 1 refills | Status: DC | PRN

## 2017-03-26 NOTE — Progress Notes (Signed)
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  Narrative:  The patient returns today for routine follow-up.  He tolerated radiation treatment relatively well.   He experienced significant urinary irritation with retention following his seed implant and went on to fail multiple voiding trials at the Urology office requiring chronic indwelling foley catheter throughout his treatment. He experienced severe pain in the tip of the penis from catheter irritation despite using ointments and numbing gel provided by urology.  He also experienced intermittent moderate-severe bladder spasms and modest fatigue.  He reported minimal relief with use of tramadol and/or Oxy IR. He also reported rectal irritation unchanged and weight loss.                              On review of systems, the patient states that he is doing well in general. Unfortunately, he has continued to fail voiding trials since completion of radiotherapy. Recent office cystoscopy was normal which is encouraging. At the time of his last voiding trial, he was found to have a UTI which was treated with antibiotics. However, he required continued indwelling Foley catheter.  He continues to have significant pain at the tip of his penis as well as intermittent moderate to severe bladder spasms. The bladder spasms are improved since the addition of Myrbetriq 50 mg daily which was prescribed by his urologist. He also continues taking Flomax daily. He denies any gross hematuria, fever, chills or night sweats. He has continued to  lose weight due to decreased appetite which he attributes to dealing with constant pain with a Foley catheter. He denies abdominal pain, nausea, vomiting or diarrhea.  He continues to tolerate his ADT fairly well.  ALLERGIES:  is allergic to ampicillin and ketorolac.  Meds: Current Outpatient Medications  Medication Sig Dispense Refill  . acetaminophen-codeine (TYLENOL #3) 300-30 MG per tablet Take 1 tablet by mouth every 4 (four) hours as needed for pain.    Marland Kitchen CALCIUM-VITAMIN D PO Take 1 tablet by mouth 2 (two) times daily. 600-400mg  tablet     . ibuprofen (ADVIL,MOTRIN) 200 MG tablet Take 200 mg by mouth every 6 (six) hours as needed.    Marland Kitchen Leuprolide Acetate (LUPRON IJ) Inject as directed every 4 (four) months.     . mirabegron ER (MYRBETRIQ) 50 MG TB24 tablet Take 50 mg by mouth daily.    . potassium gluconate 595 (99 K) MG TABS tablet Take 595 mg by mouth 2 (two) times daily.    . tamsulosin (FLOMAX) 0.4 MG CAPS capsule Take 0.4 mg by mouth 2 (two) times daily.     Marland Kitchen topiramate (TOPAMAX) 25 MG tablet TAKE 3 TABLETS AT BEDTIME 90 tablet 4  . traMADol (ULTRAM) 50 MG tablet Take 1 tablet (50 mg total) by mouth every 6 (six) hours as needed for moderate pain. 30 tablet 1  . acyclovir (ZOVIRAX) 200 MG capsule Take 200 mg by mouth daily as needed (for cold sores).     . ALPRAZolam (XANAX) 0.25 MG  tablet Take 0.25 mg by mouth as needed for sleep (Taking when going on air flights).     Marland Kitchen HYDROcodone-acetaminophen (NORCO/VICODIN) 5-325 MG tablet Take 1 tablet by mouth every 4 (four) hours as needed for moderate pain. (Patient not taking: Reported on 03/26/2017) 20 tablet 0  . oxybutynin (DITROPAN) 5 MG tablet Take 1 tablet (5 mg total) by mouth every 8 (eight) hours as needed for bladder spasms. (Patient not taking: Reported on 03/26/2017) 30 tablet 1  . oxyCODONE (OXY IR/ROXICODONE) 5 MG immediate release tablet Take 1 tablet (5 mg total) by mouth every 4 (four) hours as needed for severe pain.  (Patient not taking: Reported on 03/26/2017) 30 tablet 0  . valACYclovir (VALTREX) 1000 MG tablet Take 1,000 mg by mouth daily as needed (for cold sores).      No current facility-administered medications for this encounter.     Physical Findings:  height is 6\' 1"  (1.854 m) and weight is 173 lb (78.5 kg). His oral temperature is 98.1 F (36.7 C). His blood pressure is 115/75 and his pulse is 65. His respiration is 20 and oxygen saturation is 98%.  Pain Assessment Pain Score: 3  Pain Loc: Penis/10 In general this is a well appearing caucasian male in no acute distress. He's alert and oriented x4 and appropriate throughout the examination. Cardiopulmonary assessment is negative for acute distress and he exhibits normal effort. Foley catheter is in place and draining yellow urine appropriately.  Lab Findings: Lab Results  Component Value Date   WBC 6.4 12/11/2016   HGB 14.3 12/18/2016   HCT 42.0 12/18/2016   MCV 93.3 12/11/2016   PLT 377 12/11/2016     Radiographic Findings: No results found.  Impression/Plan: 35. 71year-old gentleman withT1c, adenocarcinoma of the prostate with a PSA of 5.06, and a Gleason Score of 4+5.   He will continue to follow up with urology for ongoing PSA determinations and has an appointment scheduled with Dr. Pilar Jarvis on 04/07/17. He understands what to expect with regards to PSA monitoring going forward. I will look forward to following his response to treatment via correspondence with urology, and would be happy to continue to participate in his care if clinically indicated. I talked to the patient about what to expect in the future, including his risk for erectile dysfunction and rectal bleeding. I encouraged him to call or return to the office if he has any questions regarding his previous radiation or possible radiation side effects. He knows to call at any time with any questions or concerns related to his previous radiotherapy. He was comfortable with this  plan and will follow up as needed. 2. Penile pain.  This is secondary to indwelling Foley catheter. This pain is well controlled with the use of tramadol as needed. A prescription refill of Tramadol is provided today.  He will keep his planned follow-up with Dr. Pilar Jarvis.    Nicholos Johns, [name]

## 2017-03-28 DIAGNOSIS — H5201 Hypermetropia, right eye: Secondary | ICD-10-CM | POA: Diagnosis not present

## 2017-03-28 DIAGNOSIS — Z961 Presence of intraocular lens: Secondary | ICD-10-CM | POA: Diagnosis not present

## 2017-03-28 DIAGNOSIS — H401331 Pigmentary glaucoma, bilateral, mild stage: Secondary | ICD-10-CM | POA: Diagnosis not present

## 2017-04-09 DIAGNOSIS — R3912 Poor urinary stream: Secondary | ICD-10-CM | POA: Diagnosis not present

## 2017-04-09 DIAGNOSIS — C61 Malignant neoplasm of prostate: Secondary | ICD-10-CM | POA: Diagnosis not present

## 2017-04-09 DIAGNOSIS — R972 Elevated prostate specific antigen [PSA]: Secondary | ICD-10-CM | POA: Diagnosis not present

## 2017-05-06 DIAGNOSIS — L570 Actinic keratosis: Secondary | ICD-10-CM | POA: Diagnosis not present

## 2017-05-06 DIAGNOSIS — D1801 Hemangioma of skin and subcutaneous tissue: Secondary | ICD-10-CM | POA: Diagnosis not present

## 2017-05-06 DIAGNOSIS — L814 Other melanin hyperpigmentation: Secondary | ICD-10-CM | POA: Diagnosis not present

## 2017-05-06 DIAGNOSIS — L821 Other seborrheic keratosis: Secondary | ICD-10-CM | POA: Diagnosis not present

## 2017-05-06 DIAGNOSIS — D225 Melanocytic nevi of trunk: Secondary | ICD-10-CM | POA: Diagnosis not present

## 2017-05-08 DIAGNOSIS — R338 Other retention of urine: Secondary | ICD-10-CM | POA: Diagnosis not present

## 2017-05-13 ENCOUNTER — Telehealth: Payer: Self-pay | Admitting: Radiation Oncology

## 2017-05-13 NOTE — Telephone Encounter (Signed)
Unable to successfully send tramadol refill notification back to [facility]. I phoned the pharmacy. Spoke with pharmacist, Audrea Muscat, explaining the patient has been released by our practice and would need to acquire the medication from his PCP or urologist. She verbalized understanding.

## 2017-05-28 DIAGNOSIS — C61 Malignant neoplasm of prostate: Secondary | ICD-10-CM | POA: Diagnosis not present

## 2017-05-30 ENCOUNTER — Other Ambulatory Visit: Payer: Self-pay

## 2017-05-30 ENCOUNTER — Emergency Department (HOSPITAL_COMMUNITY)
Admission: EM | Admit: 2017-05-30 | Discharge: 2017-05-30 | Disposition: A | Discharge status: Home / Self Care | Payer: Medicare Other | Attending: Emergency Medicine | Admitting: Emergency Medicine

## 2017-05-30 ENCOUNTER — Encounter (HOSPITAL_COMMUNITY): Payer: Self-pay

## 2017-05-30 DIAGNOSIS — Z79899 Other long term (current) drug therapy: Secondary | ICD-10-CM | POA: Insufficient documentation

## 2017-05-30 DIAGNOSIS — Z87891 Personal history of nicotine dependence: Secondary | ICD-10-CM | POA: Diagnosis not present

## 2017-05-30 DIAGNOSIS — Z8546 Personal history of malignant neoplasm of prostate: Secondary | ICD-10-CM | POA: Diagnosis not present

## 2017-05-30 DIAGNOSIS — Y829 Unspecified medical devices associated with adverse incidents: Secondary | ICD-10-CM | POA: Diagnosis not present

## 2017-05-30 DIAGNOSIS — T83028A Displacement of other indwelling urethral catheter, initial encounter: Secondary | ICD-10-CM | POA: Diagnosis not present

## 2017-05-30 DIAGNOSIS — T83021A Displacement of indwelling urethral catheter, initial encounter: Secondary | ICD-10-CM

## 2017-05-30 NOTE — ED Notes (Signed)
Bed: WLPT3 Expected date:  Expected time:  Means of arrival:  Comments: 

## 2017-05-30 NOTE — ED Provider Notes (Signed)
[place] DEPT Provider Note   CSN: [id number] Arrival date & time: 05/30/17  1942     History   Chief Complaint Chief Complaint  Patient presents with  . Wants Foley Changed    HPI Dylan Woods is a 72 y.o. male history of prostate cancer status post radioactive seed implant and chronic Foley, here presenting with problems with a Foley.  His Foley was placed about 3 weeks ago at [facility] urology.  He states that he has been having progressive pain with his Foley over the last several days.  Today he noticed that the Foley is moving downward and the triage nurse noticed that the Foley balloon actually came out.  He states that he has severe bladder spasms associated with it but denies any fever or vomiting or urinary symptoms prior to this.  He is not on any antibiotics currently.  The history is provided by the patient.    Past Medical History:  Diagnosis Date  . Degenerative arthritis   . Heart murmur   . History of chest pain    per pt and epic documentation -- cardiac work-up done --- per dr Marlou Porch note felt to not be cardiac related  . Hyperplasia of prostate with lower urinary tract symptoms (LUTS)   . Migraines   . Prostate cancer [facility]) urologist-  dr budzyn/  oncologist-  dr [name]   dx 03-29-2016-- Stage T1c, Gleason 4+5, PSA 5.06, vol 74.51cc--- Hormone therapy (lupron injection) prostate decreased to 47cc; now planned radiactive seed implants 12-18-2016    Patient Active Problem List   Diagnosis Date Noted  . PAC (premature atrial contraction) 06/17/2016  . Palpitations 05/22/2016  . Malignant neoplasm of prostate ([place]) 05/22/2016  . 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 Somnolence, daytime 12/27/2014  . Common migraine 06/21/2014  . Inguinal hernia, left 09/23/2013  . Headache 02/10/2013    Past Surgical History:  Procedure Laterality Date  . CARDIOVASCULAR STRESS TEST  06-19-2016   dr Marlou Porch   Intermediate risk  nuclear study w/ no reversible ischemia with moderate size and intensity fixed inferoseptal and septal perfusion defect, consider artifact scar/  LVEF nuclear stress 48% (45-54%)  with septal hypokinesis to dyskinesis  . CATARACT EXTRACTION W/ INTRAOCULAR LENS  IMPLANT, BILATERAL  2011;  2004  . CIRCUMCISION  age 26  . CYSTOSCOPY N/A 12/18/2016   Procedure: CYSTOSCOPY FLEXIBLE;  Surgeon: [name], MD;  Location: [facility];  Service: Urology;  Laterality: N/A;  NO SEEDS FOUND IN BLADDER  . GOLD SEED IMPLANT N/A 12/18/2016   Procedure: GOLD SEED IMPLANT;  Surgeon: [name], MD;  Location: [facility];  Service: Urology;  Laterality: N/A;   3 MARKERS  IMPLANTED  . INGUINAL HERNIA REPAIR Right 1992  . KNEE ARTHROSCOPY Left 2003;  2008  . NASAL SINUS SURGERY  2015  . PROSTATE BIOPSY  03-29-2016   dr Pilar Jarvis (office)  . RADIOACTIVE SEED IMPLANT N/A 12/18/2016   Procedure: RADIOACTIVE SEED IMPLANT/BRACHYTHERAPY IMPLANT;  Surgeon: [name], MD;  Location: [facility];  Service: Urology;  Laterality: N/A;    62  SEEDS IMPLANTED  . SPACE OAR INSTILLATION N/A 12/18/2016   Procedure: SPACE OAR INSTILLATION;  Surgeon: [name], MD;  Location: [facility];  Service: Urology;  Laterality: N/A;       Home Medications    Prior to Admission medications   Medication Sig Start Date End Date Taking? Authorizing  Provider  acetaminophen-codeine (TYLENOL #3) 300-30 MG per tablet Take 1 tablet by mouth every 4 (four) hours as needed for pain.    [provider]  acyclovir (ZOVIRAX) 200 MG capsule Take 200 mg by mouth daily as needed (for cold sores).     [provider]  ALPRAZolam Duanne Moron) 0.25 MG tablet Take 0.25 mg by mouth as needed for sleep (Taking when going on air flights).     [provider]  CALCIUM-VITAMIN D PO Take 1 tablet by mouth 2 (two) times daily. 600-400mg  tablet      [provider]  HYDROcodone-acetaminophen (NORCO/VICODIN) 5-325 MG tablet Take 1 tablet by mouth every 4 (four) hours as needed for moderate pain. Patient not taking: Reported on 03/26/2017 12/18/16 12/18/17  [name], MD  ibuprofen (ADVIL,MOTRIN) 200 MG tablet Take 200 mg by mouth every 6 (six) hours as needed.    [provider]  Leuprolide Acetate (LUPRON IJ) Inject as directed every 4 (four) months.     [provider]  mirabegron ER (MYRBETRIQ) 50 MG TB24 tablet Take 50 mg by mouth daily.    [provider]  oxybutynin (DITROPAN) 5 MG tablet Take 1 tablet (5 mg total) by mouth every 8 (eight) hours as needed for bladder spasms. Patient not taking: Reported on 03/26/2017 01/08/17   Bruning, Ashlyn, [name]  oxyCODONE (OXY IR/ROXICODONE) 5 MG immediate release tablet Take 1 tablet (5 mg total) by mouth every 4 (four) hours as needed for severe pain. Patient not taking: Reported on 03/26/2017 02/07/17   Bruning, Ashlyn, [name]  potassium gluconate 595 (99 K) MG TABS tablet Take 595 mg by mouth 2 (two) times daily.    [provider]  tamsulosin (FLOMAX) 0.4 MG CAPS capsule Take 0.4 mg by mouth 2 (two) times daily.     [provider]  topiramate (TOPAMAX) 25 MG tablet TAKE 3 TABLETS AT BEDTIME 01/15/17   Kathrynn Ducking, MD  traMADol (ULTRAM) 50 MG tablet Take 1 tablet (50 mg total) by mouth every 6 (six) hours as needed for moderate pain. 03/26/17   Bruning, Ashlyn, [name]  valACYclovir (VALTREX) 1000 MG tablet Take 1,000 mg by mouth daily as needed (for cold sores).  11/13/15   [provider]    Family History Family History  Problem Relation Age of Onset  . Parkinsonism Father   . Cancer Mother        lymphoma  . Cancer Brother        prostate ca s/p prostatectomy    Social History Social History   Tobacco Use  . Smoking status: Former Smoker    Years: 6.00    Types: Cigarettes    Last attempt to quit: 12/10/1969     Years since quitting: 47.5  . Smokeless tobacco: Never Used  Substance Use Topics  . Alcohol use: Yes    Alcohol/week: 5.4 oz    Types: 3 Cans of beer, 6 Glasses of wine per week  . Drug use: No     Allergies   Ampicillin and Ketorolac   Review of Systems Review of Systems  Genitourinary: Positive for penile pain.  All other systems reviewed and are negative.    Physical Exam Updated Vital Signs BP (!) 147/94 (BP Location: Left Arm)   Pulse 72   Temp 98.5 F (36.9 C) (Oral)   Resp 18   SpO2 96%   Physical Exam  Constitutional: He is oriented to person, place, and time.  Chronically  ill   HENT:  Head: Normocephalic.  Mouth/Throat: Oropharynx is clear and moist.  Eyes: Conjunctivae and EOM are normal. Pupils are equal, round, and reactive to light.  Neck: Normal range of motion. Neck supple.  Cardiovascular: Normal rate, regular rhythm and normal heart sounds.  Pulmonary/Chest: Effort normal and breath sounds normal. No stridor. No respiratory distress. He has no wheezes.  Abdominal: Soft. Bowel sounds are normal.  Mild suprapubic tenderness   Genitourinary:  Genitourinary Comments: Foley in place   Musculoskeletal: Normal range of motion.  Neurological: He is alert and oriented to person, place, and time.  Skin: Skin is warm.  Psychiatric: He has a normal mood and affect.  Nursing note and vitals reviewed.    ED Treatments / Results  Labs (all labs ordered are listed, but only abnormal results are displayed) Labs Reviewed - No data to display  EKG  EKG Interpretation None       Radiology No results found.  Procedures BLADDER CATHETERIZATION Date/Time: 05/30/2017 9:36 PM Performed by: Drenda Freeze, MD Authorized by: Drenda Freeze, MD   Consent:    Consent obtained:  Verbal   Consent given by:  Patient   Risks discussed:  Urethral injury   Alternatives discussed:  No treatment Pre-procedure details:    Procedure purpose:   Therapeutic Anesthesia (see MAR for exact dosages):    Anesthesia method:  None Procedure details:    Catheter insertion:  Indwelling   Catheter type:  Foley   Catheter size:  18 Fr   Bladder irrigation: yes     Number of attempts:  1   Urine characteristics:  Blood-tinged Post-procedure details:    Patient tolerance of procedure:  Tolerated well, no immediate complications Comments:     Blood tinged initially, cleared with irrigation.    (including critical care time)    Medications Ordered in ED Medications - No data to display   Initial Impression / Assessment and Plan / ED Course  I have reviewed the triage vital signs and the nursing notes.  Pertinent labs & imaging results that were available during my care of the patient were reviewed by me and considered in my medical decision making (see chart for details).     Dylan Woods is a 72 y.o. male here with foley complication. Foley came out in triage. Triage nurse able to replace the foley under my supervision. There is some blood tinged urine initially. I was able to irrigate the foley with 200 cc normal saline and the blood cleared up. I think likely urethral trauma from the old foley balloon. I reassured patient. He has urology follow up next week. Stable for discharge.    Final Clinical Impressions(s) / ED Diagnoses   Final diagnoses:  Dislodged Foley catheter, initial encounter [facility])    ED Discharge Orders    None       Drenda Freeze, MD 05/30/17 [id number]

## 2017-05-30 NOTE — ED Triage Notes (Signed)
Pt reports that his current foley has been in for about 1 month. He states that it has come out farther than usual and he is concerned that the bulb is slipping or deflating.  He is requesting it to be changed.

## 2017-05-30 NOTE — Discharge Instructions (Signed)
Your foley was replaced in the ED today. Since your foley came out, there was some bleeding in the urethra. There might be some blood in your foley bag. Please drink plenty of water and it should clear up.   See your urologist as scheduled on Wednesday.   Return to ER if you have clots in your foley, severe bladder or penile pain, fever, vomiting, foley not draining.

## 2017-05-30 NOTE — ED Triage Notes (Deleted)
Pt reports recent hospital admission for a TIA. She is now complaining of hyperglycemia and back pain. Also reports hematuria. A&Ox4. Ambulatory.

## 2017-06-04 DIAGNOSIS — C61 Malignant neoplasm of prostate: Secondary | ICD-10-CM | POA: Diagnosis not present

## 2017-06-04 DIAGNOSIS — R3912 Poor urinary stream: Secondary | ICD-10-CM | POA: Diagnosis not present

## 2017-06-04 DIAGNOSIS — R972 Elevated prostate specific antigen [PSA]: Secondary | ICD-10-CM | POA: Diagnosis not present

## 2017-06-12 DIAGNOSIS — R3912 Poor urinary stream: Secondary | ICD-10-CM | POA: Diagnosis not present

## 2017-06-25 DIAGNOSIS — N401 Enlarged prostate with lower urinary tract symptoms: Secondary | ICD-10-CM | POA: Diagnosis not present

## 2017-06-25 DIAGNOSIS — R3914 Feeling of incomplete bladder emptying: Secondary | ICD-10-CM | POA: Diagnosis not present

## 2017-06-25 DIAGNOSIS — C61 Malignant neoplasm of prostate: Secondary | ICD-10-CM | POA: Diagnosis not present

## 2017-06-26 ENCOUNTER — Telehealth: Payer: Self-pay | Admitting: Neurology

## 2017-06-30 NOTE — Progress Notes (Signed)
[facility]  PATIENT: Dylan Woods DOB: 1946-03-26   REASON FOR VISIT: Follow-up for migraine headaches HISTORY FROM: Patient    HISTORY OF PRESENT ILLNESS:UPDATE 3/12/2019CM Dylan Woods, 72 year old male returns for follow-up with history of migraine headaches.  He has done well on Topamax and thinks is been longer than 6 months since his last migraine.  He has no desire to taper off the medication at this point.  He also has prostate cancer and is on Lupron.  He returns for reevaluation  06/27/16 Dylan Woods is a 72 year old right-handed white male with a history of migraine headache. The patient has done relatively well with the Topamax, he has tried to come off of the Topamax with return of the headache. The patient is still taking Advil daily as he feels like a headache might come on, but never turns into a severe headache. He has recently been diagnosed with prostate cancer, he just received his first injection of Lupron 3 days ago. He returns for further evaluation. He has not been having any severe headache issues. REVIEW OF SYSTEMS: Full 14 system review of systems performed and notable only for those listed, all others are neg:  Constitutional: Fatigue Cardiovascular: neg Ear/Nose/Throat: neg  Skin: neg Eyes: neg Respiratory: neg Gastroitestinal: neg  Genitourinary urinary catheter Hematology/Lymphatic: neg  Endocrine: neg Musculoskeletal:neg Allergy/Immunology: neg Neurological: neg Psychiatric: neg Sleep : neg   ALLERGIES: Allergies  Allergen Reactions  . Ampicillin Rash    Has patient had a PCN reaction causing immediate rash, facial/tongue/throat swelling, SOB or lightheadedness with hypotension: Yes Has patient had a PCN reaction causing severe rash involving mucus membranes or skin necrosis: No Has patient had a PCN reaction that required hospitalization: No Has patient had a PCN reaction occurring within the last 10 years: No If all of the  above answers are "NO", then may proceed with Cephalosporin use.   Marland Kitchen Ketorolac Other (See Comments)    "Makes him feel like he is crazy"    HOME MEDICATIONS: Outpatient Medications Prior to Visit  Medication Sig Dispense Refill  . acyclovir (ZOVIRAX) 200 MG capsule Take 200 mg by mouth daily as needed (for cold sores).     . ALPRAZolam (XANAX) 0.25 MG tablet Take 0.25 mg by mouth as needed for sleep (Taking when going on air flights).     . CALCIUM-VITAMIN D PO Take 1 tablet by mouth 2 (two) times daily. 600-400mg  tablet     . Homeopathic Products (AZO CONFIDENCE PO) Take 99.5 mg by mouth as needed.    Marland Kitchen ibuprofen (ADVIL,MOTRIN) 200 MG tablet Take 200 mg by mouth every 6 (six) hours as needed.    Marland Kitchen Leuprolide Acetate (LUPRON IJ) Inject as directed every 4 (four) months.     . Multiple Vitamin (MULTI-VITAMINS) TABS Take by mouth daily.    Marland Kitchen oxybutynin (DITROPAN) 5 MG tablet Take 1 tablet (5 mg total) by mouth every 8 (eight) hours as needed for bladder spasms. 30 tablet 1  . potassium gluconate 595 (99 K) MG TABS tablet Take 595 mg by mouth 2 (two) times daily.    Marland Kitchen topiramate (TOPAMAX) 25 MG tablet TAKE 3 TABLETS AT BEDTIME 90 tablet 0  . traMADol (ULTRAM) 50 MG tablet Take 1 tablet (50 mg total) by mouth every 6 (six) hours as needed for moderate pain. 30 tablet 1  . valACYclovir (VALTREX) 1000 MG tablet Take 1,000 mg by mouth daily as needed (for cold sores).     Marland Kitchen acetaminophen-codeine (  TYLENOL #3) 300-30 MG per tablet Take 1 tablet by mouth every 4 (four) hours as needed for pain.    Marland Kitchen HYDROcodone-acetaminophen (NORCO/VICODIN) 5-325 MG tablet Take 1 tablet by mouth every 4 (four) hours as needed for moderate pain. (Patient not taking: Reported on 03/26/2017) 20 tablet 0  . mirabegron ER (MYRBETRIQ) 50 MG TB24 tablet Take 50 mg by mouth daily.    Marland Kitchen oxyCODONE (OXY IR/ROXICODONE) 5 MG immediate release tablet Take 1 tablet (5 mg total) by mouth every 4 (four) hours as needed for severe pain.  (Patient not taking: Reported on 03/26/2017) 30 tablet 0  . tamsulosin (FLOMAX) 0.4 MG CAPS capsule Take 0.4 mg by mouth 2 (two) times daily.      No facility-administered medications prior to visit.     PAST MEDICAL HISTORY: Past Medical History:  Diagnosis Date  . Degenerative arthritis   . Heart murmur   . History of chest pain    per pt and epic documentation -- cardiac work-up done --- per dr Marlou Porch note felt to not be cardiac related  . Hyperplasia of prostate with lower urinary tract symptoms (LUTS)   . Migraines   . Prostate cancer [facility]) urologist-  dr budzyn/  oncologist-  dr [name]   dx 03-29-2016-- Stage T1c, Gleason 4+5, PSA 5.06, vol 74.51cc--- Hormone therapy (lupron injection) prostate decreased to 47cc; now planned radiactive seed implants 12-18-2016    PAST SURGICAL HISTORY: Past Surgical History:  Procedure Laterality Date  . CARDIOVASCULAR STRESS TEST  06-19-2016   dr Marlou Porch   Intermediate risk nuclear study w/ no reversible ischemia with moderate size and intensity fixed inferoseptal and septal perfusion defect, consider artifact scar/  LVEF nuclear stress 48% (45-54%)  with septal hypokinesis to dyskinesis  . CATARACT EXTRACTION W/ INTRAOCULAR LENS  IMPLANT, BILATERAL  2011;  2004  . CIRCUMCISION  age 42  . CYSTOSCOPY N/A 12/18/2016   Procedure: CYSTOSCOPY FLEXIBLE;  Surgeon: [name], MD;  Location: [facility];  Service: Urology;  Laterality: N/A;  NO SEEDS FOUND IN BLADDER  . GOLD SEED IMPLANT N/A 12/18/2016   Procedure: GOLD SEED IMPLANT;  Surgeon: [name], MD;  Location: [facility];  Service: Urology;  Laterality: N/A;   3 MARKERS  IMPLANTED  . INGUINAL HERNIA REPAIR Right 1992  . KNEE ARTHROSCOPY Left 2003;  2008  . NASAL SINUS SURGERY  2015  . PROSTATE BIOPSY  03-29-2016   dr Pilar Jarvis (office)  . RADIOACTIVE SEED IMPLANT N/A 12/18/2016   Procedure: RADIOACTIVE SEED IMPLANT/BRACHYTHERAPY IMPLANT;  Surgeon:  [name], MD;  Location: [facility];  Service: Urology;  Laterality: N/A;    62  SEEDS IMPLANTED  . SPACE OAR INSTILLATION N/A 12/18/2016   Procedure: SPACE OAR INSTILLATION;  Surgeon: [name], MD;  Location: [facility];  Service: Urology;  Laterality: N/A;    FAMILY HISTORY: Family History  Problem Relation Age of Onset  . Parkinsonism Father   . Cancer Mother        lymphoma  . Cancer Brother        prostate ca s/p prostatectomy    SOCIAL HISTORY: Social History   Socioeconomic History  . Marital status: Widowed    Spouse name: Not on file  . Number of children: 1  . Years of education: college  . Highest education level: Not on file  Social Needs  . Financial resource strain: Not on file  . Food insecurity -  worry: Not on file  . Food insecurity - inability: Not on file  . Transportation needs - medical: Not on file  . Transportation needs - non-medical: Not on file  Occupational History    Employer: [employer]: [employer]  Tobacco Use  . Smoking status: Former Smoker    Years: 6.00    Types: Cigarettes    Last attempt to quit: 12/10/1969    Years since quitting: 47.5  . Smokeless tobacco: Never Used  Substance and Sexual Activity  . Alcohol use: Yes    Alcohol/week: 5.4 oz    Types: 3 Cans of beer, 6 Glasses of wine per week  . Drug use: No  . Sexual activity: Not Currently  Other Topics Concern  . Not on file  Social History Narrative   Patient lives at home alone widowed.   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   Right handed.   College education.   Caffeine one cup daily coffee.     PHYSICAL EXAM  Vitals:   07/01/17 1027  BP: 120/70  Pulse: (!) 58  Weight: 171 lb 3.2 oz (77.7 kg)   Body mass index is 22.59 kg/m.  Generalized: Well developed, in no acute distress  Head: normocephalic and atraumatic,. Oropharynx benign  Neck: Supple,    Musculoskeletal: No deformity   Neurological examination   Mentation: Alert oriented to time, place, history taking. Attention span and concentration appropriate. Recent and remote memory intact.  Follows all commands speech and language fluent.   Cranial nerve II-XII: .Pupils were equal round reactive to light extraocular movements were full, visual field were full on confrontational test. Facial sensation and strength were normal. hearing was intact to finger rubbing bilaterally. Uvula tongue midline. head turning and shoulder shrug were normal and symmetric.Tongue protrusion into cheek strength was normal. Motor: normal bulk and tone, full strength in the BUE, BLE, fine finger movements normal, no pronator drift. No focal weakness Sensory: normal and symmetric to light touch, Coordination: finger-nose-finger, heel-to-shin bilaterally, no dysmetria Reflexes: Symmetric upper and lower, plantar responses were flexor bilaterally. Gait and Station: Rising up from seated position without assistance, normal stance,  moderate stride, good arm swing, smooth turning, able to perform tiptoe, and heel walking without difficulty. Tandem gait is steady  DIAGNOSTIC DATA (LABS, IMAGING, TESTING) - I reviewed patient records, labs, notes, testing and imaging myself where available.  Lab Results  Component Value Date   WBC 6.4 12/11/2016   HGB 14.3 12/18/2016   HCT 42.0 12/18/2016   MCV 93.3 12/11/2016   PLT 377 12/11/2016      Component Value Date/Time   NA 145 12/18/2016 1336   K 3.5 12/18/2016 1336   CL 109 12/18/2016 1336   CO2 25 12/11/2016 1400   GLUCOSE 91 12/18/2016 1336   BUN 24 (H) 12/18/2016 1336   CREATININE 0.70 12/18/2016 1336   CALCIUM 8.9 12/11/2016 1400   PROT 6.9 12/11/2016 1400   ALBUMIN 4.0 12/11/2016 1400   AST 22 12/11/2016 1400   ALT 20 12/11/2016 1400   ALKPHOS 65 12/11/2016 1400   BILITOT 0.5 12/11/2016 1400   GFRNONAA >60 12/11/2016 1400   GFRAA >60 12/11/2016  1400     ASSESSMENT AND PLAN  72 year old male with migraine headaches.  He also has prostate cancer treated with Lupron Patient has moved to the area and wishes to establish with primary care provider.  PLAN: Continue Topamax 75mg   at night will refill for one year F/U yearly and prn Call [phone] for physician referral Dennie Bible, [facility], [facility], [facility] [address] [place], [place] [address] [phone]

## 2017-07-01 ENCOUNTER — Ambulatory Visit (INDEPENDENT_AMBULATORY_CARE_PROVIDER_SITE_OTHER): Payer: Medicare Other | Admitting: Nurse Practitioner

## 2017-07-01 ENCOUNTER — Encounter: Payer: Self-pay | Admitting: Nurse Practitioner

## 2017-07-01 VITALS — BP 120/70 | HR 58 | Wt 171.2 lb

## 2017-07-01 DIAGNOSIS — R519 Headache, unspecified: Secondary | ICD-10-CM

## 2017-07-01 DIAGNOSIS — R51 Headache: Secondary | ICD-10-CM | POA: Diagnosis not present

## 2017-07-01 DIAGNOSIS — R338 Other retention of urine: Secondary | ICD-10-CM | POA: Diagnosis not present

## 2017-07-01 DIAGNOSIS — G43009 Migraine without aura, not intractable, without status migrainosus: Secondary | ICD-10-CM | POA: Diagnosis not present

## 2017-07-01 MED ORDER — TOPIRAMATE 25 MG PO TABS: 75.0000 mg | ORAL_TABLET | Freq: Every day | ORAL | 11 refills | Status: DC

## 2017-07-01 NOTE — Progress Notes (Signed)
I have read the note, and I agree with the clinical assessment and plan.  Kalecia Hartney K Ama Mcmaster   

## 2017-07-01 NOTE — Patient Instructions (Signed)
Continue Topamax 75mg  at night will refill for one year F/U yearly and prn  call [phone] for physician referral

## 2017-07-02 DIAGNOSIS — R5383 Other fatigue: Secondary | ICD-10-CM | POA: Diagnosis not present

## 2017-07-02 DIAGNOSIS — R631 Polydipsia: Secondary | ICD-10-CM | POA: Diagnosis not present

## 2017-07-02 DIAGNOSIS — R634 Abnormal weight loss: Secondary | ICD-10-CM | POA: Diagnosis not present

## 2017-07-04 DIAGNOSIS — N401 Enlarged prostate with lower urinary tract symptoms: Secondary | ICD-10-CM | POA: Diagnosis not present

## 2017-07-04 DIAGNOSIS — R338 Other retention of urine: Secondary | ICD-10-CM | POA: Diagnosis not present

## 2017-07-08 DIAGNOSIS — C61 Malignant neoplasm of prostate: Secondary | ICD-10-CM | POA: Diagnosis not present

## 2017-07-08 DIAGNOSIS — R3915 Urgency of urination: Secondary | ICD-10-CM | POA: Diagnosis not present

## 2017-07-14 DIAGNOSIS — N401 Enlarged prostate with lower urinary tract symptoms: Secondary | ICD-10-CM | POA: Diagnosis not present

## 2017-07-14 DIAGNOSIS — R3914 Feeling of incomplete bladder emptying: Secondary | ICD-10-CM | POA: Diagnosis not present

## 2017-07-14 DIAGNOSIS — R3912 Poor urinary stream: Secondary | ICD-10-CM | POA: Diagnosis not present

## 2017-07-15 DIAGNOSIS — R338 Other retention of urine: Secondary | ICD-10-CM | POA: Diagnosis not present

## 2017-07-15 DIAGNOSIS — N401 Enlarged prostate with lower urinary tract symptoms: Secondary | ICD-10-CM | POA: Diagnosis not present

## 2017-07-16 DIAGNOSIS — R3915 Urgency of urination: Secondary | ICD-10-CM | POA: Diagnosis not present

## 2017-07-16 DIAGNOSIS — R350 Frequency of micturition: Secondary | ICD-10-CM | POA: Diagnosis not present

## 2017-07-16 DIAGNOSIS — N401 Enlarged prostate with lower urinary tract symptoms: Secondary | ICD-10-CM | POA: Diagnosis not present

## 2017-07-23 DIAGNOSIS — C61 Malignant neoplasm of prostate: Secondary | ICD-10-CM | POA: Diagnosis not present

## 2017-07-23 DIAGNOSIS — N401 Enlarged prostate with lower urinary tract symptoms: Secondary | ICD-10-CM | POA: Diagnosis not present

## 2017-07-23 DIAGNOSIS — R300 Dysuria: Secondary | ICD-10-CM | POA: Diagnosis not present

## 2017-07-23 DIAGNOSIS — R350 Frequency of micturition: Secondary | ICD-10-CM | POA: Diagnosis not present

## 2017-08-05 DIAGNOSIS — C61 Malignant neoplasm of prostate: Secondary | ICD-10-CM | POA: Diagnosis not present

## 2017-08-05 DIAGNOSIS — R300 Dysuria: Secondary | ICD-10-CM | POA: Diagnosis not present

## 2017-08-05 DIAGNOSIS — R351 Nocturia: Secondary | ICD-10-CM | POA: Diagnosis not present

## 2017-08-05 DIAGNOSIS — N401 Enlarged prostate with lower urinary tract symptoms: Secondary | ICD-10-CM | POA: Diagnosis not present

## 2017-08-05 DIAGNOSIS — D649 Anemia, unspecified: Secondary | ICD-10-CM | POA: Diagnosis not present

## 2017-08-05 DIAGNOSIS — R5383 Other fatigue: Secondary | ICD-10-CM | POA: Diagnosis not present

## 2017-08-05 DIAGNOSIS — Z9289 Personal history of other medical treatment: Secondary | ICD-10-CM | POA: Diagnosis not present

## 2017-08-05 DIAGNOSIS — Z79818 Long term (current) use of other agents affecting estrogen receptors and estrogen levels: Secondary | ICD-10-CM | POA: Diagnosis not present

## 2017-08-05 DIAGNOSIS — M545 Low back pain: Secondary | ICD-10-CM | POA: Diagnosis not present

## 2017-08-05 DIAGNOSIS — N390 Urinary tract infection, site not specified: Secondary | ICD-10-CM | POA: Diagnosis not present

## 2017-08-05 DIAGNOSIS — I499 Cardiac arrhythmia, unspecified: Secondary | ICD-10-CM | POA: Diagnosis not present

## 2017-08-06 ENCOUNTER — Telehealth: Payer: Self-pay | Admitting: Cardiology

## 2017-08-06 NOTE — Telephone Encounter (Signed)
.  Patient c/o Palpitations:  High priority if patient c/o lightheadedness, shortness of breath, or chest pain  1) How long have you had palpitations/irregular HR/ Afib? Are you having the symptoms now? 3 weeks   2) Are you currently experiencing lightheadedness, SOB or CP? SOB and lightheadedness  3) Do you have a history of afib (atrial fibrillation) or irregular heart rhythm? No   4) Have you checked your BP or HR? (document readings if available):   5) Are you experiencing any other symptoms? Yes, patient feels weak and "debilatated" and states that he does not sleep well and is very tired.

## 2017-08-06 NOTE — Telephone Encounter (Signed)
Pt has not called back re: s/s.  He has been scheduled for evaluation here tomorrow.

## 2017-08-06 NOTE — Telephone Encounter (Signed)
Left message to c/b.

## 2017-08-07 ENCOUNTER — Encounter: Payer: Self-pay | Admitting: Physician Assistant

## 2017-08-07 ENCOUNTER — Ambulatory Visit (INDEPENDENT_AMBULATORY_CARE_PROVIDER_SITE_OTHER): Payer: Medicare Other | Admitting: Physician Assistant

## 2017-08-07 VITALS — BP 98/64 | HR 69 | Ht 73.0 in | Wt 168.8 lb

## 2017-08-07 DIAGNOSIS — R002 Palpitations: Secondary | ICD-10-CM

## 2017-08-07 DIAGNOSIS — I491 Atrial premature depolarization: Secondary | ICD-10-CM | POA: Diagnosis not present

## 2017-08-07 NOTE — Telephone Encounter (Signed)
Pt has been arrived for appt here today.  His concerns will be addressed at this visit.

## 2017-08-07 NOTE — Patient Instructions (Signed)
Medication Instructions:  Your physician recommends that you continue on your current medications as directed. Please refer to the Current Medication list given to you today.  Labwork: None  Testing/Procedures: None  Follow-Up: Your physician recommends that you schedule a follow-up appointment in: 4 months with [name].   Any Other Special Instructions Will Be Listed Below (If Applicable).     If you need a refill on your cardiac medications before your next appointment, please call your pharmacy.

## 2017-08-07 NOTE — Progress Notes (Signed)
Cardiology Office Note    Date:  08/07/2017   ID:  Dylan Woods, DOB [birth year], MRN [id number]  PCP:  [name], NP  Cardiologist: [name]   Chief Complaint: SOB and palpitations   History of Present Illness:   Dylan Woods is a 72 y.o. male with hx of malignant neoplasm of prostate On Lupron, IDA and palpitations added to my schedule to SOB and palpitations.   Only seen once by [name] 06/17/16 for palpitation and atypical type chest pain. Advised to reduce caffeine intake and use of valsalva maneuver. Plan to get monitor if recurrent symptoms.   Followed up stress test showed no reversible ischemia. Moderate size and intensity fixed inferoseptal and septal perfusion defect, consider artifact or scar. LVEF 48% with septal hypokinesis to dyskinesis. This is an intermediate risk study.   [name] recommended further testing only if symptoms more worrisome.   Noted irregular HR increasing to 122 while laying during PCP visit 08/05/17. EKG read as sinus rhythm with PACs (strip not available to review). Lab work reassuring except indications for Iron deficiency anemia - personally reviewed with Care Everywhere.   Here today for follow up.  Patient has noted palpitations only at night while laying down.  Occurs every night.  No symptoms.  He denies any exertional chest pain or shortness of breath.  Walks 3 miles every day without any issue.  Denies dizziness, syncope, lower extremity edema, melena or blood in stool or urine.   Past Medical History:  Diagnosis Date  . Degenerative arthritis   . Heart murmur   . History of chest pain    per pt and epic documentation -- cardiac work-up done --- per dr Marlou Porch note felt to not be cardiac related  . Hyperplasia of prostate with lower urinary tract symptoms (LUTS)   . Migraines   . Prostate cancer [facility]) urologist-  dr budzyn/  oncologist-  dr [name]   dx 03-29-2016-- Stage T1c, Gleason 4+5, PSA 5.06, vol 74.51cc---  Hormone therapy (lupron injection) prostate decreased to 47cc; now planned radiactive seed implants 12-18-2016    Past Surgical History:  Procedure Laterality Date  . CARDIOVASCULAR STRESS TEST  06-19-2016   dr Marlou Porch   Intermediate risk nuclear study w/ no reversible ischemia with moderate size and intensity fixed inferoseptal and septal perfusion defect, consider artifact scar/  LVEF nuclear stress 48% (45-54%)  with septal hypokinesis to dyskinesis  . CATARACT EXTRACTION W/ INTRAOCULAR LENS  IMPLANT, BILATERAL  2011;  2004  . CIRCUMCISION  age 84  . CYSTOSCOPY N/A 12/18/2016   Procedure: CYSTOSCOPY FLEXIBLE;  Surgeon: [name], MD;  Location: [facility];  Service: Urology;  Laterality: N/A;  NO SEEDS FOUND IN BLADDER  . GOLD SEED IMPLANT N/A 12/18/2016   Procedure: GOLD SEED IMPLANT;  Surgeon: [name], MD;  Location: [facility];  Service: Urology;  Laterality: N/A;   3 MARKERS  IMPLANTED  . INGUINAL HERNIA REPAIR Right 1992  . KNEE ARTHROSCOPY Left 2003;  2008  . NASAL SINUS SURGERY  2015  . PROSTATE BIOPSY  03-29-2016   dr Pilar Jarvis (office)  . RADIOACTIVE SEED IMPLANT N/A 12/18/2016   Procedure: RADIOACTIVE SEED IMPLANT/BRACHYTHERAPY IMPLANT;  Surgeon: [name], MD;  Location: [facility];  Service: Urology;  Laterality: N/A;    62  SEEDS IMPLANTED  . SPACE OAR INSTILLATION N/A 12/18/2016   Procedure: SPACE OAR INSTILLATION;  Surgeon: [name], MD;  Location: [place];  Service: Urology;  Laterality: N/A;    Current Medications: Prior to Admission medications   Medication Sig Start Date End Date Taking? Authorizing Provider  acyclovir (ZOVIRAX) 200 MG capsule Take 200 mg by mouth daily as needed (for cold sores).     [provider]  ALPRAZolam Duanne Moron) 0.25 MG tablet Take 0.25 mg by mouth as needed for sleep (Taking when going on air flights).     [provider]    CALCIUM-VITAMIN D PO Take 1 tablet by mouth 2 (two) times daily. 600-400mg  tablet     [provider]  Homeopathic Products (AZO CONFIDENCE PO) Take 99.5 mg by mouth as needed.    [provider]  ibuprofen (ADVIL,MOTRIN) 200 MG tablet Take 200 mg by mouth every 6 (six) hours as needed.    [provider]  Leuprolide Acetate (LUPRON IJ) Inject as directed every 4 (four) months.     [provider]  Multiple Vitamin (MULTI-VITAMINS) TABS Take by mouth daily.    [provider]  oxybutynin (DITROPAN) 5 MG tablet Take 1 tablet (5 mg total) by mouth every 8 (eight) hours as needed for bladder spasms. 01/08/17   Bruning, Ashlyn, [name]  potassium gluconate 595 (99 K) MG TABS tablet Take 595 mg by mouth 2 (two) times daily.    [provider]  topiramate (TOPAMAX) 25 MG tablet Take 3 tablets (75 mg total) by mouth at bedtime. 07/01/17   Dennie Bible, NP  traMADol (ULTRAM) 50 MG tablet Take 1 tablet (50 mg total) by mouth every 6 (six) hours as needed for moderate pain. 03/26/17   Bruning, Ashlyn, [name]  valACYclovir (VALTREX) 1000 MG tablet Take 1,000 mg by mouth daily as needed (for cold sores).  11/13/15   [provider]    Allergies:   Ampicillin and Ketorolac   Social History   Socioeconomic History  . Marital status: Widowed    Spouse name: Not on file  . Number of children: 1  . Years of education: college  . Highest education level: Not on file  Occupational History    Employer: [employer]  . Financial resource strain: Not on file  . Food insecurity:    Worry: Not on file    Inability: Not on file  . Transportation needs:    Medical: Not on file    Non-medical: Not on file  Tobacco Use  . Smoking status: Former Smoker    Years: 6.00    Types: Cigarettes    Last attempt to quit: 12/10/1969    Years since quitting: 47.6  . Smokeless tobacco: Never Used   Substance and Sexual Activity  . Alcohol use: Yes    Alcohol/week: 5.4 oz    Types: 3 Cans of beer, 6 Glasses of wine per week  . Drug use: No  . Sexual activity: Not Currently  Lifestyle  . Physical activity:    Days per week: Not on file    Minutes per session: Not on file  . Stress: Not on file  Relationships  . Social connections:    Talks on phone: Not on file    Gets together: Not on file    Attends religious service: Not on file    Active member of club or organization: Not on file    Attends meetings of clubs or organizations: Not on file    Relationship status:  Not on file  Other Topics Concern  . Not on file  Social History Narrative   Patient lives at home alone widowed.   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   Right handed.   College education.   Caffeine one cup daily coffee.     Family History:  The patient's family history includes Cancer in his brother and mother; Parkinsonism in his father.   ROS:   Please see the history of present illness.    ROS All other systems reviewed and are negative.   PHYSICAL EXAM:   VS:  BP 98/64   Pulse 69   Ht 6\' 1"  (1.854 m)   Wt 168 lb 12.8 oz (76.6 kg)   SpO2 95%   BMI 22.27 kg/m    GEN: Well nourished, well developed, in no acute distress  HEENT: normal  Neck: no JVD, carotid bruits, or masses Cardiac: RRR; no murmurs, rubs, or gallops,no edema  Respiratory:  clear to auscultation bilaterally, normal work of breathing GI: soft, nontender, nondistended, + BS MS: no deformity or atrophy  Skin: warm and dry, no rash Neuro:  Alert and Oriented x 3, Strength and sensation are intact Psych: euthymic mood, full affect  Wt Readings from Last 3 Encounters:  08/07/17 168 lb 12.8 oz (76.6 kg)  07/01/17 171 lb 3.2 oz (77.7 kg)  03/26/17 173 lb (78.5 kg)      Studies/Labs Reviewed:   EKG:  EKG is ordered today.  The ekg ordered today demonstrates NSR at rate of 67 bpm with PACs  Recent  Labs: 12/11/2016: ALT 20; Platelets 377 12/18/2016: BUN 24; Creatinine, Ser 0.70; Hemoglobin 14.3; Potassium 3.5; Sodium 145   Lipid Panel No results found for: CHOL, TRIG, HDL, CHOLHDL, VLDL, LDLCALC, LDLDIRECT  Additional studies/ records that were reviewed today include:   As above  ASSESSMENT & PLAN:    1. Nocturnal Palpitation - PAC noted. Differential diagnosis is paroxysmal atrial tachycardia, PSVT, sinus tachycardia, atrial fibrillation, ventricular tachycardia.  Discussed evaluation with echocardiogram versus Holter monitor versus low-dose rate controlling agent.  However, patient wants to defer for now.  He thinks his symptoms might be due to anemia versus dehydration.  He not drinking enough  aater due to OAB.  He wants to try  hydration and Iron therapy before any of above recommendations. He will let us if know interested or worsening of symptoms. Encouraged to go to ER or call EMS if non resolving palpitations or syncope. No stressor or emotionally drained before going to bed.    Medication Adjustments/Labs and Tests Ordered: Current medicines are reviewed at length with the patient today.  Concerns regarding medicines are outlined above.  Medication changes, Labs and Tests ordered today are listed in the Patient Instructions below. Patient Instructions  Medication Instructions:  Your physician recommends that you continue on your current medications as directed. Please refer to the Current Medication list given to you today.  Labwork: None  Testing/Procedures: None  Follow-Up: Your physician recommends that you schedule a follow-up appointment in: 4 months with [name].   Any Other Special Instructions Will Be Listed Below (If Applicable).     If you need a refill on your cardiac medications before your next appointment, please call your pharmacy.      Dylan Woods, [place]  08/07/2017 11:13 AM    [address], [place], [place]  [address] Phone: [phone]; Fax: [phone]

## 2017-08-08 ENCOUNTER — Encounter (HOSPITAL_COMMUNITY): Payer: Self-pay | Admitting: Emergency Medicine

## 2017-08-08 ENCOUNTER — Emergency Department (HOSPITAL_COMMUNITY)
Admission: EM | Admit: 2017-08-08 | Discharge: 2017-08-08 | Disposition: A | Discharge status: Home / Self Care | Payer: Medicare Other | Attending: Emergency Medicine | Admitting: Emergency Medicine

## 2017-08-08 DIAGNOSIS — Z79899 Other long term (current) drug therapy: Secondary | ICD-10-CM | POA: Insufficient documentation

## 2017-08-08 DIAGNOSIS — Z87891 Personal history of nicotine dependence: Secondary | ICD-10-CM | POA: Insufficient documentation

## 2017-08-08 DIAGNOSIS — Z8546 Personal history of malignant neoplasm of prostate: Secondary | ICD-10-CM | POA: Diagnosis not present

## 2017-08-08 DIAGNOSIS — R42 Dizziness and giddiness: Secondary | ICD-10-CM | POA: Diagnosis not present

## 2017-08-08 DIAGNOSIS — R531 Weakness: Secondary | ICD-10-CM | POA: Insufficient documentation

## 2017-08-08 LAB — BASIC METABOLIC PANEL
ANION GAP: 9 (ref 5–15)
BUN: 22 mg/dL — ABNORMAL HIGH (ref 6–20)
CHLORIDE: 109 mmol/L (ref 101–111)
CO2: 24 mmol/L (ref 22–32)
CREATININE: 0.68 mg/dL (ref 0.61–1.24)
Calcium: 9.5 mg/dL (ref 8.9–10.3)
GFR calc non Af Amer: >60 mL/min (ref >60)
Glucose, Bld: 117 mg/dL — ABNORMAL HIGH (ref 65–99)
Potassium: 3.7 mmol/L (ref 3.5–5.1)
Sodium: 142 mmol/L (ref 135–145)

## 2017-08-08 NOTE — ED Notes (Signed)
Patient went to [employer] and is currently eating his subway in the lobby.

## 2017-08-08 NOTE — ED Triage Notes (Signed)
Pt started on new medication,Desmopressin,  which can effect his sodium level. Today when woke up felt fatigue and dizzy. Called PCP and was told to go to ED for blood work to check sodium.

## 2017-08-08 NOTE — ED Provider Notes (Signed)
[facility] DEPT Provider Note   CSN: [id number] Arrival date & time: 08/08/17  1640     History   Chief Complaint Chief Complaint  Patient presents with  . sodium level checked  . Fatigue  . Dizziness    HPI SHIHEEM CORPORAN is a 72 y.o. male.  HPI FARDEEN STEINBERGER is a 72 y.o. male with hx of prostate ca, palpitations, presents to ED with complaint of dizziness and weakness.  Patient states he was started on new medication yesterday by his urologist, desmopressin, states that he took 2 tablets last night for the first time.  He states About an hour after taking medication he started feeling dizzy and lightheaded.  He states that he thought it was a just a side effect of the medicine and went to sleep.  This morning his symptoms did not improve so he called an on-call urologist.  He was told that if he was not feeling any better to come to emergency department to check his sodium level.  Patient states he has been also suggested to drink sports drinks and he has drink sports drinks all afternoon.  He states he is feeling much better at this time.  He denies any chest pain, no shortness of breath, no numbness or weakness in extremities.  No difficulty ambulating.  Past Medical History:  Diagnosis Date  . Degenerative arthritis   . Heart murmur   . History of chest pain    per pt and epic documentation -- cardiac work-up done --- per dr Marlou Porch note felt to not be cardiac related  . Hyperplasia of prostate with lower urinary tract symptoms (LUTS)   . Migraines   . Prostate cancer [facility]) urologist-  dr budzyn/  oncologist-  dr [name]   dx 03-29-2016-- Stage T1c, Gleason 4+5, PSA 5.06, vol 74.51cc--- Hormone therapy (lupron injection) prostate decreased to 47cc; now planned radiactive seed implants 12-18-2016    Patient Active Problem List   Diagnosis Date Noted  . PAC (premature atrial contraction) 06/17/2016  . Palpitations 05/22/2016  . Malignant  neoplasm of prostate ([place]) 05/22/2016  . 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 Somnolence, daytime 12/27/2014  . Common migraine 06/21/2014  . Inguinal hernia, left 09/23/2013  . Headache 02/10/2013    Past Surgical History:  Procedure Laterality Date  . CARDIOVASCULAR STRESS TEST  06-19-2016   dr Marlou Porch   Intermediate risk nuclear study w/ no reversible ischemia with moderate size and intensity fixed inferoseptal and septal perfusion defect, consider artifact scar/  LVEF nuclear stress 48% (45-54%)  with septal hypokinesis to dyskinesis  . CATARACT EXTRACTION W/ INTRAOCULAR LENS  IMPLANT, BILATERAL  2011;  2004  . CIRCUMCISION  age 89  . CYSTOSCOPY N/A 12/18/2016   Procedure: CYSTOSCOPY FLEXIBLE;  Surgeon: [name], MD;  Location: [facility];  Service: Urology;  Laterality: N/A;  NO SEEDS FOUND IN BLADDER  . GOLD SEED IMPLANT N/A 12/18/2016   Procedure: GOLD SEED IMPLANT;  Surgeon: [name], MD;  Location: [facility];  Service: Urology;  Laterality: N/A;   3 MARKERS  IMPLANTED  . INGUINAL HERNIA REPAIR Right 1992  . KNEE ARTHROSCOPY Left 2003;  2008  . NASAL SINUS SURGERY  2015  . PROSTATE BIOPSY  03-29-2016   dr Pilar Jarvis (office)  . RADIOACTIVE SEED IMPLANT N/A 12/18/2016   Procedure: RADIOACTIVE SEED IMPLANT/BRACHYTHERAPY IMPLANT;  Surgeon: [name], MD;  Location: [facility];  Service: Urology;  Laterality: N/A;    62  SEEDS IMPLANTED  . SPACE OAR INSTILLATION N/A 12/18/2016   Procedure: SPACE OAR INSTILLATION;  Surgeon: [name], MD;  Location: [facility];  Service: Urology;  Laterality: N/A;        Home Medications    Prior to Admission medications   Medication Sig Start Date End Date Taking? Authorizing Provider  acyclovir (ZOVIRAX) 200 MG capsule Take 200 mg by mouth daily as needed (for cold sores).     [provider]  ALPRAZolam Duanne Moron) 0.25 MG tablet Take  0.25 mg by mouth as needed for sleep (Taking when going on air flights).     [provider]  CALCIUM-VITAMIN D PO Take 1 tablet by mouth 2 (two) times daily. 600-400mg  tablet     [provider]  Homeopathic Products (AZO CONFIDENCE PO) Take 99.5 mg by mouth as needed.    [provider]  ibuprofen (ADVIL,MOTRIN) 200 MG tablet Take 200 mg by mouth every 6 (six) hours as needed.    [provider]  Leuprolide Acetate (LUPRON IJ) Inject as directed every 4 (four) months.     [provider]  mirabegron ER (MYRBETRIQ) 50 MG TB24 tablet Take 1 tablet by mouth daily.    [provider]  Multiple Vitamin (MULTI-VITAMINS) TABS Take by mouth daily.    [provider]  oxybutynin (DITROPAN) 5 MG tablet Take 1 tablet (5 mg total) by mouth every 8 (eight) hours as needed for bladder spasms. 01/08/17   Bruning, Ashlyn, [name]  potassium gluconate 595 (99 K) MG TABS tablet Take 595 mg by mouth 2 (two) times daily.    [provider]  tamsulosin (FLOMAX) 0.4 MG CAPS capsule Take 0.4 mg by mouth daily.    [provider]  topiramate (TOPAMAX) 25 MG tablet Take 3 tablets (75 mg total) by mouth at bedtime. 07/01/17   Dennie Bible, NP  traMADol (ULTRAM) 50 MG tablet Take 1 tablet (50 mg total) by mouth every 6 (six) hours as needed for moderate pain. 03/26/17   Bruning, Ashlyn, [name]  valACYclovir (VALTREX) 1000 MG tablet Take 1,000 mg by mouth daily as needed (for cold sores).  11/13/15   [provider]    Family History Family History  Problem Relation Age of Onset  . Parkinsonism Father   . Cancer Mother        lymphoma  . Cancer Brother        prostate ca s/p prostatectomy    Social History Social History   Tobacco Use  . Smoking status: Former Smoker    Years: 6.00    Types: Cigarettes    Last attempt to quit: 12/10/1969    Years since quitting: 47.6  . Smokeless tobacco: Never Used  Substance Use  Topics  . Alcohol use: Yes    Alcohol/week: 5.4 oz    Types: 3 Cans of beer, 6 Glasses of wine per week  . Drug use: No     Allergies   Ampicillin and Ketorolac   Review of Systems Review of Systems  Constitutional: Positive for fatigue. Negative for chills and fever.  Respiratory: Negative for cough, chest tightness and shortness of breath.   Cardiovascular: Negative for chest pain, palpitations and leg swelling.  Gastrointestinal: Negative for abdominal distention, abdominal pain, diarrhea, nausea and vomiting.  Genitourinary: Negative for dysuria, frequency, hematuria and urgency.  Musculoskeletal: Negative for arthralgias, myalgias, neck pain and neck stiffness.  Skin: Negative for  rash.  Allergic/Immunologic: Negative for immunocompromised state.  Neurological: Positive for dizziness, weakness and light-headedness. Negative for numbness and headaches.  All other systems reviewed and are negative.    Physical Exam Updated Vital Signs BP (!) 141/76   Pulse 69   Temp 97.6 F (36.4 C) (Oral)   Resp 16   SpO2 99%   Physical Exam  Constitutional: He is oriented to person, place, and time. He appears well-developed and well-nourished. No distress.  HENT:  Head: Normocephalic and atraumatic.  Eyes: Pupils are equal, round, and reactive to light. Conjunctivae and EOM are normal.  Neck: Neck supple.  Cardiovascular: Normal rate, regular rhythm and normal heart sounds.  Pulmonary/Chest: Effort normal. No respiratory distress. He has no wheezes. He has no rales.  Abdominal: Soft. Bowel sounds are normal. He exhibits no distension. There is no tenderness. There is no rebound.  Musculoskeletal: He exhibits no edema.  Neurological: He is alert and oriented to person, place, and time.  Skin: Skin is warm and dry.  Nursing note and vitals reviewed.    ED Treatments / Results  Labs (all labs ordered are listed, but only abnormal results are displayed) Labs Reviewed  BASIC  METABOLIC PANEL - Abnormal; Notable for the following components:      Result Value   Glucose, Bld 117 (*)    BUN 22 (*)    All other components within normal limits    EKG None  Radiology No results found.  Procedures Procedures (including critical care time)  Medications Ordered in ED Medications - No data to display   Initial Impression / Assessment and Plan / ED Course  I have reviewed the triage vital signs and the nursing notes.  Pertinent labs & imaging results that were available during my care of the patient were reviewed by me and considered in my medical decision making (see chart for details).     Patient emergency department with generalized weakness and dizziness that started last night and persisted through this morning.  He is actually feeling much better right now and states he only wants his sodium levels checked.  He states he was scared to go to bed without knowing that his sodium levels are okay.  His basic metabolic panel shows normal sodium.  His glucose is 117, BUN is 22, however it looks like it is at baseline for him.  His exam is unremarkable and nonfocal.  His vital signs are normal.  I discussed with him possibly doing further testing here tonight, however he stated he feels better and would like to go home.  He will stop taking desmopressin.  He will follow-up with family doctor and I told him to return back to the ER if feeling worse.  Vitals:   08/08/17 1648 08/08/17 2102  BP: (!) 130/94 (!) 141/76  Pulse: 74 69  Resp: 18 16  Temp: 97.6 F (36.4 C)   TempSrc: Oral   SpO2: 98% 99%     Final Clinical Impressions(s) / ED Diagnoses   Final diagnoses:  Dizziness  Weakness    ED Discharge Orders    None       Janee Morn 08/08/17 2145    Dorie Rank, MD 08/09/17 Holli Humbles    Dorie Rank, MD 08/09/17 [phone]

## 2017-08-08 NOTE — Discharge Instructions (Addendum)
Your sodium levels are normal today.  Your symptoms could be a side effect of the medication.  If you are not improving or feeling worse tomorrow, please return back to emergency department for further testing.

## 2017-08-15 DIAGNOSIS — R3914 Feeling of incomplete bladder emptying: Secondary | ICD-10-CM | POA: Diagnosis not present

## 2017-08-15 DIAGNOSIS — N401 Enlarged prostate with lower urinary tract symptoms: Secondary | ICD-10-CM | POA: Diagnosis not present

## 2017-08-15 DIAGNOSIS — R338 Other retention of urine: Secondary | ICD-10-CM | POA: Diagnosis not present

## 2017-08-21 DIAGNOSIS — R351 Nocturia: Secondary | ICD-10-CM | POA: Diagnosis not present

## 2017-08-21 DIAGNOSIS — N401 Enlarged prostate with lower urinary tract symptoms: Secondary | ICD-10-CM | POA: Diagnosis not present

## 2017-08-21 DIAGNOSIS — R3914 Feeling of incomplete bladder emptying: Secondary | ICD-10-CM | POA: Diagnosis not present

## 2017-08-22 DIAGNOSIS — R3914 Feeling of incomplete bladder emptying: Secondary | ICD-10-CM | POA: Diagnosis not present

## 2017-08-22 DIAGNOSIS — R300 Dysuria: Secondary | ICD-10-CM | POA: Diagnosis not present

## 2017-08-22 DIAGNOSIS — C61 Malignant neoplasm of prostate: Secondary | ICD-10-CM | POA: Diagnosis not present

## 2017-08-25 DIAGNOSIS — C61 Malignant neoplasm of prostate: Secondary | ICD-10-CM | POA: Diagnosis not present

## 2017-08-25 DIAGNOSIS — N401 Enlarged prostate with lower urinary tract symptoms: Secondary | ICD-10-CM | POA: Diagnosis not present

## 2017-08-25 DIAGNOSIS — R3914 Feeling of incomplete bladder emptying: Secondary | ICD-10-CM | POA: Diagnosis not present

## 2017-08-25 DIAGNOSIS — R300 Dysuria: Secondary | ICD-10-CM | POA: Diagnosis not present

## 2017-08-25 DIAGNOSIS — R350 Frequency of micturition: Secondary | ICD-10-CM | POA: Diagnosis not present

## 2017-08-27 DIAGNOSIS — R3914 Feeling of incomplete bladder emptying: Secondary | ICD-10-CM | POA: Diagnosis not present

## 2017-08-27 DIAGNOSIS — R351 Nocturia: Secondary | ICD-10-CM | POA: Diagnosis not present

## 2017-08-27 DIAGNOSIS — C61 Malignant neoplasm of prostate: Secondary | ICD-10-CM | POA: Diagnosis not present

## 2017-08-27 DIAGNOSIS — N401 Enlarged prostate with lower urinary tract symptoms: Secondary | ICD-10-CM | POA: Diagnosis not present

## 2017-09-16 DIAGNOSIS — N3000 Acute cystitis without hematuria: Secondary | ICD-10-CM | POA: Diagnosis not present

## 2017-09-16 DIAGNOSIS — R102 Pelvic and perineal pain: Secondary | ICD-10-CM | POA: Diagnosis not present

## 2017-09-16 DIAGNOSIS — R338 Other retention of urine: Secondary | ICD-10-CM | POA: Diagnosis not present

## 2017-09-16 DIAGNOSIS — N401 Enlarged prostate with lower urinary tract symptoms: Secondary | ICD-10-CM | POA: Diagnosis not present

## 2017-09-24 DIAGNOSIS — R3915 Urgency of urination: Secondary | ICD-10-CM | POA: Insufficient documentation

## 2017-09-24 DIAGNOSIS — R350 Frequency of micturition: Secondary | ICD-10-CM | POA: Diagnosis not present

## 2017-09-24 DIAGNOSIS — N401 Enlarged prostate with lower urinary tract symptoms: Secondary | ICD-10-CM | POA: Diagnosis not present

## 2017-09-24 DIAGNOSIS — Z87891 Personal history of nicotine dependence: Secondary | ICD-10-CM | POA: Diagnosis not present

## 2017-09-24 DIAGNOSIS — N3281 Overactive bladder: Secondary | ICD-10-CM | POA: Insufficient documentation

## 2017-09-24 DIAGNOSIS — N320 Bladder-neck obstruction: Secondary | ICD-10-CM | POA: Diagnosis not present

## 2017-09-24 DIAGNOSIS — R338 Other retention of urine: Secondary | ICD-10-CM | POA: Diagnosis not present

## 2017-09-24 DIAGNOSIS — Z8546 Personal history of malignant neoplasm of prostate: Secondary | ICD-10-CM | POA: Diagnosis not present

## 2017-09-24 DIAGNOSIS — Z923 Personal history of irradiation: Secondary | ICD-10-CM | POA: Diagnosis not present

## 2017-09-30 DIAGNOSIS — Z961 Presence of intraocular lens: Secondary | ICD-10-CM | POA: Diagnosis not present

## 2017-09-30 DIAGNOSIS — H401331 Pigmentary glaucoma, bilateral, mild stage: Secondary | ICD-10-CM | POA: Diagnosis not present

## 2017-10-20 DIAGNOSIS — Z8371 Family history of colonic polyps: Secondary | ICD-10-CM | POA: Diagnosis not present

## 2017-10-20 DIAGNOSIS — R198 Other specified symptoms and signs involving the digestive system and abdomen: Secondary | ICD-10-CM | POA: Diagnosis not present

## 2017-10-20 DIAGNOSIS — N320 Bladder-neck obstruction: Secondary | ICD-10-CM | POA: Diagnosis not present

## 2017-10-20 DIAGNOSIS — C61 Malignant neoplasm of prostate: Secondary | ICD-10-CM | POA: Diagnosis not present

## 2017-10-24 DIAGNOSIS — R3914 Feeling of incomplete bladder emptying: Secondary | ICD-10-CM | POA: Diagnosis not present

## 2017-10-24 DIAGNOSIS — N401 Enlarged prostate with lower urinary tract symptoms: Secondary | ICD-10-CM | POA: Diagnosis not present

## 2017-10-24 DIAGNOSIS — C61 Malignant neoplasm of prostate: Secondary | ICD-10-CM | POA: Diagnosis not present

## 2017-10-28 ENCOUNTER — Other Ambulatory Visit: Payer: Self-pay | Admitting: Urology

## 2017-10-29 ENCOUNTER — Encounter (HOSPITAL_BASED_OUTPATIENT_CLINIC_OR_DEPARTMENT_OTHER): Payer: Self-pay | Admitting: *Deleted

## 2017-10-29 ENCOUNTER — Other Ambulatory Visit: Payer: Self-pay

## 2017-10-29 NOTE — Progress Notes (Signed)
Spoke w/ pt via phone for pre-op interview.  Npo after mn.  Arrive at 0930.  Needs hg.  Current ekg in chart and epic.  Will take oxybutynin am dos w/ sips of water.  Reviewed [facility] guidelines, pt to bring meds.

## 2017-10-31 ENCOUNTER — Encounter (HOSPITAL_BASED_OUTPATIENT_CLINIC_OR_DEPARTMENT_OTHER): Payer: Self-pay

## 2017-10-31 ENCOUNTER — Observation Stay (HOSPITAL_BASED_OUTPATIENT_CLINIC_OR_DEPARTMENT_OTHER)
Admission: RE | Admit: 2017-10-31 | Discharge: 2017-11-01 | Disposition: A | Discharge status: Home / Self Care | Payer: Medicare Other | Source: Ambulatory Visit | Attending: Urology | Admitting: Urology

## 2017-10-31 ENCOUNTER — Ambulatory Visit (HOSPITAL_BASED_OUTPATIENT_CLINIC_OR_DEPARTMENT_OTHER): Payer: Medicare Other | Admitting: Certified Registered"

## 2017-10-31 ENCOUNTER — Encounter (HOSPITAL_BASED_OUTPATIENT_CLINIC_OR_DEPARTMENT_OTHER)
Admission: RE | Disposition: A | Discharge status: Home / Self Care | Payer: Self-pay | Source: Ambulatory Visit | Attending: Urology

## 2017-10-31 DIAGNOSIS — Z9889 Other specified postprocedural states: Secondary | ICD-10-CM | POA: Insufficient documentation

## 2017-10-31 DIAGNOSIS — Z923 Personal history of irradiation: Secondary | ICD-10-CM | POA: Insufficient documentation

## 2017-10-31 DIAGNOSIS — Z9841 Cataract extraction status, right eye: Secondary | ICD-10-CM | POA: Diagnosis not present

## 2017-10-31 DIAGNOSIS — N138 Other obstructive and reflux uropathy: Secondary | ICD-10-CM | POA: Diagnosis present

## 2017-10-31 DIAGNOSIS — N401 Enlarged prostate with lower urinary tract symptoms: Principal | ICD-10-CM | POA: Diagnosis present

## 2017-10-31 DIAGNOSIS — N410 Acute prostatitis: Secondary | ICD-10-CM | POA: Diagnosis not present

## 2017-10-31 DIAGNOSIS — Z8546 Personal history of malignant neoplasm of prostate: Secondary | ICD-10-CM | POA: Diagnosis not present

## 2017-10-31 DIAGNOSIS — Z8042 Family history of malignant neoplasm of prostate: Secondary | ICD-10-CM | POA: Diagnosis not present

## 2017-10-31 DIAGNOSIS — Z888 Allergy status to other drugs, medicaments and biological substances status: Secondary | ICD-10-CM | POA: Diagnosis not present

## 2017-10-31 DIAGNOSIS — Z87891 Personal history of nicotine dependence: Secondary | ICD-10-CM | POA: Insufficient documentation

## 2017-10-31 DIAGNOSIS — Z881 Allergy status to other antibiotic agents status: Secondary | ICD-10-CM | POA: Insufficient documentation

## 2017-10-31 DIAGNOSIS — Z883 Allergy status to other anti-infective agents status: Secondary | ICD-10-CM | POA: Diagnosis not present

## 2017-10-31 DIAGNOSIS — R338 Other retention of urine: Secondary | ICD-10-CM | POA: Diagnosis not present

## 2017-10-31 DIAGNOSIS — G43009 Migraine without aura, not intractable, without status migrainosus: Secondary | ICD-10-CM | POA: Diagnosis not present

## 2017-10-31 DIAGNOSIS — Z9842 Cataract extraction status, left eye: Secondary | ICD-10-CM | POA: Diagnosis not present

## 2017-10-31 DIAGNOSIS — Z807 Family history of other malignant neoplasms of lymphoid, hematopoietic and related tissues: Secondary | ICD-10-CM | POA: Insufficient documentation

## 2017-10-31 DIAGNOSIS — C61 Malignant neoplasm of prostate: Secondary | ICD-10-CM | POA: Diagnosis not present

## 2017-10-31 DIAGNOSIS — N411 Chronic prostatitis: Secondary | ICD-10-CM | POA: Diagnosis not present

## 2017-10-31 HISTORY — DX: Presence of other specified devices: Z97.8

## 2017-10-31 HISTORY — DX: Anemia, unspecified: D64.9

## 2017-10-31 HISTORY — PX: TRANSURETHRAL RESECTION OF PROSTATE: SHX73

## 2017-10-31 HISTORY — DX: Palpitations: R00.2

## 2017-10-31 HISTORY — DX: Personal history of irradiation: Z92.3

## 2017-10-31 HISTORY — DX: Personal history of other specified conditions: Z87.898

## 2017-10-31 HISTORY — DX: Presence of urogenital implants: Z96.0

## 2017-10-31 LAB — POCT HEMOGLOBIN-HEMACUE: Hemoglobin: 12.5 g/dL — ABNORMAL LOW (ref 13.0–17.0)

## 2017-10-31 SURGERY — TURP (TRANSURETHRAL RESECTION OF PROSTATE)
Anesthesia: General | Site: Prostate

## 2017-10-31 MED ORDER — OXYCODONE HCL 5 MG PO TABS
5.0000 mg | ORAL_TABLET | ORAL | Status: DC | PRN
  Administered 2017-10-31: 5 mg via ORAL
  Filled 2017-10-31: qty 1

## 2017-10-31 MED ORDER — ONDANSETRON HCL 4 MG/2ML IJ SOLN
INTRAMUSCULAR | Status: AC
  Filled 2017-10-31: qty 2

## 2017-10-31 MED ORDER — BELLADONNA ALKALOIDS-OPIUM 16.2-30 MG RE SUPP
RECTAL | Status: AC
Start: 2017-10-31 — End: ?
  Filled 2017-10-31: qty 1

## 2017-10-31 MED ORDER — BACITRACIN-NEOMYCIN-POLYMYXIN 400-5-5000 EX OINT
TOPICAL_OINTMENT | CUTANEOUS | Status: AC
  Filled 2017-10-31: qty 1

## 2017-10-31 MED ORDER — SENNA 8.6 MG PO TABS
1.0000 | ORAL_TABLET | Freq: Two times a day (BID) | ORAL | Status: DC
  Administered 2017-10-31 (×2): 8.6 mg via ORAL
  Filled 2017-10-31 (×3): qty 1

## 2017-10-31 MED ORDER — FENTANYL CITRATE (PF) 100 MCG/2ML IJ SOLN
25.0000 ug | INTRAMUSCULAR | Status: DC | PRN
  Administered 2017-10-31 (×3): 50 ug via INTRAVENOUS
  Filled 2017-10-31: qty 1

## 2017-10-31 MED ORDER — OXYCODONE HCL 5 MG PO TABS
ORAL_TABLET | ORAL | Status: AC
  Filled 2017-10-31: qty 1

## 2017-10-31 MED ORDER — PROPOFOL 10 MG/ML IV BOLUS
INTRAVENOUS | Status: AC
  Filled 2017-10-31: qty 20

## 2017-10-31 MED ORDER — TRAMADOL HCL 50 MG PO TABS
50.0000 mg | ORAL_TABLET | Freq: Four times a day (QID) | ORAL | Status: DC | PRN
  Administered 2017-10-31 – 2017-11-01 (×3): 50 mg via ORAL
  Filled 2017-10-31: qty 1

## 2017-10-31 MED ORDER — SULFAMETHOXAZOLE-TRIMETHOPRIM 800-160 MG PO TABS
1.0000 | ORAL_TABLET | Freq: Two times a day (BID) | ORAL | Status: DC
  Administered 2017-10-31 (×2): 1 via ORAL
  Filled 2017-10-31 (×3): qty 1

## 2017-10-31 MED ORDER — ACETAMINOPHEN 500 MG PO TABS
500.0000 mg | ORAL_TABLET | Freq: Four times a day (QID) | ORAL | Status: DC | PRN
  Administered 2017-10-31 – 2017-11-01 (×3): 500 mg via ORAL
  Filled 2017-10-31: qty 1

## 2017-10-31 MED ORDER — SODIUM CHLORIDE 0.9 % IR SOLN
3000.0000 mL | Status: DC
  Filled 2017-10-31: qty 3000

## 2017-10-31 MED ORDER — ONDANSETRON HCL 4 MG/2ML IJ SOLN
INTRAMUSCULAR | Status: DC | PRN
  Administered 2017-10-31: 4 mg via INTRAVENOUS

## 2017-10-31 MED ORDER — VALACYCLOVIR HCL 500 MG PO TABS
1000.0000 mg | ORAL_TABLET | Freq: Every day | ORAL | Status: DC | PRN
  Filled 2017-10-31: qty 2

## 2017-10-31 MED ORDER — TOPIRAMATE 25 MG PO TABS
75.0000 mg | ORAL_TABLET | Freq: Every day | ORAL | Status: DC
  Administered 2017-10-31: 75 mg via ORAL
  Filled 2017-10-31 (×2): qty 3

## 2017-10-31 MED ORDER — FENTANYL CITRATE (PF) 100 MCG/2ML IJ SOLN
INTRAMUSCULAR | Status: DC | PRN
  Administered 2017-10-31 (×2): 25 ug via INTRAVENOUS
  Administered 2017-10-31: 50 ug via INTRAVENOUS

## 2017-10-31 MED ORDER — BACITRACIN-NEOMYCIN-POLYMYXIN 400-5-5000 EX OINT
1.0000 "application " | TOPICAL_OINTMENT | Freq: Three times a day (TID) | CUTANEOUS | Status: DC | PRN
  Administered 2017-10-31: 1 via TOPICAL
  Filled 2017-10-31: qty 1

## 2017-10-31 MED ORDER — LACTATED RINGERS IV SOLN
INTRAVENOUS | Status: DC
  Administered 2017-10-31: 10:00:00 via INTRAVENOUS
  Filled 2017-10-31: qty 1000

## 2017-10-31 MED ORDER — ACETAMINOPHEN 500 MG PO TABS
ORAL_TABLET | ORAL | Status: AC
  Filled 2017-10-31: qty 1

## 2017-10-31 MED ORDER — LIDOCAINE 2% (20 MG/ML) 5 ML SYRINGE
INTRAMUSCULAR | Status: AC
  Filled 2017-10-31: qty 5

## 2017-10-31 MED ORDER — BELLADONNA ALKALOIDS-OPIUM 16.2-60 MG RE SUPP
1.0000 | Freq: Four times a day (QID) | RECTAL | Status: DC | PRN
  Administered 2017-10-31 (×2): 1 via RECTAL
  Filled 2017-10-31: qty 1

## 2017-10-31 MED ORDER — FENTANYL CITRATE (PF) 100 MCG/2ML IJ SOLN
INTRAMUSCULAR | Status: AC
  Filled 2017-10-31: qty 2

## 2017-10-31 MED ORDER — ZOLPIDEM TARTRATE 5 MG PO TABS
5.0000 mg | ORAL_TABLET | Freq: Every evening | ORAL | Status: DC | PRN
  Filled 2017-10-31: qty 1

## 2017-10-31 MED ORDER — TRAMADOL HCL 50 MG PO TABS
ORAL_TABLET | ORAL | Status: AC
  Filled 2017-10-31: qty 1

## 2017-10-31 MED ORDER — SODIUM CHLORIDE 0.9 % IR SOLN
3000.0000 mL | Status: DC
  Administered 2017-10-31: 3000 mL
  Filled 2017-10-31: qty 3000

## 2017-10-31 MED ORDER — SODIUM CHLORIDE 0.45 % IV SOLN
INTRAVENOUS | Status: DC
  Filled 2017-10-31 (×2): qty 1000

## 2017-10-31 MED ORDER — CIPROFLOXACIN HCL 250 MG PO TABS
250.0000 mg | ORAL_TABLET | Freq: Two times a day (BID) | ORAL | Status: DC
  Administered 2017-10-31: 250 mg via ORAL
  Filled 2017-10-31 (×2): qty 1

## 2017-10-31 MED ORDER — BELLADONNA ALKALOIDS-OPIUM 16.2-30 MG RE SUPP
RECTAL | Status: AC
  Filled 2017-10-31: qty 1

## 2017-10-31 MED ORDER — LIDOCAINE 2% (20 MG/ML) 5 ML SYRINGE
INTRAMUSCULAR | Status: DC | PRN
  Administered 2017-10-31: 40 mg via INTRAVENOUS

## 2017-10-31 MED ORDER — CIPROFLOXACIN IN D5W 400 MG/200ML IV SOLN
INTRAVENOUS | Status: AC
  Filled 2017-10-31: qty 200

## 2017-10-31 MED ORDER — ADULT MULTIVITAMIN W/MINERALS CH
ORAL_TABLET | Freq: Every day | ORAL | Status: DC
  Filled 2017-10-31: qty 1

## 2017-10-31 MED ORDER — CIPROFLOXACIN IN D5W 400 MG/200ML IV SOLN
400.0000 mg | INTRAVENOUS | Status: AC
  Administered 2017-10-31: 400 mg via INTRAVENOUS
  Filled 2017-10-31: qty 200

## 2017-10-31 MED ORDER — FENTANYL CITRATE (PF) 100 MCG/2ML IJ SOLN
50.0000 ug | Freq: Once | INTRAMUSCULAR | Status: AC
  Administered 2017-10-31: 50 ug via INTRAVENOUS
  Filled 2017-10-31: qty 1

## 2017-10-31 MED ORDER — PROPOFOL 10 MG/ML IV BOLUS
INTRAVENOUS | Status: DC | PRN
  Administered 2017-10-31: 30 mg via INTRAVENOUS
  Administered 2017-10-31: 150 mg via INTRAVENOUS

## 2017-10-31 MED ORDER — SODIUM CHLORIDE 0.9 % IR SOLN
Status: DC | PRN
  Administered 2017-10-31: 15000 mL

## 2017-10-31 MED ORDER — ALPRAZOLAM 0.25 MG PO TABS
0.2500 mg | ORAL_TABLET | ORAL | Status: DC | PRN
  Filled 2017-10-31: qty 1

## 2017-10-31 SURGICAL SUPPLY — 36 items
BAG DRAIN URO-CYSTO SKYTR STRL (DRAIN) ×3 IMPLANT
BAG URINE DRAINAGE (UROLOGICAL SUPPLIES) IMPLANT
BAG URINE LEG 19OZ MD ST LTX (BAG) IMPLANT
CATH FOLEY 2WAY SLVR  5CC 20FR (CATHETERS)
CATH FOLEY 2WAY SLVR  5CC 22FR (CATHETERS)
CATH FOLEY 2WAY SLVR 30CC 22FR (CATHETERS) IMPLANT
CATH FOLEY 2WAY SLVR 5CC 20FR (CATHETERS) IMPLANT
CATH FOLEY 2WAY SLVR 5CC 22FR (CATHETERS) IMPLANT
CATH FOLEY 3WAY 30CC 22F (CATHETERS) ×3 IMPLANT
CATH HEMA 3WAY 30CC 24FR COUDE (CATHETERS) IMPLANT
CATH HEMA 3WAY 30CC 24FR RND (CATHETERS) IMPLANT
CLOTH BEACON ORANGE TIMEOUT ST (SAFETY) ×3 IMPLANT
ELECT REM PT RETURN 9FT ADLT (ELECTROSURGICAL) ×3
ELECTRODE REM PT RTRN 9FT ADLT (ELECTROSURGICAL) ×1 IMPLANT
EVACUATOR MICROVAS BLADDER (UROLOGICAL SUPPLIES) ×3 IMPLANT
GLOVE BIO SURGEON STRL SZ8 (GLOVE) ×3 IMPLANT
GLOVE BIOGEL PI IND STRL 6.5 (GLOVE) ×1 IMPLANT
GLOVE BIOGEL PI IND STRL 7.5 (GLOVE) ×2 IMPLANT
GLOVE BIOGEL PI INDICATOR 6.5 (GLOVE) ×2
GLOVE BIOGEL PI INDICATOR 7.5 (GLOVE) ×4
GLOVE INDICATOR 6.5 STRL GRN (GLOVE) ×3 IMPLANT
GOWN STRL REUS W/TWL XL LVL3 (GOWN DISPOSABLE) ×9 IMPLANT
HOLDER FOLEY CATH W/STRAP (MISCELLANEOUS) IMPLANT
IV NS IRRIG 3000ML ARTHROMATIC (IV SOLUTION) ×15 IMPLANT
KIT TURNOVER CYSTO (KITS) ×3 IMPLANT
LOOP CUT BIPOLAR 24F LRG (ELECTROSURGICAL) IMPLANT
MANIFOLD NEPTUNE II (INSTRUMENTS) ×3 IMPLANT
NS IRRIG 1000ML POUR BTL (IV SOLUTION) ×3 IMPLANT
PACK CYSTO (CUSTOM PROCEDURE TRAY) ×3 IMPLANT
PLUG CATH AND CAP STER (CATHETERS) IMPLANT
SYR 30ML LL (SYRINGE) IMPLANT
SYRINGE IRR TOOMEY STRL 70CC (SYRINGE) ×3 IMPLANT
TUBE CONNECTING 12'X1/4 (SUCTIONS)
TUBE CONNECTING 12X1/4 (SUCTIONS) IMPLANT
TUBING UROLOGY SET (TUBING) ×3 IMPLANT
WATER STERILE IRR 500ML POUR (IV SOLUTION) ×3 IMPLANT

## 2017-10-31 NOTE — H&P (Signed)
H&P  Chief Complaint: urinary retention  History of Present Illness: This 72 year old male presents for channel TURP.  He has been in retention basically since his combination radiotherapy was completed in November 2018.  He underwent Urolift which temporarily relieved his retention in the spring of 2019.  However, he had recurrent retention after that.  He has had a thorough evaluation, and saw Dr. Fredderick Phenix in consultation at [facility].  It was suggested that he undergo channel TURP to try to relieve this retention.  The patient is aware of the procedure, and its risks and complications including but not limited to bleeding, repeat retention, stricture/bladder neck contracture, urinary incontinence as well as anesthetic risks.  He understands these and desires to proceed. Past Medical History:  Diagnosis Date  . Anemia   . Degenerative arthritis   . Foley catheter in place   . Heart murmur   . History of chest pain    per pt and epic documentation -- cardiac work-up done --- per dr Marlou Porch note felt to not be cardiac related  . History of radiation therapy    boost w/ radioactive prostate seed implants, 110Gy; then IMRT  45Gy in 25 fractions , 01-20-2017 to 02-24-2017  . History of urinary retention 12/23/2016   post surgery 12-18-2016  . Hyperplasia of prostate with lower urinary tract symptoms (LUTS)   . Intermittent palpitations    mostly at night--  cardiologist visit w/ dr Marlou Porch 08-07-2017 in epic, ekg showed PACs  . Migraines    neurologist-  dr [name]  . Prostate cancer [facility]) urologist-  dr Prakriti Carignan/  oncologist-  dr [name]   dx 03-29-2016-- Stage T1c, Gleason 4+5, PSA 5.06, vol 74.51cc--- Hormone therapy (lupron injection) prostate decreased to 47cc; 12-18-2016  radiactive seed implants;  completed external beam radiation 02-24-2017    Past Surgical History:  Procedure Laterality Date  . CARDIOVASCULAR STRESS TEST  06-19-2016   dr Marlou Porch   Intermediate risk nuclear study w/ no reversible ischemia with moderate size and intensity fixed inferoseptal and septal perfusion defect, consider artifact scar/  LVEF nuclear stress 48% (45-54%)  with septal hypokinesis to dyskinesis  . CATARACT EXTRACTION W/ INTRAOCULAR LENS  IMPLANT, BILATERAL  2011;  2004  . CIRCUMCISION  age 22  . CYSTOSCOPY N/A 12/18/2016   Procedure: CYSTOSCOPY FLEXIBLE;  Surgeon: [name], MD;  Location: [facility];  Service: Urology;  Laterality: N/A;  NO SEEDS FOUND IN BLADDER  . CYSTOSCOPY WITH INSERTION OF UROLIFT  03/ 2019   dr [name]  w/ anesthesia @ [facility]  . GOLD SEED IMPLANT N/A 12/18/2016   Procedure: GOLD SEED IMPLANT;  Surgeon: [name], MD;  Location: [facility];  Service: Urology;  Laterality: N/A;   3 MARKERS  IMPLANTED  . INGUINAL HERNIA REPAIR Right 1992  . KNEE ARTHROSCOPY Left 2003;  2008  . NASAL SINUS SURGERY  2015  . PROSTATE BIOPSY  03-29-2016   dr Pilar Jarvis (office)  . RADIOACTIVE SEED IMPLANT N/A 12/18/2016   Procedure: RADIOACTIVE SEED IMPLANT/BRACHYTHERAPY IMPLANT;  Surgeon: [name], MD;  Location: [facility];  Service: Urology;  Laterality: N/A;    62  SEEDS IMPLANTED  . SPACE OAR INSTILLATION N/A 12/18/2016   Procedure: SPACE OAR INSTILLATION;  Surgeon: [name], MD;  Location: [facility];  Service: Urology;  Laterality: N/A;    Home Medications:  Allergies as of 10/31/2017  Reactions   Ampicillin Rash   Has patient had a PCN reaction causing immediate rash, facial/tongue/throat swelling, SOB or lightheadedness with hypotension: Yes Has patient had a PCN reaction causing severe rash involving mucus membranes or skin necrosis: No Has patient had a PCN reaction that required hospitalization: No Has patient had a PCN reaction occurring within the last 10 years: No If all of the above answers are "NO", then may proceed with  Cephalosporin use.   Ketorolac Other (See Comments)   "Makes him feel like he is crazy"   Betadine [povidone Iodine] Other (See Comments)   " irritates skin, burns and stings"      Medication List    Notice   Cannot display discharge medications because the patient has not yet been admitted.     Allergies:  Allergies  Allergen Reactions  . Ampicillin Rash    Has patient had a PCN reaction causing immediate rash, facial/tongue/throat swelling, SOB or lightheadedness with hypotension: Yes Has patient had a PCN reaction causing severe rash involving mucus membranes or skin necrosis: No Has patient had a PCN reaction that required hospitalization: No Has patient had a PCN reaction occurring within the last 10 years: No If all of the above answers are "NO", then may proceed with Cephalosporin use.   Marland Kitchen Ketorolac Other (See Comments)    "Makes him feel like he is crazy"  . Betadine [Povidone Iodine] Other (See Comments)    " irritates skin, burns and stings"    Family History  Problem Relation Age of Onset  . Parkinsonism Father   . Cancer Mother        lymphoma  . Cancer Brother        prostate ca s/p prostatectomy    Social History:  reports that he quit smoking about 47 years ago. His smoking use included cigarettes. He quit after 6.00 years of use. He has never used smokeless tobacco. He reports that he drank alcohol. He reports that he does not use drugs.  ROS: A complete review of systems was performed.  All systems are negative except for pertinent findings as noted.  Physical Exam:  Vital signs in last 24 hours:   Constitutional:  Alert and oriented, No acute distress Cardiovascular: Regular rate and rhythm, No JVD Respiratory: Normal respiratory effort, Lungs clear bilaterally GI: Abdomen is soft, nontender, nondistended, no abdominal masses Genitourinary: No CVAT. Normal male phallus, testes are descended bilaterally and non-tender and without masses, scrotum is  normal in appearance without lesions or masses, perineum is normal on inspection. Rectal: Normal sphincter tone, no rectal masses, prostate is non tender and without nodularity. Prostate size is estimated to be 60cc Lymphatic: No lymphadenopathy Neurologic: Grossly intact, no focal deficits Psychiatric: Normal mood and affect  Laboratory Data:  No results for input(s): WBC, HGB, HCT, PLT in the last 72 hours.  No results for input(s): NA, K, CL, GLUCOSE, BUN, CALCIUM, CREATININE in the last 72 hours.  Invalid input(s): CO3   No results found for this or any previous visit (from the past 24 hour(s)). No results found for this or any previous visit (from the past 240 hour(s)).  Renal Function: No results for input(s): CREATININE in the last 168 hours. CrCl cannot be calculated (Patient's most recent lab result is older than the maximum 21 days allowed.).  Radiologic Imaging: No results found.  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

## 2017-10-31 NOTE — OR Nursing (Signed)
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

## 2017-10-31 NOTE — Transfer of Care (Signed)
Immediate Anesthesia Transfer of Care Note  Patient: Dylan Woods  Procedure(s) Performed: TRANSURETHRAL RESECTION OF THE PROSTATE (TURP) (N/A Prostate)  Patient Location: PACU  Anesthesia Type:General  Level of Consciousness: drowsy and patient cooperative  Airway & Oxygen Therapy: Patient Spontanous Breathing and Patient connected to nasal cannula oxygen  Post-op Assessment: Report given to RN and Post -op Vital signs reviewed and stable  Post vital signs: Reviewed and stable  Last Vitals:  Vitals Value Taken Time  BP 121/68 10/31/2017 12:26 PM  Temp    Pulse 58 10/31/2017 12:29 PM  Resp 11 10/31/2017 12:29 PM  SpO2 100 % 10/31/2017 12:29 PM  Vitals shown include unvalidated device data.  Last Pain:  Vitals:   10/31/17 0953  TempSrc:   PainSc: 2       Patients Stated Pain Goal: 2 (75/19/82 4299)  Complications: No apparent anesthesia complications

## 2017-10-31 NOTE — Discharge Instructions (Signed)
Transurethral Resection of the Prostate ° °Care After ° °Refer to this sheet in the next few weeks. These discharge instructions provide you with general information on caring for yourself after you leave the hospital. Your caregiver may also give you specific instructions. Your treatment has been planned according to the most current medical practices available, but unavoidable complications sometimes occur. If you have any problems or questions after discharge, please call your caregiver. ° °HOME CARE INSTRUCTIONS  ° °Medications °· You may receive medicine for pain management. As your level of discomfort decreases, adjustments in your pain medicines may be made.  °· Take all medicines as directed.  °· You may be given a medicine (antibiotic) to kill germs following surgery. Finish all medicines. Let your caregiver know if you have any side effects or problems from the medicine.  °· If you are on aspirin, it would be best not to restart the aspirin until the blood in the urine clears °Hygiene °· You can take a shower after surgery.  °· You should not take a bath while you still have the urethral catheter. °Activity °· You will be encouraged to get out of bed as much as possible and increase your activity level as tolerated.  °· Spend the first week in and around your home. For 3 weeks, avoid the following:  °· Straining.  °· Running.  °· Strenuous work.  °· Walks longer than a few blocks.  °· Riding for extended periods.  °· Sexual relations.  °· Do not lift heavy objects (more than 20 pounds) for at least 1 month. When lifting, use your arms instead of your abdominal muscles.  °· You will be encouraged to walk as tolerated. Do not exert yourself. Increase your activity level slowly. Remember that it is important to keep moving after an operation of any type. This cuts down on the possibility of developing blood clots.  °· Your caregiver will tell you when you can resume driving and light housework. Discuss this  at your first office visit after discharge. °Diet °· No special diet is ordered after a TURP. However, if you are on a special diet for another medical problem, it should be continued.  °· Normal fluid intake is usually recommended.  °· Avoid alcohol and caffeinated drinks for 2 weeks. They irritate the bladder. Decaffeinated drinks are okay.  °· Avoid spicy foods.  °Bladder Function °· For the first 10 days, empty the bladder whenever you feel a definite desire. Do not try to hold the urine for long periods of time.  °· Urinating once or twice a night even after you are healed is not uncommon.  °· You may see some recurrence of blood in the urine after discharge from the hospital. This usually happens within 2 weeks after the procedure.If this occurs, force fluids again as you did in the hospital and reduce your activity.  °Bowel Function °· You may experience some constipation after surgery. This can be minimized by increasing fluids and fiber in your diet. Drink enough water and fluids to keep your urine clear or pale yellow.  °· A stool softener may be prescribed for use at home. Do not strain to move your bowels.  °· If you are requiring increased pain medicine, it is important that you take stool softeners to prevent constipation. This will help to promote proper healing by reducing the need to strain to move your bowels.  °Sexual Activity °· Semen movement in the opposite direction and into the bladder (  retrograde ejaculation) may occur. Since the semen passes into the bladder, cloudy urine can occur the first time you urinate after intercourse. Or, you may not have an ejaculation during erection. Ask your caregiver when you can resume sexual activity. Retrograde ejaculation and reduced semen discharge should not reduce one's pleasure of intercourse.  °Postoperative Visit °· Arrange the date and time of your after surgery visit with your caregiver.  °Return to Work °· After your recovery is complete, you will  be able to return to work and resume all activities. Your caregiver will inform you when you can return to work.  °Foley Catheter Care °A soft, flexible tube (Foley catheter) may have been placed in your bladder to drain urine and fluid. Follow these instructions: °Taking Care of the Catheter °· Keep the area where the catheter leaves your body clean.  °· Attach the catheter to the leg so there is no tension on the catheter.  °· Keep the drainage bag below the level of the bladder, but keep it OFF the floor.  °· Do not take long soaking baths. Your caregiver will give instructions about showering.  °· Wash your hands before touching ANYTHING related to the catheter or bag.  °· Using mild soap and warm water on a washcloth:  °· Clean the area closest to the catheter insertion site using a circular motion around the catheter.  °· Clean the catheter itself by wiping AWAY from the insertion site for several inches down the tube.  °· NEVER wipe upward as this could sweep bacteria up into the urethra (tube in your body that normally drains the bladder) and cause infection.  °· Place a small amount of sterile lubricant at the tip of the penis where the catheter is entering.  °Taking Care of the Drainage Bags °· Two drainage bags may be taken home: a large overnight drainage bag, and a smaller leg bag which fits underneath clothing.  °· It is okay to wear the overnight bag at any time, but NEVER wear the smaller leg bag at night.  °· Keep the drainage bag well below the level of your bladder. This prevents backflow of urine into the bladder and allows the urine to drain freely.  °· Anchor the tubing to your leg to prevent pulling or tension on the catheter. Use tape or a leg strap provided by the hospital.  °· Empty the drainage bag when it is 1/2 to 3/4 full. Wash your hands before and after touching the bag.  °· Periodically check the tubing for kinks to make sure there is no pressure on the tubing which could restrict  the flow of urine.  °Changing the Drainage Bags °· Cleanse both ends of the clean bag with alcohol before changing.  °· Pinch off the rubber catheter to avoid urine spillage during the disconnection.  °· Disconnect the dirty bag and connect the clean one.  °· Empty the dirty bag carefully to avoid a urine spill.  °· Attach the new bag to the leg with tape or a leg strap.  °Cleaning the Drainage Bags °· Whenever a drainage bag is disconnected, it must be cleaned quickly so it is ready for the next use.  °· Wash the bag in warm, soapy water.  °· Rinse the bag thoroughly with warm water.  °· Soak the bag for 30 minutes in a solution of white vinegar and water (1 cup vinegar to 1 quart warm water).  °· Rinse with warm water.  °SEEK MEDICAL   CARE IF:  °· You have chills or night sweats.  °· You are leaking around your catheter or have problems with your catheter. It is not uncommon to have sporadic leakage around your catheter as a result of bladder spasms. If the leakage stops, there is not much need for concern. If you are uncertain, call your caregiver.  °· You develop side effects that you think are coming from your medicines.  °SEEK IMMEDIATE MEDICAL CARE IF:  °· You are suddenly unable to urinate. Check to see if there are any kinks in the drainage tubing that may cause this. If you cannot find any kinks, call your caregiver immediately. This is an emergency.  °· You develop shortness of breath or chest pains.  °· Bleeding persists or clots develop in your urine.  °· You have a fever.  °· You develop pain in your back or over your lower belly (abdomen).  °· You develop pain or swelling in your legs.  °· Any problems you are having get worse rather than better.  °MAKE SURE YOU:  °· Understand these instructions.  °· Will watch your condition.  °· Will get help right away if you are not doing well or get worse.  °Document Released: 04/08/2005 Document Revised: 12/19/2010 Document Reviewed: 11/30/2008 °[employer]®  Patient Information ©2012 [employer].Transurethral Resection of the Prostate °Care After °Refer to this sheet in the next few weeks. These discharge instructions provide you with general information on caring for yourself after you leave the hospital. Your caregiver may also give you specific instructions. Your treatment has been planned according to the most current medical practices available, but unavoidable complications sometimes occur. If you have any problems or questions after discharge, please call your caregiver. °

## 2017-10-31 NOTE — Anesthesia Postprocedure Evaluation (Signed)
Anesthesia Post Note  Patient: Dylan Woods  Procedure(s) Performed: TRANSURETHRAL RESECTION OF THE PROSTATE (TURP) (N/A Prostate)     Patient location during evaluation: PACU Anesthesia Type: General Level of consciousness: awake and alert Pain management: pain level controlled Vital Signs Assessment: post-procedure vital signs reviewed and stable Respiratory status: spontaneous breathing, nonlabored ventilation, respiratory function stable and patient connected to nasal cannula oxygen Cardiovascular status: blood pressure returned to baseline and stable Postop Assessment: no apparent nausea or vomiting Anesthetic complications: no    Last Vitals:  Vitals:   10/31/17 1315 10/31/17 1345  BP: (!) 149/83 (!) 147/61  Pulse: (!) 49 (!) 49  Resp: 12 13  Temp:    SpO2: 100% 99%    Last Pain:  Vitals:   10/31/17 1345  TempSrc:   PainSc: 7                  Tiajuana Amass

## 2017-10-31 NOTE — Anesthesia Preprocedure Evaluation (Signed)
Anesthesia Evaluation  Patient identified by MRN, date of birth, ID band Patient awake    Reviewed: Allergy & Precautions, NPO status , Patient's Chart, lab work & pertinent test results  Airway Mallampati: II  TM Distance: >3 FB Neck ROM: Full    Dental no notable dental hx.    Pulmonary former smoker,    Pulmonary exam normal breath sounds clear to auscultation       Cardiovascular negative cardio ROS Normal cardiovascular exam+ Valvular Problems/Murmurs  Rhythm:Regular Rate:Normal     Neuro/Psych  Headaches, negative psych ROS   GI/Hepatic negative GI ROS, Neg liver ROS,   Endo/Other  negative endocrine ROS  Renal/GU negative Renal ROS     Musculoskeletal  (+) Arthritis ,   Abdominal   Peds  Hematology negative hematology ROS (+)   Anesthesia Other Findings   Reproductive/Obstetrics negative OB ROS                             Lab Results  Component Value Date   WBC 6.4 12/11/2016   HGB 14.3 12/18/2016   HCT 42.0 12/18/2016   MCV 93.3 12/11/2016   PLT 377 12/11/2016   Lab Results  Component Value Date   CREATININE 0.68 08/08/2017   BUN 22 (H) 08/08/2017   NA 142 08/08/2017   K 3.7 08/08/2017   CL 109 08/08/2017   CO2 24 08/08/2017    Anesthesia Physical  Anesthesia Plan  ASA: II  Anesthesia Plan: General   Post-op Pain Management:    Induction: Intravenous  PONV Risk Score and Plan: 3 and Ondansetron, Dexamethasone and Treatment may vary due to age or medical condition  Airway Management Planned: LMA  Additional Equipment:   Intra-op Plan:   Post-operative Plan: Extubation in OR  Informed Consent: I have reviewed the patients History and Physical, chart, labs and discussed the procedure including the risks, benefits and alternatives for the proposed anesthesia with the patient or authorized representative who has indicated his/her understanding and  acceptance.   Dental advisory given  Plan Discussed with: CRNA  Anesthesia Plan Comments:         Anesthesia Quick Evaluation

## 2017-10-31 NOTE — Op Note (Signed)
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  He had a Urolift to attempt to get him off the catheter, but this failed.  He presents at this time for a channel TURP.  I discussed the procedure as well as risks and complications on several episodes with the patient.  He understands these in detail and desires to proceed.  Description of procedure: The patient was properly identified in the holding area.  He received preoperative IV Cipro.  He was taken to the operating room where general anesthetic was administered.  Is placed in the dorsolithotomy position.  Genitalia and perineum were prepped and draped.  Proper timeout was performed.  A 28 French resectoscope sheath was passed using the visual obturator.  There was obvious by lobar obstruction of the bladder from the prostate.  Bladder was inspected circumferentially.  There were no urothelial lesions.  There was inflammatory response to the catheter at the bladder neck.  Ureteral orifices were normal in configuration location.  He did have a small median lobe.  At this point, the resectoscope element and cutting loop were placed.  The median lobe was trimmed, avoiding injury to the ureteral orifice ease.  I then resected the right prostatic lobe, not deeply, just enough to form a channel.  During resection, the Urolift tabs as well as brachii therapy capsules were seen and were liberated.  After resection of the median lobe and the right prostatic  lobe, there was obvious relief of obstruction.  Because of his radiation, I did not want to perform an aggressive resection, and left left prostatic lobe intact.  Care was taken to provide hemostasis using the coag current.  At this point, the prostate chips were irrigated from the bladder.  They were saved for pathology.   radiation oncology was notified about the patient's brachytherapy seeds and will be consulted regarding separation of these from the chips.  At this point, no further chips were seen within the bladder.  There was adequate hemostasis.  The scope was then removed.  A 22 French three-way Foley catheter was then placed, 30 cc of water placed in the balloon and this was hooked to CBI.  This point, the procedure was terminated.  The patient was awakened and taken to the PACU in stable condition, having tolerated the procedure well.

## 2017-10-31 NOTE — Anesthesia Procedure Notes (Signed)
Procedure Name: LMA Insertion Date/Time: 10/31/2017 11:34 AM Performed by: Verlin Grills, CRNA Pre-anesthesia Checklist: Patient identified, Emergency Drugs available, Suction available and Patient being monitored Patient Re-evaluated:Patient Re-evaluated prior to induction Oxygen Delivery Method: Circle system utilized Preoxygenation: Pre-oxygenation with 100% oxygen Induction Type: IV induction Ventilation: Mask ventilation without difficulty LMA: LMA inserted LMA Size: 4.0 Number of attempts: 1 Placement Confirmation: positive ETCO2 and breath sounds checked- equal and bilateral Dental Injury: Teeth and Oropharynx as per pre-operative assessment

## 2017-11-01 DIAGNOSIS — Z923 Personal history of irradiation: Secondary | ICD-10-CM | POA: Diagnosis not present

## 2017-11-01 DIAGNOSIS — N138 Other obstructive and reflux uropathy: Secondary | ICD-10-CM | POA: Diagnosis not present

## 2017-11-01 DIAGNOSIS — N401 Enlarged prostate with lower urinary tract symptoms: Secondary | ICD-10-CM | POA: Diagnosis not present

## 2017-11-01 DIAGNOSIS — Z9842 Cataract extraction status, left eye: Secondary | ICD-10-CM | POA: Diagnosis not present

## 2017-11-01 DIAGNOSIS — Z8546 Personal history of malignant neoplasm of prostate: Secondary | ICD-10-CM | POA: Diagnosis not present

## 2017-11-01 DIAGNOSIS — Z87891 Personal history of nicotine dependence: Secondary | ICD-10-CM | POA: Diagnosis not present

## 2017-11-01 MED ORDER — SENNOSIDES-DOCUSATE SODIUM 8.6-50 MG PO TABS: 1.0000 | ORAL_TABLET | Freq: Two times a day (BID) | ORAL | 0 refills | Status: DC

## 2017-11-01 MED ORDER — SULFAMETHOXAZOLE-TRIMETHOPRIM 800-160 MG PO TABS: 1.0000 | ORAL_TABLET | Freq: Every day | ORAL | 0 refills | Status: DC

## 2017-11-01 MED ORDER — ACETAMINOPHEN 500 MG PO TABS
ORAL_TABLET | ORAL | Status: AC
  Filled 2017-11-01: qty 1

## 2017-11-01 MED ORDER — TRAMADOL HCL 50 MG PO TABS
ORAL_TABLET | ORAL | Status: AC
  Filled 2017-11-01: qty 1

## 2017-11-01 MED ORDER — TRAMADOL HCL 50 MG PO TABS: 50.0000 mg | ORAL_TABLET | Freq: Four times a day (QID) | ORAL | 0 refills | Status: DC | PRN

## 2017-11-01 NOTE — Discharge Summary (Signed)
Physician Discharge Summary  Patient ID: Dylan Woods MRN: [id number] DOB/AGE: 1945/10/05 72 y.o.  Admit date: 10/31/2017 Discharge date: 11/01/2017  Admission Diagnoses:  Discharge Diagnoses:  Active Problems:   Hyperplasia of prostate with urinary obstruction   Discharged Condition: good  Hospital Course: Pt underwent limited transurethral resection of prostate on 10/31/17 for refractory urinary retention. By the AM of 7/13 he is afebrile, pain controlled on PO meds, maintaining PO nutrition and felt to be adequate for discharge with cathter in place. Urine clear off irrigation overnight.   Consults: None  Significant Diagnostic Studies: labs: none  Treatments: surgery: as per above.   Discharge Exam: Blood pressure 112/69, pulse (!) 58, temperature 98.4 F (36.9 C), resp. rate 16, height 6\' 1"  (1.854 m), weight 76.6 kg (168 lb 12.8 oz), SpO2 97 %. General appearance: alert, cooperative, appears stated age and neverous disposition, but very pleasant.  Eyes: negative Nose: Nares normal. Septum midline. Mucosa normal. No drainage or sinus tenderness. Throat: lips, mucosa, and tongue normal; teeth and gums normal Neck: supple, symmetrical, trachea midline Back: symmetric, no curvature. ROM normal. No CVA tenderness. Resp: non-labored on room air Cardio: Nl rate GI: soft, non-tender; bowel sounds normal; no masses,  no organomegaly Male genitalia: normal, urine clear yellow off irrigation.  Extremities: extremities normal, atraumatic, no cyanosis or edema Pulses: 2+ and symmetric Skin: Skin color, texture, turgor normal. No rashes or lesions  Disposition: Discharge disposition: 01-Home or Self Care       Discharge Instructions    Discharge instructions   Complete by:  As directed    You may see some blood in the urine and may have some burning with urination for 48-72 hours. You also may notice that you have to urinate more frequently or urgently after your procedure  which is normal.  You should call should you develop an inability urinate, fever > 101, persistent nausea and vomiting that prevents you from eating or drinking to stay hydrated.  If you have a catheter, you will be taught how to take care of the catheter by the nursing staff prior to discharge from the hospital.  You may periodically feel a strong urge to void with the catheter in place.  This is a bladder spasm and most often can occur when having a bowel movement or moving around. It is typically self-limited and usually will stop after a few minutes.  You may use some Vaseline or Neosporin around the tip of the catheter to reduce friction at the tip of the penis. You may also see some blood in the urine.  A very small amount of blood can make the urine look quite red.  As long as the catheter is draining well, there usually is not a problem.  However, if the catheter is not draining well and is bloody, you should call the office [phone]) to notify us.      Follow-up Information    [name], MD.   Specialty:  Urology Why:  Keep appointment next week for voiding trial Contact information: [place] [address] [phone]           Signed: Alexis Frock 11/01/2017, 7:21 AM

## 2017-11-01 NOTE — Progress Notes (Signed)
Correction to time of dc teaching-- completed at [phone]

## 2017-11-03 ENCOUNTER — Encounter (HOSPITAL_BASED_OUTPATIENT_CLINIC_OR_DEPARTMENT_OTHER): Payer: Self-pay | Admitting: Urology

## 2017-11-04 DIAGNOSIS — L821 Other seborrheic keratosis: Secondary | ICD-10-CM | POA: Diagnosis not present

## 2017-11-04 DIAGNOSIS — L570 Actinic keratosis: Secondary | ICD-10-CM | POA: Diagnosis not present

## 2017-11-04 DIAGNOSIS — D1801 Hemangioma of skin and subcutaneous tissue: Secondary | ICD-10-CM | POA: Diagnosis not present

## 2017-11-04 DIAGNOSIS — D225 Melanocytic nevi of trunk: Secondary | ICD-10-CM | POA: Diagnosis not present

## 2017-11-05 DIAGNOSIS — C61 Malignant neoplasm of prostate: Secondary | ICD-10-CM | POA: Diagnosis not present

## 2017-11-05 DIAGNOSIS — R338 Other retention of urine: Secondary | ICD-10-CM | POA: Diagnosis not present

## 2017-11-07 DIAGNOSIS — R3911 Hesitancy of micturition: Secondary | ICD-10-CM | POA: Diagnosis not present

## 2017-11-07 DIAGNOSIS — R3915 Urgency of urination: Secondary | ICD-10-CM | POA: Diagnosis not present

## 2017-11-20 ENCOUNTER — Telehealth: Payer: Self-pay | Admitting: Internal Medicine

## 2017-11-20 NOTE — Telephone Encounter (Signed)
Received previous gi records for patient from [place]. Patient last seen 7.1.19 but states he is still having same symptoms (change in bowel habits) but is not getting answers there. Patient is requesting to see Dr.Pyrtle. Records placed on his desk for review.

## 2017-11-24 ENCOUNTER — Encounter: Payer: Self-pay | Admitting: Internal Medicine

## 2017-11-24 NOTE — Telephone Encounter (Signed)
Dr. Hilarie Fredrickson reviewed records and accepted patient.  Patient is scheduled for OV on 01-21-18

## 2017-12-02 ENCOUNTER — Ambulatory Visit (INDEPENDENT_AMBULATORY_CARE_PROVIDER_SITE_OTHER): Payer: Medicare Other | Admitting: Cardiology

## 2017-12-02 ENCOUNTER — Encounter: Payer: Self-pay | Admitting: Cardiology

## 2017-12-02 VITALS — BP 104/60 | HR 67 | Ht 73.0 in | Wt 165.0 lb

## 2017-12-02 DIAGNOSIS — C61 Malignant neoplasm of prostate: Secondary | ICD-10-CM

## 2017-12-02 DIAGNOSIS — I491 Atrial premature depolarization: Secondary | ICD-10-CM | POA: Diagnosis not present

## 2017-12-02 DIAGNOSIS — R002 Palpitations: Secondary | ICD-10-CM | POA: Diagnosis not present

## 2017-12-02 NOTE — Progress Notes (Signed)
Cardiology Office Note    Date:  12/02/2017   ID:  Dylan Woods, DOB 1945/12/15, MRN [id number]  PCP:  [name], NP  Cardiologist:   [name], MD   No chief complaint on file.   History of Present Illness:  Dylan Woods is a 72 y.o. male here for the follow up of palpitations, chest pain, shortness of breath.  Prior Visit:  Currently being treated for malignant neoplasm of prostate. He was being seen in the office of [name], [name] on 05/22/16 with some description of new onset heart palpitations with tachycardia. He associated them originally with anxiety and caffeine intake. Modifications and caffeine did not alter his symptoms. He was seen by [name], [name].  Pressure in chest, left side, pushing down. Sometines numbness in left arm, tingling in fingers. Can happen at rest. Wake up with them. Fleeting sharp pain at times.   [place] beach, [facility], head spinning, heart racing, fluttering. Sat down. Uber home. Heart would not stop running. ER [place]. Once he got to the emergency room, no evidence of any dangerous arrhythmias. He did have a treadmill test after overnight observation, negative troponin.  Less frequent episodes currently.   Father may have had heart issues. Mother no heart issues.  He just recently had a second opinion for his prostate cancer at [facility]. He is ready to undergo hormonal therapy here as well as radiation therapy.  He's not had any prior cardiovascular events. He remains quite active. He does describe arthritis of neck, hands, knees. Anxiety surrounding this issue.  12/02/2017- he ended up having a TURP procedure for urinary retention on 11/01/2017. Lupron x 2 years. PSA undetectable. Elastic therapy for compression hose and [place].  Overall is been doing quite well.  Occasionally when he is laying down at night he may feel his heart thumping.  This is normal.  No irregularity.  He has not had any syncope or further  chest pain.  He is very happy to have relief from Foley catheter that he has to wear for approximately 10 months.  He is currently sleeping on an slight incline with pillows.  He says that this helps him with his urinary incontinence.  Past Medical History:  Diagnosis Date  . Anemia   . Degenerative arthritis   . Foley catheter in place   . Heart murmur   . History of chest pain    per pt and epic documentation -- cardiac work-up done --- per dr Marlou Porch note felt to not be cardiac related  . History of radiation therapy    boost w/ radioactive prostate seed implants, 110Gy; then IMRT  45Gy in 25 fractions , 01-20-2017 to 02-24-2017  . History of urinary retention 12/23/2016   post surgery 12-18-2016  . Hyperplasia of prostate with lower urinary tract symptoms (LUTS)   . Intermittent palpitations    mostly at night--  cardiologist visit w/ dr Marlou Porch 08-07-2017 in epic, ekg showed PACs  . Migraines    neurologist-  dr [name]  . Prostate cancer [facility]) urologist-  dr dahlstedt/  oncologist-  dr [name]   dx 03-29-2016-- Stage T1c, Gleason 4+5, PSA 5.06, vol 74.51cc--- Hormone therapy (lupron injection) prostate decreased to 47cc; 12-18-2016  radiactive seed implants;  completed external beam radiation 02-24-2017    Past Surgical History:  Procedure Laterality Date  . CARDIOVASCULAR STRESS TEST  06-19-2016   dr Marlou Porch   Intermediate risk nuclear study w/ no reversible ischemia with  moderate size and intensity fixed inferoseptal and septal perfusion defect, consider artifact scar/  LVEF nuclear stress 48% (45-54%)  with septal hypokinesis to dyskinesis  . CATARACT EXTRACTION W/ INTRAOCULAR LENS  IMPLANT, BILATERAL  2011;  2004  . CIRCUMCISION  age 72  . CYSTOSCOPY N/A 12/18/2016   Procedure: CYSTOSCOPY FLEXIBLE;  Surgeon: [name], MD;  Location: [facility];  Service: Urology;  Laterality: N/A;  NO SEEDS FOUND IN BLADDER  . CYSTOSCOPY WITH INSERTION OF UROLIFT  03/  2019   dr [name]  w/ anesthesia @ [facility]  . GOLD SEED IMPLANT N/A 12/18/2016   Procedure: GOLD SEED IMPLANT;  Surgeon: [name], MD;  Location[facility];  Service: Urology;  Laterality: N/A;   3 MARKERS  IMPLANTED  . INGUINAL HERNIA REPAIR Right 1992  . KNEE ARTHROSCOPY Left 2003;  2008  . NASAL SINUS SURGERY  2015  . PROSTATE BIOPSY  03-29-2016   dr Pilar Jarvis (office)  . RADIOACTIVE SEED IMPLANT N/A 12/18/2016   Procedure: RADIOACTIVE SEED IMPLANT/BRACHYTHERAPY IMPLANT;  Surgeon: [name], MD;  Location: [facility];  Service: Urology;  Laterality: N/A;    62  SEEDS IMPLANTED  . SPACE OAR INSTILLATION N/A 12/18/2016   Procedure: SPACE OAR INSTILLATION;  Surgeon: [name], MD;  Location: [facility];  Service: Urology;  Laterality: N/A;  . TRANSURETHRAL RESECTION OF PROSTATE N/A 10/31/2017   Procedure: TRANSURETHRAL RESECTION OF THE PROSTATE (TURP);  Surgeon: [name], MD;  Location: [facility];  Service: Urology;  Laterality: N/A;    Current Medications: Outpatient Medications Prior to Visit  Medication Sig Dispense Refill  . acetaminophen (TYLENOL) 500 MG tablet Take 500 mg by mouth every 6 (six) hours as needed.    . ALPRAZolam (XANAX) 0.25 MG tablet Take 0.25 mg by mouth as needed for sleep (Taking when going on air flights).     . Ascorbic Acid (SM CHEWABLE VITAMIN C) 500 MG CHEW Chew 1,000 mg by mouth daily.    Marland Kitchen CALCIUM-VITAMIN D PO Take 1 tablet by mouth 2 (two) times daily. 600-400mg  tablet     . Ferrous Sulfate (IRON SUPPLEMENT PO) Take 20 mg by mouth 2 (two) times daily.    . Homeopathic Products (AZO CONFIDENCE PO) Take 99.5 mg by mouth as needed.    Marland Kitchen Leuprolide Acetate (LUPRON IJ) Inject as directed every 4 (four) months.     . Multiple Vitamin (MULTI-VITAMINS) TABS Take by mouth daily.    Marland Kitchen OVER THE COUNTER MEDICATION FLORAVITAL- IRON- HERBS SUPPLEMENT LIQUID---   Takes 70ml twice dialy    . topiramate (TOPAMAX) 25 MG tablet Take 75 mg by mouth at bedtime.    . traMADol (ULTRAM) 50 MG tablet Take 1-2 tablets (50-100 mg total) by mouth every 6 (six) hours as needed for moderate pain or severe pain. Post-operatively 20 tablet 0  . valACYclovir (VALTREX) 1000 MG tablet Take 1,000 mg by mouth daily as needed (for cold sores).     . topiramate (TOPAMAX) 25 MG tablet Take 3 tablets (75 mg total) by mouth at bedtime. (Patient not taking: Reported on 12/02/2017) 90 tablet 11  . senna-docusate (SENOKOT-S) 8.6-50 MG tablet Take 1 tablet by mouth 2 (two) times daily. While taking strong pain meds to prevent constipation (Patient not taking: Reported on 12/02/2017) 20 tablet 0  . sulfamethoxazole-trimethoprim (BACTRIM DS,SEPTRA DS) 800-160 MG tablet Take 1 tablet by mouth daily. X 3 days. Begin day  before next Urology appointment (Patient not taking: Reported on 12/02/2017) 3 tablet 0   No facility-administered medications prior to visit.      Allergies:   Ampicillin; Ketorolac; and Betadine [povidone iodine]   Social History   Socioeconomic History  . Marital status: Widowed    Spouse name: Not on file  . Number of children: 1  . Years of education: college  . Highest education level: Not on file  Occupational History    Employer: [employer]: [employer]  . Financial resource strain: Not on file  . Food insecurity:    Worry: Not on file    Inability: Not on file  . Transportation needs:    Medical: Not on file    Non-medical: Not on file  Tobacco Use  . Smoking status: Former Smoker    Years: 6.00    Types: Cigarettes    Last attempt to quit: 12/10/1969    Years since quitting: 48.0  . Smokeless tobacco: Never Used  Substance and Sexual Activity  . Alcohol use: Not Currently  . Drug use: No  . Sexual activity: Not Currently  Lifestyle  . Physical activity:    Days per week: Not on file    Minutes  per session: Not on file  . Stress: Not on file  Relationships  . Social connections:    Talks on phone: Not on file    Gets together: Not on file    Attends religious service: Not on file    Active member of club or organization: Not on file    Attends meetings of clubs or organizations: Not on file    Relationship status: Not on file  Other Topics Concern  . Not on file  Social History Narrative   Patient lives at home alone widowed.   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   Right handed.   College education.   Caffeine one cup daily coffee.     Family History:  The patient's family history includes Cancer in his brother and mother; Parkinsonism in his father.   ROS:   Please see the history of present illness.    Review of Systems  All other systems reviewed and are negative.     PHYSICAL EXAM:   VS:  BP 104/60   Pulse 67   Ht 6\' 1"  (1.854 m)   Wt 165 lb (74.8 kg)   SpO2 96%   BMI 21.77 kg/m    GEN: Thin, in no acute distress  HEENT: normal  Neck: no JVD, carotid bruits, or masses Cardiac: RRR; no murmurs, rubs, or gallops,no edema  Respiratory:  clear to auscultation bilaterally, normal work of breathing GI: soft, nontender, nondistended, + BS MS: no deformity or atrophy  Skin: warm and dry, no rash Neuro:  Alert and Oriented x 3, Strength and sensation are intact Psych: euthymic mood, full affect   Wt Readings from Last 3 Encounters:  12/02/17 165 lb (74.8 kg)  10/31/17 168 lb 12.8 oz (76.6 kg)  08/07/17 168 lb 12.8 oz (76.6 kg)      Studies/Labs Reviewed:   EKG:  06/17/16-sinus rhythm with PAC heartbeat 61 bpm. No other abnormalities. Personally viewed.  Recent Labs: 12/11/2016: ALT 20; Platelets 377 08/08/2017: BUN 22; Creatinine, Ser 0.68; Potassium 3.7; Sodium 142 10/31/2017: Hemoglobin 12.5   Lipid Panel No results found for: CHOL, TRIG, HDL, CHOLHDL, VLDL, LDLCALC, LDLDIRECT  Additional  studies/ records that were reviewed  today include:  Prior office notes, lab work, EKG reviewed    ASSESSMENT:    1. PAC (premature atrial contraction)   2. Heart palpitations   3. Prostate cancer [facility])      PLAN:  In order of problems listed above:  Atypical chest pain  -Resolved.  Nuclear stress test 06/19/2016 was low risk, no ischemia.  Exercise treadmill test in [facility] was also reportedly normal in the past.  Continue to monitor.  Palpitations  -Rarely will feel a pounding of his heart when laying down.  Overall has not had any extreme racing, no syncope, no irregularity.  He has had PACs in the past.  Prostate cancer  -Currently undetectable PSA.  Recent TURP.  I am fine seeing him back on an as-needed basis.  He will let us know if he has any further issues.       Medication Adjustments/Labs and Tests Ordered: Current medicines are reviewed at length with the patient today.  Concerns regarding medicines are outlined above.  Medication changes, Labs and Tests ordered today are listed in the Patient Instructions below. Patient Instructions  Medication Instructions:  The current medical regimen is effective;  continue present plan and medications.  Follow-Up: Follow up as needed with Dr Marlou Porch.  Thank you for choosing [facility]!!        Signed, [name], MD  12/02/2017 10:23 AM    [address], [place],   [address] Phone: [phone]; Fax: [phone]

## 2017-12-02 NOTE — Patient Instructions (Signed)
Medication Instructions:  The current medical regimen is effective;  continue present plan and medications.  Follow-Up: Follow up as needed with Dr Skains.  Thank you for choosing [facility]!!     

## 2017-12-04 DIAGNOSIS — M7751 Other enthesopathy of right foot: Secondary | ICD-10-CM | POA: Diagnosis not present

## 2017-12-04 DIAGNOSIS — M71571 Other bursitis, not elsewhere classified, right ankle and foot: Secondary | ICD-10-CM | POA: Diagnosis not present

## 2017-12-04 DIAGNOSIS — M21621 Bunionette of right foot: Secondary | ICD-10-CM | POA: Diagnosis not present

## 2017-12-05 DIAGNOSIS — M545 Low back pain: Secondary | ICD-10-CM | POA: Diagnosis not present

## 2017-12-09 DIAGNOSIS — Z79818 Long term (current) use of other agents affecting estrogen receptors and estrogen levels: Secondary | ICD-10-CM | POA: Diagnosis not present

## 2017-12-09 DIAGNOSIS — Z0000 Encounter for general adult medical examination without abnormal findings: Secondary | ICD-10-CM | POA: Diagnosis not present

## 2017-12-09 DIAGNOSIS — F419 Anxiety disorder, unspecified: Secondary | ICD-10-CM | POA: Diagnosis not present

## 2017-12-09 DIAGNOSIS — G44229 Chronic tension-type headache, not intractable: Secondary | ICD-10-CM | POA: Diagnosis not present

## 2017-12-09 DIAGNOSIS — D649 Anemia, unspecified: Secondary | ICD-10-CM | POA: Diagnosis not present

## 2017-12-09 DIAGNOSIS — C61 Malignant neoplasm of prostate: Secondary | ICD-10-CM | POA: Diagnosis not present

## 2017-12-09 DIAGNOSIS — Z87891 Personal history of nicotine dependence: Secondary | ICD-10-CM | POA: Diagnosis not present

## 2017-12-09 DIAGNOSIS — R634 Abnormal weight loss: Secondary | ICD-10-CM | POA: Diagnosis not present

## 2017-12-09 DIAGNOSIS — Z13220 Encounter for screening for lipoid disorders: Secondary | ICD-10-CM | POA: Diagnosis not present

## 2017-12-09 DIAGNOSIS — Z79899 Other long term (current) drug therapy: Secondary | ICD-10-CM | POA: Diagnosis not present

## 2017-12-10 DIAGNOSIS — S32000D Wedge compression fracture of unspecified lumbar vertebra, subsequent encounter for fracture with routine healing: Secondary | ICD-10-CM | POA: Diagnosis not present

## 2017-12-10 DIAGNOSIS — R338 Other retention of urine: Secondary | ICD-10-CM | POA: Diagnosis not present

## 2017-12-10 DIAGNOSIS — M2560 Stiffness of unspecified joint, not elsewhere classified: Secondary | ICD-10-CM | POA: Diagnosis not present

## 2017-12-10 DIAGNOSIS — N401 Enlarged prostate with lower urinary tract symptoms: Secondary | ICD-10-CM | POA: Diagnosis not present

## 2017-12-10 DIAGNOSIS — C61 Malignant neoplasm of prostate: Secondary | ICD-10-CM | POA: Diagnosis not present

## 2017-12-10 DIAGNOSIS — R3911 Hesitancy of micturition: Secondary | ICD-10-CM | POA: Diagnosis not present

## 2017-12-10 DIAGNOSIS — M545 Low back pain: Secondary | ICD-10-CM | POA: Diagnosis not present

## 2017-12-15 DIAGNOSIS — S32000D Wedge compression fracture of unspecified lumbar vertebra, subsequent encounter for fracture with routine healing: Secondary | ICD-10-CM | POA: Diagnosis not present

## 2017-12-15 DIAGNOSIS — M545 Low back pain: Secondary | ICD-10-CM | POA: Diagnosis not present

## 2017-12-15 DIAGNOSIS — M8589 Other specified disorders of bone density and structure, multiple sites: Secondary | ICD-10-CM | POA: Diagnosis not present

## 2017-12-15 DIAGNOSIS — M2560 Stiffness of unspecified joint, not elsewhere classified: Secondary | ICD-10-CM | POA: Diagnosis not present

## 2017-12-15 DIAGNOSIS — Z13820 Encounter for screening for osteoporosis: Secondary | ICD-10-CM | POA: Diagnosis not present

## 2017-12-18 DIAGNOSIS — Z23 Encounter for immunization: Secondary | ICD-10-CM | POA: Diagnosis not present

## 2017-12-18 DIAGNOSIS — Z6822 Body mass index (BMI) 22.0-22.9, adult: Secondary | ICD-10-CM | POA: Diagnosis not present

## 2017-12-18 DIAGNOSIS — M2560 Stiffness of unspecified joint, not elsewhere classified: Secondary | ICD-10-CM | POA: Diagnosis not present

## 2017-12-18 DIAGNOSIS — G44209 Tension-type headache, unspecified, not intractable: Secondary | ICD-10-CM | POA: Diagnosis not present

## 2017-12-18 DIAGNOSIS — F418 Other specified anxiety disorders: Secondary | ICD-10-CM | POA: Diagnosis not present

## 2017-12-18 DIAGNOSIS — R002 Palpitations: Secondary | ICD-10-CM | POA: Diagnosis not present

## 2017-12-18 DIAGNOSIS — N401 Enlarged prostate with lower urinary tract symptoms: Secondary | ICD-10-CM | POA: Diagnosis not present

## 2017-12-18 DIAGNOSIS — Z79818 Long term (current) use of other agents affecting estrogen receptors and estrogen levels: Secondary | ICD-10-CM | POA: Diagnosis not present

## 2017-12-18 DIAGNOSIS — S32000D Wedge compression fracture of unspecified lumbar vertebra, subsequent encounter for fracture with routine healing: Secondary | ICD-10-CM | POA: Diagnosis not present

## 2017-12-18 DIAGNOSIS — D6489 Other specified anemias: Secondary | ICD-10-CM | POA: Diagnosis not present

## 2017-12-18 DIAGNOSIS — M545 Low back pain: Secondary | ICD-10-CM | POA: Diagnosis not present

## 2017-12-18 DIAGNOSIS — R5383 Other fatigue: Secondary | ICD-10-CM | POA: Diagnosis not present

## 2017-12-18 DIAGNOSIS — R51 Headache: Secondary | ICD-10-CM | POA: Diagnosis not present

## 2017-12-18 DIAGNOSIS — C61 Malignant neoplasm of prostate: Secondary | ICD-10-CM | POA: Diagnosis not present

## 2017-12-18 DIAGNOSIS — N3281 Overactive bladder: Secondary | ICD-10-CM | POA: Diagnosis not present

## 2017-12-23 DIAGNOSIS — M2560 Stiffness of unspecified joint, not elsewhere classified: Secondary | ICD-10-CM | POA: Diagnosis not present

## 2017-12-23 DIAGNOSIS — S32000D Wedge compression fracture of unspecified lumbar vertebra, subsequent encounter for fracture with routine healing: Secondary | ICD-10-CM | POA: Diagnosis not present

## 2017-12-23 DIAGNOSIS — M545 Low back pain: Secondary | ICD-10-CM | POA: Diagnosis not present

## 2017-12-25 DIAGNOSIS — S32000D Wedge compression fracture of unspecified lumbar vertebra, subsequent encounter for fracture with routine healing: Secondary | ICD-10-CM | POA: Diagnosis not present

## 2017-12-25 DIAGNOSIS — M2560 Stiffness of unspecified joint, not elsewhere classified: Secondary | ICD-10-CM | POA: Diagnosis not present

## 2017-12-25 DIAGNOSIS — M545 Low back pain: Secondary | ICD-10-CM | POA: Diagnosis not present

## 2017-12-30 DIAGNOSIS — M545 Low back pain: Secondary | ICD-10-CM | POA: Diagnosis not present

## 2017-12-30 DIAGNOSIS — S32000D Wedge compression fracture of unspecified lumbar vertebra, subsequent encounter for fracture with routine healing: Secondary | ICD-10-CM | POA: Diagnosis not present

## 2017-12-30 DIAGNOSIS — M2560 Stiffness of unspecified joint, not elsewhere classified: Secondary | ICD-10-CM | POA: Diagnosis not present

## 2018-01-01 DIAGNOSIS — S32000D Wedge compression fracture of unspecified lumbar vertebra, subsequent encounter for fracture with routine healing: Secondary | ICD-10-CM | POA: Diagnosis not present

## 2018-01-01 DIAGNOSIS — M2560 Stiffness of unspecified joint, not elsewhere classified: Secondary | ICD-10-CM | POA: Diagnosis not present

## 2018-01-01 DIAGNOSIS — M545 Low back pain: Secondary | ICD-10-CM | POA: Diagnosis not present

## 2018-01-02 DIAGNOSIS — C61 Malignant neoplasm of prostate: Secondary | ICD-10-CM | POA: Diagnosis not present

## 2018-01-02 DIAGNOSIS — M2560 Stiffness of unspecified joint, not elsewhere classified: Secondary | ICD-10-CM | POA: Diagnosis not present

## 2018-01-05 DIAGNOSIS — S32000D Wedge compression fracture of unspecified lumbar vertebra, subsequent encounter for fracture with routine healing: Secondary | ICD-10-CM | POA: Diagnosis not present

## 2018-01-05 DIAGNOSIS — M2560 Stiffness of unspecified joint, not elsewhere classified: Secondary | ICD-10-CM | POA: Diagnosis not present

## 2018-01-05 DIAGNOSIS — M545 Low back pain: Secondary | ICD-10-CM | POA: Diagnosis not present

## 2018-01-07 ENCOUNTER — Ambulatory Visit: Payer: Medicare Other | Admitting: Podiatry

## 2018-01-08 DIAGNOSIS — M545 Low back pain: Secondary | ICD-10-CM | POA: Diagnosis not present

## 2018-01-08 DIAGNOSIS — M2560 Stiffness of unspecified joint, not elsewhere classified: Secondary | ICD-10-CM | POA: Diagnosis not present

## 2018-01-08 DIAGNOSIS — M810 Age-related osteoporosis without current pathological fracture: Secondary | ICD-10-CM | POA: Diagnosis not present

## 2018-01-08 DIAGNOSIS — S32000D Wedge compression fracture of unspecified lumbar vertebra, subsequent encounter for fracture with routine healing: Secondary | ICD-10-CM | POA: Diagnosis not present

## 2018-01-09 DIAGNOSIS — R3914 Feeling of incomplete bladder emptying: Secondary | ICD-10-CM | POA: Diagnosis not present

## 2018-01-09 DIAGNOSIS — R300 Dysuria: Secondary | ICD-10-CM | POA: Diagnosis not present

## 2018-01-12 DIAGNOSIS — M545 Low back pain: Secondary | ICD-10-CM | POA: Diagnosis not present

## 2018-01-12 DIAGNOSIS — M2560 Stiffness of unspecified joint, not elsewhere classified: Secondary | ICD-10-CM | POA: Diagnosis not present

## 2018-01-12 DIAGNOSIS — S32000D Wedge compression fracture of unspecified lumbar vertebra, subsequent encounter for fracture with routine healing: Secondary | ICD-10-CM | POA: Diagnosis not present

## 2018-01-14 ENCOUNTER — Encounter: Payer: Self-pay | Admitting: *Deleted

## 2018-01-15 DIAGNOSIS — M2560 Stiffness of unspecified joint, not elsewhere classified: Secondary | ICD-10-CM | POA: Diagnosis not present

## 2018-01-15 DIAGNOSIS — S32000D Wedge compression fracture of unspecified lumbar vertebra, subsequent encounter for fracture with routine healing: Secondary | ICD-10-CM | POA: Diagnosis not present

## 2018-01-15 DIAGNOSIS — M545 Low back pain: Secondary | ICD-10-CM | POA: Diagnosis not present

## 2018-01-19 DIAGNOSIS — M545 Low back pain: Secondary | ICD-10-CM | POA: Diagnosis not present

## 2018-01-19 DIAGNOSIS — M2560 Stiffness of unspecified joint, not elsewhere classified: Secondary | ICD-10-CM | POA: Diagnosis not present

## 2018-01-19 DIAGNOSIS — S32000D Wedge compression fracture of unspecified lumbar vertebra, subsequent encounter for fracture with routine healing: Secondary | ICD-10-CM | POA: Diagnosis not present

## 2018-01-21 ENCOUNTER — Encounter: Payer: Self-pay | Admitting: Internal Medicine

## 2018-01-21 ENCOUNTER — Ambulatory Visit (INDEPENDENT_AMBULATORY_CARE_PROVIDER_SITE_OTHER): Payer: Medicare Other | Admitting: Internal Medicine

## 2018-01-21 ENCOUNTER — Other Ambulatory Visit (INDEPENDENT_AMBULATORY_CARE_PROVIDER_SITE_OTHER): Payer: Medicare Other

## 2018-01-21 ENCOUNTER — Other Ambulatory Visit: Payer: Medicare Other

## 2018-01-21 VITALS — BP 110/60 | HR 60 | Ht 73.0 in | Wt 170.0 lb

## 2018-01-21 DIAGNOSIS — R194 Change in bowel habit: Secondary | ICD-10-CM | POA: Diagnosis not present

## 2018-01-21 DIAGNOSIS — R140 Abdominal distension (gaseous): Secondary | ICD-10-CM

## 2018-01-21 LAB — IGA: IGA: 367 mg/dL (ref 68–378)

## 2018-01-21 LAB — TSH: TSH: 2.34 u[IU]/mL (ref 0.35–4.50)

## 2018-01-21 NOTE — Patient Instructions (Signed)
Your provider has requested that you go to the basement level for lab work before leaving today. Press "B" on the elevator. The lab is located at the first door on the left as you exit the elevator.  If you are age 74 or older, your body mass index should be between 23-30. Your Body mass index is 22.43 kg/m. If this is out of the aforementioned range listed, please consider follow up with your Primary Care Provider.  If you are age 25 or younger, your body mass index should be between 19-25. Your Body mass index is 22.43 kg/m. If this is out of the aformentioned range listed, please consider follow up with your Primary Care Provider.

## 2018-01-21 NOTE — Progress Notes (Signed)
Patient ID: JARRYN ALTLAND, male   DOB: 10/16/1945, 72 y.o.   MRN: [id number] HPI: Damaso Laday is a 72 year old male with a past medical history of colonic diverticulosis, prostate cancer status post brachii therapy and external beam radiation, mild anemia, migraine headaches, osteoporosis who is seen to evaluate change in bowel habit.  He is here alone today.  He was previously seen by Dr. Leonie Douglas at [facility].  He had a colonoscopy which I reviewed performed on 07/16/2016.  This showed multiple medium mouth diverticula in the sigmoid.  Nonbleeding internal hemorrhoids.  The remainder of the exam was unremarkable.  He reports that over the last 9 months or so he has developed a change in bowel habit.  For him this is more frequent stool.  Previously he would have 1-2 bowel movements per day which was his norm for many years.  Over the last 9 months he has had as many as 5 bowel movements in a day.  They have been normal consistency and this is not diarrhea.  He is not having abdominal pain.  He has noticed increased gas, abdominal bloating and distention.  He reports sometimes he will feel the urgency for bowel movement but only pass large amounts of flatus.  Reports a good appetite without nausea or vomiting.  He has lost 10 to 12 pounds unintentionally.  He denies upper GI complaint including no dysphagia, odynophagia, heartburn.  Dr. Oletta Lamas recommended he try Metamucil to help bulk the stool.  He try this and did not see any difference.  He works as an Forensic psychologist.  He is widowed.  He has 1 adult daughter.  He is a past smoker.  No alcohol use.  Over the last year he has had issue with intermittent urinary tract infection and after brachii therapy needed to wear a Foley catheter for the better part of 9 months.  He has restricted his diet to help with bladder spasms associated with his prior bladder outlet obstruction.  He avoids acidic foods, citrus fruits and alcohol.  Past Medical History:   Diagnosis Date  . Anemia   . Bladder outlet obstruction   . Degenerative arthritis   . Diverticulosis   . Foley catheter in place   . Heart murmur   . History of chest pain    per pt and epic documentation -- cardiac work-up done --- per dr Marlou Porch note felt to not be cardiac related  . History of radiation therapy    boost w/ radioactive prostate seed implants, 110Gy; then IMRT  45Gy in 25 fractions , 01-20-2017 to 02-24-2017  . History of urinary retention 12/23/2016   post surgery 12-18-2016  . Hyperplasia of prostate with lower urinary tract symptoms (LUTS)   . Intermittent palpitations    mostly at night--  cardiologist visit w/ dr Marlou Porch 08-07-2017 in epic, ekg showed PACs  . Internal hemorrhoids   . Migraines    neurologist-  dr [name]  . Osteoporosis   . Prostate cancer [facility]) urologist-  dr dahlstedt/  oncologist-  dr [name]   dx 03-29-2016-- Stage T1c, Gleason 4+5, PSA 5.06, vol 74.51cc--- Hormone therapy (lupron injection) prostate decreased to 47cc; 12-18-2016  radiactive seed implants;  completed external beam radiation 02-24-2017    Past Surgical History:  Procedure Laterality Date  . CARDIOVASCULAR STRESS TEST  06-19-2016   dr Marlou Porch   Intermediate risk nuclear study w/ no reversible ischemia with moderate size and intensity fixed inferoseptal and septal perfusion defect, consider artifact scar/  LVEF nuclear stress 48% (45-54%)  with septal hypokinesis to dyskinesis  . CATARACT EXTRACTION W/ INTRAOCULAR LENS  IMPLANT, BILATERAL  2011;  2004  . CIRCUMCISION  age 22  . CYSTOSCOPY N/A 12/18/2016   Procedure: CYSTOSCOPY FLEXIBLE;  Surgeon: [name], MD;  Location: [facility];  Service: Urology;  Laterality: N/A;  NO SEEDS FOUND IN BLADDER  . CYSTOSCOPY WITH INSERTION OF UROLIFT  03/ 2019   dr [name]  w/ anesthesia @ [facility]  . GOLD SEED IMPLANT N/A 12/18/2016   Procedure: GOLD SEED IMPLANT;  Surgeon: [name], MD;   Location: [facility];  Service: Urology;  Laterality: N/A;   3 MARKERS  IMPLANTED  . INGUINAL HERNIA REPAIR Right 1992  . KNEE ARTHROSCOPY Left 2003;  2008  . NASAL SINUS SURGERY  2015  . PROSTATE BIOPSY  03-29-2016   dr Pilar Jarvis (office)  . RADIOACTIVE SEED IMPLANT N/A 12/18/2016   Procedure: RADIOACTIVE SEED IMPLANT/BRACHYTHERAPY IMPLANT;  Surgeon: [name], MD;  Location: [facility];  Service: Urology;  Laterality: N/A;    62  SEEDS IMPLANTED  . SPACE OAR INSTILLATION N/A 12/18/2016   Procedure: SPACE OAR INSTILLATION;  Surgeon: [name], MD;  Location: [facility];  Service: Urology;  Laterality: N/A;  . TRANSURETHRAL RESECTION OF PROSTATE N/A 10/31/2017   Procedure: TRANSURETHRAL RESECTION OF THE PROSTATE (TURP);  Surgeon: [name], MD;  Location: [facility];  Service: Urology;  Laterality: N/A;    Outpatient Medications Prior to Visit  Medication Sig Dispense Refill  . acetaminophen (TYLENOL) 500 MG tablet Take 500 mg by mouth every 6 (six) hours as needed.    . ALPRAZolam (XANAX) 0.25 MG tablet Take 0.25 mg by mouth as needed for sleep (Taking when going on air flights).     . Ascorbic Acid (SM CHEWABLE VITAMIN C) 500 MG CHEW Chew 1,000 mg by mouth daily.    Marland Kitchen CALCIUM-VITAMIN D PO Take 1 tablet by mouth 2 (two) times daily. 600-400mg  tablet     . Ferrous Sulfate (IRON SUPPLEMENT PO) Take 20 mg by mouth 2 (two) times daily.    Marland Kitchen Leuprolide Acetate (LUPRON IJ) Inject as directed every 4 (four) months.     . mirabegron ER (MYRBETRIQ) 25 MG TB24 tablet Take 25 mg by mouth daily.    . Multiple Vitamin (MULTI-VITAMINS) TABS Take by mouth daily.    Marland Kitchen OVER THE COUNTER MEDICATION FLORAVITAL- IRON- HERBS SUPPLEMENT LIQUID---  Takes 19ml twice dialy    . topiramate (TOPAMAX) 25 MG tablet Take 3 tablets (75 mg total) by mouth at bedtime. 90 tablet 11  . traMADol (ULTRAM) 50 MG tablet Take 1-2 tablets (50-100  mg total) by mouth every 6 (six) hours as needed for moderate pain or severe pain. Post-operatively 20 tablet 0  . valACYclovir (VALTREX) 1000 MG tablet Take 1,000 mg by mouth daily as needed (for cold sores).     . Homeopathic Products (AZO CONFIDENCE PO) Take 99.5 mg by mouth as needed.    . topiramate (TOPAMAX) 25 MG tablet Take 75 mg by mouth at bedtime.     No facility-administered medications prior to visit.     Allergies  Allergen Reactions  . Ampicillin Rash    Has patient had a PCN reaction causing immediate rash, facial/tongue/throat swelling, SOB or lightheadedness with hypotension: Yes Has patient had a PCN reaction causing severe rash involving mucus membranes or skin necrosis: No Has patient  had a PCN reaction that required hospitalization: No Has patient had a PCN reaction occurring within the last 10 years: No If all of the above answers are "NO", then may proceed with Cephalosporin use.   Marland Kitchen Ketorolac Other (See Comments)    "Makes him feel like he is crazy"  . Betadine [Povidone Iodine] Other (See Comments)    " irritates skin, burns and stings"    Family History  Problem Relation Age of Onset  . Parkinsonism Father   . Colon polyps Father   . Cancer Mother        lymphoma  . Colon polyps Mother   . Cancer Brother        prostate ca s/p prostatectomy    Social History   Tobacco Use  . Smoking status: Former Smoker    Years: 6.00    Types: Cigarettes    Last attempt to quit: 12/10/1969    Years since quitting: 48.1  . Smokeless tobacco: Never Used  Substance Use Topics  . Alcohol use: Not Currently  . Drug use: No    ROS: As per history of present illness, otherwise negative  BP 110/60   Pulse 60   Ht 6\' 1"  (1.854 m)   Wt 170 lb (77.1 kg)   BMI 22.43 kg/m  Constitutional: Well-developed and well-nourished. No distress. HEENT: Normocephalic and atraumatic.  Conjunctivae are normal.  No scleral icterus. Neck: Neck supple. Trachea  midline. Cardiovascular: Normal rate, regular rhythm and intact distal pulses. No M/R/G Pulmonary/chest: Effort normal and breath sounds normal. No wheezing, rales or rhonchi. Abdominal: Soft, nontender, mildly distended. Bowel sounds active throughout. There are no masses palpable. No hepatosplenomegaly. Extremities: no clubbing, cyanosis, or edema Neurological: Alert and oriented to person place and time. Skin: Skin is warm and dry.  Psychiatric: Normal mood and affect. Behavior is normal.  RELEVANT LABS AND IMAGING: CBC    Component Value Date/Time   WBC 6.4 12/11/2016 1400   RBC 4.46 12/11/2016 1400   HGB 12.5 (L) 10/31/2017 1015   HCT 42.0 12/18/2016 1336   PLT 377 12/11/2016 1400   MCV 93.3 12/11/2016 1400   MCH 31.8 12/11/2016 1400   MCHC 34.1 12/11/2016 1400   RDW 12.9 12/11/2016 1400    CMP     Component Value Date/Time   NA 142 08/08/2017 1701   K 3.7 08/08/2017 1701   CL 109 08/08/2017 1701   CO2 24 08/08/2017 1701   GLUCOSE 117 (H) 08/08/2017 1701   BUN 22 (H) 08/08/2017 1701   CREATININE 0.68 08/08/2017 1701   CALCIUM 9.5 08/08/2017 1701   PROT 6.9 12/11/2016 1400   ALBUMIN 4.0 12/11/2016 1400   AST 22 12/11/2016 1400   ALT 20 12/11/2016 1400   ALKPHOS 65 12/11/2016 1400   BILITOT 0.5 12/11/2016 1400   GFRNONAA >60 08/08/2017 1701   GFRAA >60 08/08/2017 1701   Labs reviewed from primary care dated 12/09/2017 --CMP within normal limits, albumin is 3.8.  Vitamin D normal at 48.  White count 5.3, hemoglobin 13.2, MCV 92, platelet count 262.  Calcium, PTH normal.  ASSESSMENT/PLAN: 72 year old male with a past medical history of colonic diverticulosis, prostate cancer status post brachii therapy and external beam radiation, mild anemia, migraine headaches, osteoporosis who is seen to evaluate change in bowel habit.    1. Change in bowel habits/frequency/abdominal bloating symptom and flatulence--fortunately he is up-to-date with screening colonoscopy and there  are no alarm symptoms that are particularly worrisome.  However he has had  troubling increased frequency of bowel movement as many as 5 times per day.  This is not however diarrhea in nature.  We discussed the differential diagnosis.  I would like to check a TSH to ensure no evidence of thyroid disease, rule out celiac disease but checking celiac panel and check fecal elastase.  Also given his multiple rounds of antibiotics in the past year and the fact that his bowel habit change does seem to correlate with his urologic problems it is possible that his gut micro-biome has been disrupted.  SIBO is also a possibility given gas, bloating, abdominal distention and more frequent stool.  If testing is negative I recommend we consider rifaximin 550 mg 3 times daily x14 days for SIBO.  If all of the above unhelpful consider cross-sectional imaging with CT scan.    DV:[id number], [place] [address], [address] [place], [place] [address]

## 2018-01-22 ENCOUNTER — Telehealth: Payer: Self-pay | Admitting: Internal Medicine

## 2018-01-22 DIAGNOSIS — S32000D Wedge compression fracture of unspecified lumbar vertebra, subsequent encounter for fracture with routine healing: Secondary | ICD-10-CM | POA: Diagnosis not present

## 2018-01-22 DIAGNOSIS — M545 Low back pain: Secondary | ICD-10-CM | POA: Diagnosis not present

## 2018-01-22 DIAGNOSIS — M810 Age-related osteoporosis without current pathological fracture: Secondary | ICD-10-CM | POA: Diagnosis not present

## 2018-01-22 DIAGNOSIS — M2560 Stiffness of unspecified joint, not elsewhere classified: Secondary | ICD-10-CM | POA: Diagnosis not present

## 2018-01-22 NOTE — Telephone Encounter (Signed)
transfered to Dr. Virgina Jock

## 2018-01-28 LAB — TISSUE TRANSGLUTAMINASE, IGA: (TTG) AB, IGA: 1 U/mL

## 2018-01-28 LAB — PANCREATIC ELASTASE, FECAL: Pancreatic Elastase-1, Stool: 416 mcg/g

## 2018-02-02 DIAGNOSIS — M2560 Stiffness of unspecified joint, not elsewhere classified: Secondary | ICD-10-CM | POA: Diagnosis not present

## 2018-02-02 DIAGNOSIS — M545 Low back pain: Secondary | ICD-10-CM | POA: Diagnosis not present

## 2018-02-02 DIAGNOSIS — S32000D Wedge compression fracture of unspecified lumbar vertebra, subsequent encounter for fracture with routine healing: Secondary | ICD-10-CM | POA: Diagnosis not present

## 2018-02-05 DIAGNOSIS — M545 Low back pain: Secondary | ICD-10-CM | POA: Diagnosis not present

## 2018-02-05 DIAGNOSIS — S32000D Wedge compression fracture of unspecified lumbar vertebra, subsequent encounter for fracture with routine healing: Secondary | ICD-10-CM | POA: Diagnosis not present

## 2018-02-05 DIAGNOSIS — M2560 Stiffness of unspecified joint, not elsewhere classified: Secondary | ICD-10-CM | POA: Diagnosis not present

## 2018-02-19 ENCOUNTER — Telehealth: Payer: Self-pay | Admitting: Internal Medicine

## 2018-02-19 DIAGNOSIS — M810 Age-related osteoporosis without current pathological fracture: Secondary | ICD-10-CM | POA: Diagnosis not present

## 2018-02-19 NOTE — Telephone Encounter (Signed)
Patient states at ov on 10.2.19 Dr.Pyrtle discussed starting him on medication rifaximin 550 mg 3 times daily x14 days for SIBO. Patient states he has not heard from pharmacy or our office about this medication further and would like some clarification.

## 2018-02-20 ENCOUNTER — Encounter: Payer: Self-pay | Admitting: *Deleted

## 2018-02-20 MED ORDER — RIFAXIMIN 550 MG PO TABS: 550.0000 mg | ORAL_TABLET | Freq: Three times a day (TID) | ORAL | 0 refills | Status: AC

## 2018-02-20 NOTE — Telephone Encounter (Signed)
Script sent to [facility]. Prior auth completed on cover my meds website and we are awaiting response through insurance. I have advised patient of this and he verbalizes understanding of this.

## 2018-02-20 NOTE — Telephone Encounter (Signed)
I charted that I sent it through [facility] but cannot find the paperwork. Called [facility] and they have no record of it. If you can go ahead that would be great.

## 2018-02-20 NOTE — Telephone Encounter (Signed)
Dylan Woods, was Xifaxan sent through [facility] for this patient? See 01/21/18 lab notes... Im glad to send send again but just didn't want to resend if it was already being worked on.Marland KitchenMarland Kitchen

## 2018-02-23 NOTE — Telephone Encounter (Signed)
[employer] has sent authorization for patient's Xifaxan 550 mg until 05/22/2018.

## 2018-03-02 DIAGNOSIS — R351 Nocturia: Secondary | ICD-10-CM | POA: Diagnosis not present

## 2018-03-02 DIAGNOSIS — N401 Enlarged prostate with lower urinary tract symptoms: Secondary | ICD-10-CM | POA: Diagnosis not present

## 2018-03-02 DIAGNOSIS — C61 Malignant neoplasm of prostate: Secondary | ICD-10-CM | POA: Diagnosis not present

## 2018-03-06 DIAGNOSIS — R05 Cough: Secondary | ICD-10-CM | POA: Diagnosis not present

## 2018-03-06 DIAGNOSIS — Z6824 Body mass index (BMI) 24.0-24.9, adult: Secondary | ICD-10-CM | POA: Diagnosis not present

## 2018-03-06 DIAGNOSIS — R42 Dizziness and giddiness: Secondary | ICD-10-CM | POA: Diagnosis not present

## 2018-03-06 DIAGNOSIS — I951 Orthostatic hypotension: Secondary | ICD-10-CM | POA: Diagnosis not present

## 2018-03-25 DIAGNOSIS — C61 Malignant neoplasm of prostate: Secondary | ICD-10-CM | POA: Diagnosis not present

## 2018-03-31 DIAGNOSIS — H40013 Open angle with borderline findings, low risk, bilateral: Secondary | ICD-10-CM | POA: Diagnosis not present

## 2018-03-31 DIAGNOSIS — H5203 Hypermetropia, bilateral: Secondary | ICD-10-CM | POA: Diagnosis not present

## 2018-04-01 ENCOUNTER — Telehealth: Payer: Self-pay | Admitting: Internal Medicine

## 2018-04-01 NOTE — Telephone Encounter (Signed)
Patient returning Linda's call °

## 2018-04-01 NOTE — Telephone Encounter (Signed)
Left message for pt to call back  °

## 2018-04-01 NOTE — Telephone Encounter (Signed)
Patient calling to update the nurse on his symptoms. Patient states he is feeling better after completing medication rifaximin 550 mg 3, but is now experiencing other issues. Patient states his bowl movements are down to 3 a day now, and he has gained all weight back. Now pt is experiencing gas and bloating. Patient also wanting to know if Dr.Pyrtle wants him to do a recall colon before he turns 45.

## 2018-04-02 NOTE — Telephone Encounter (Signed)
Pt states that the xifaxan has helped his loose stools and he is down to 3/day and he has gained his weight back.He states prior to taking this he was having lots of gas and bloating, this continued while taking it and he is still having issues with this. Reports his belly is distended. Pt wants to know what Dr. Hilarie Fredrickson recommends for the bloating/gas and also wants to know if he needs to have a recall colon prior to turning 78. Please advise.

## 2018-04-02 NOTE — Telephone Encounter (Signed)
Next colonoscopy would be March 2023, recall can be placed  Glad to hear that his loose stools are improved with rifaximin. He can try IBgard over-the-counter per box instructions for abdominal gas and bloating If this is not helping he should let me know

## 2018-04-02 NOTE — Telephone Encounter (Signed)
Left message for pt to call back  °

## 2018-04-03 NOTE — Telephone Encounter (Signed)
Spoke wiith pt and he is aware. Recall in epic.

## 2018-04-29 DIAGNOSIS — C61 Malignant neoplasm of prostate: Secondary | ICD-10-CM | POA: Diagnosis not present

## 2018-05-05 DIAGNOSIS — L820 Inflamed seborrheic keratosis: Secondary | ICD-10-CM | POA: Diagnosis not present

## 2018-05-05 DIAGNOSIS — L821 Other seborrheic keratosis: Secondary | ICD-10-CM | POA: Diagnosis not present

## 2018-05-05 DIAGNOSIS — L570 Actinic keratosis: Secondary | ICD-10-CM | POA: Diagnosis not present

## 2018-05-05 DIAGNOSIS — C4441 Basal cell carcinoma of skin of scalp and neck: Secondary | ICD-10-CM | POA: Diagnosis not present

## 2018-05-05 DIAGNOSIS — D225 Melanocytic nevi of trunk: Secondary | ICD-10-CM | POA: Diagnosis not present

## 2018-05-05 DIAGNOSIS — D485 Neoplasm of uncertain behavior of skin: Secondary | ICD-10-CM | POA: Diagnosis not present

## 2018-05-06 DIAGNOSIS — R3914 Feeling of incomplete bladder emptying: Secondary | ICD-10-CM | POA: Diagnosis not present

## 2018-05-06 DIAGNOSIS — N401 Enlarged prostate with lower urinary tract symptoms: Secondary | ICD-10-CM | POA: Diagnosis not present

## 2018-05-06 DIAGNOSIS — C61 Malignant neoplasm of prostate: Secondary | ICD-10-CM | POA: Diagnosis not present

## 2018-05-06 DIAGNOSIS — R351 Nocturia: Secondary | ICD-10-CM | POA: Diagnosis not present

## 2018-06-02 DIAGNOSIS — C4441 Basal cell carcinoma of skin of scalp and neck: Secondary | ICD-10-CM | POA: Diagnosis not present

## 2018-06-02 DIAGNOSIS — Z85828 Personal history of other malignant neoplasm of skin: Secondary | ICD-10-CM | POA: Diagnosis not present

## 2018-07-07 ENCOUNTER — Ambulatory Visit: Payer: Medicare Other | Admitting: Nurse Practitioner

## 2018-07-07 MED ORDER — TOPIRAMATE 25 MG PO TABS: 75.0000 mg | ORAL_TABLET | Freq: Every day | ORAL | 0 refills | Status: DC

## 2018-07-07 NOTE — Telephone Encounter (Signed)
Chart reviewed and refill is appropriate. Pt must keep f/u on 07/16/18 to continue receiving refills.

## 2018-07-07 NOTE — Addendum Note (Signed)
Addended by: Verlin Grills T on: 07/07/2018 10:08 AM   Modules accepted: Orders

## 2018-07-07 NOTE — Telephone Encounter (Signed)
Pharmacy called in and stated they never received request for pts topiramate (TOPAMAX) 25 MG tablet and would like it to be resent

## 2018-07-13 DIAGNOSIS — R350 Frequency of micturition: Secondary | ICD-10-CM | POA: Diagnosis not present

## 2018-07-13 DIAGNOSIS — R3915 Urgency of urination: Secondary | ICD-10-CM | POA: Diagnosis not present

## 2018-07-13 DIAGNOSIS — N390 Urinary tract infection, site not specified: Secondary | ICD-10-CM | POA: Diagnosis not present

## 2018-07-13 DIAGNOSIS — R300 Dysuria: Secondary | ICD-10-CM | POA: Diagnosis not present

## 2018-07-15 ENCOUNTER — Telehealth: Payer: Self-pay

## 2018-07-15 NOTE — Telephone Encounter (Signed)
I contacted the pt and advised Due to covid-19 concerns our office is limiting the number of patient's coming into the clinic. Our office is providing telephone/video visits during this time. The telephone/video visits will be billed through insurance and due to hippa concerns is not as secure as a face to face encounter. However, once a verbal consent is provided we can schedule this appointment for you.   Pt verbally consented to the telephone call visit with Dr. [name] for tomorrow (07/16/18) Pt confirmed best call back # is [phone]. Pt advised to be available between 2 and 2:30 pm tomorrow.  I have reviewed the med's, allergies and pharmacy for this pt.

## 2018-07-16 ENCOUNTER — Ambulatory Visit (INDEPENDENT_AMBULATORY_CARE_PROVIDER_SITE_OTHER): Payer: Medicare Other | Admitting: Neurology

## 2018-07-16 ENCOUNTER — Other Ambulatory Visit: Payer: Self-pay

## 2018-07-16 ENCOUNTER — Encounter: Payer: Self-pay | Admitting: Neurology

## 2018-07-16 DIAGNOSIS — G43019 Migraine without aura, intractable, without status migrainosus: Secondary | ICD-10-CM | POA: Diagnosis not present

## 2018-07-16 MED ORDER — TOPIRAMATE 25 MG PO TABS: 75.0000 mg | ORAL_TABLET | Freq: Every day | ORAL | 3 refills | Status: DC

## 2018-07-16 NOTE — Progress Notes (Signed)
    Virtual Visit via Telephone Note  I connected with Dylan Woods on 07/16/18 at  2:30 PM EDT by telephone and verified that I am speaking with the correct person using two identifiers.   I discussed the limitations, risks, security and privacy concerns of performing an evaluation and management service by telephone and the availability of in person appointments. I also discussed with the patient that there may be a patient responsible charge related to this service. The patient expressed understanding and agreed to proceed.   History of Present Illness: Dylan Woods is a 73 year old right-handed white male with a history of migraine headache.  The patient has a history of prostate cancer, he is on Lupron shots for this.  He does not believe that the Lupron has any impact on his migraine.  He is on Fosamax secondary to osteoporosis associated with the Lupron treatments, this medication will give him malaise for about 24 hours and he will have some headache with this.  On the Topamax, he has been well controlled otherwise with his headaches.  He occasionally have some very mild discomfort in the right head.  Off of the Topamax, his migraines become much more frequent and more severe.  He is currently on 75 mg of Topamax at night, he tolerates the drug well.  He does have some sleeping problems due to urinary frequency.  He is on DDAVP at night to help reduce his urinary frequency.   Observations/Objective: On the telephone, the patient is alert and cooperative, appears to be oriented.  Speech is well enunciated, not aphasic or dysarthric.  Assessment and Plan: 1.  Migraine headache  The patient appears to be well controlled with his migraine.  We will continue his Topamax, he will follow-up here in 1 year.  A prescription for the Topamax was sent in.  Follow Up Instructions: 1 year follow-up with nurse practitioner.   I discussed the assessment and treatment plan with the patient. The  patient was provided an opportunity to ask questions and all were answered. The patient agreed with the plan and demonstrated an understanding of the instructions.   The patient was advised to call back or seek an in-person evaluation if the symptoms worsen or if the condition fails to improve as anticipated.  I provided 15 minutes of non-face-to-face time during this encounter.   Kathrynn Ducking, MD

## 2018-07-23 ENCOUNTER — Ambulatory Visit (INDEPENDENT_AMBULATORY_CARE_PROVIDER_SITE_OTHER): Payer: Medicare Other

## 2018-07-23 ENCOUNTER — Other Ambulatory Visit: Payer: Self-pay

## 2018-07-23 ENCOUNTER — Encounter: Payer: Self-pay | Admitting: Podiatry

## 2018-07-23 ENCOUNTER — Ambulatory Visit: Payer: Medicare Other

## 2018-07-23 ENCOUNTER — Ambulatory Visit (INDEPENDENT_AMBULATORY_CARE_PROVIDER_SITE_OTHER): Payer: Medicare Other | Admitting: Podiatry

## 2018-07-23 VITALS — Temp 97.3°F

## 2018-07-23 DIAGNOSIS — M7751 Other enthesopathy of right foot: Secondary | ICD-10-CM | POA: Diagnosis not present

## 2018-07-23 DIAGNOSIS — M2011 Hallux valgus (acquired), right foot: Secondary | ICD-10-CM

## 2018-07-23 DIAGNOSIS — R51 Headache: Secondary | ICD-10-CM

## 2018-07-23 DIAGNOSIS — M2012 Hallux valgus (acquired), left foot: Principal | ICD-10-CM

## 2018-07-23 DIAGNOSIS — G8929 Other chronic pain: Secondary | ICD-10-CM | POA: Insufficient documentation

## 2018-07-23 DIAGNOSIS — L84 Corns and callosities: Secondary | ICD-10-CM

## 2018-07-23 DIAGNOSIS — M779 Enthesopathy, unspecified: Secondary | ICD-10-CM

## 2018-07-23 MED ORDER — TRIAMCINOLONE ACETONIDE 10 MG/ML IJ SUSP
10.0000 mg | Freq: Once | INTRAMUSCULAR | Status: AC
  Administered 2018-07-23: 12:00:00 10 mg

## 2018-07-23 NOTE — Patient Instructions (Signed)
Bunion  A bunion is a bump on the base of the big toe that forms when the bones of the big toe joint move out of position. Bunions may be small at first, but they often get larger over time. They can make walking painful. What are the causes? A bunion may be caused by:  Wearing narrow or pointed shoes that force the big toe to press against the other toes.  Abnormal foot development that causes the foot to roll inward (pronate).  Changes in the foot that are caused by certain diseases, such as rheumatoid arthritis or polio.  A foot injury. What increases the risk? The following factors may make you more likely to develop this condition:  Wearing shoes that squeeze the toes together.  Having certain diseases, such as: ? Rheumatoid arthritis. ? Polio. ? Cerebral palsy.  Having family members who have bunions.  Being born with a foot deformity, such as flat feet or low arches.  Doing activities that put a lot of pressure on the feet, such as ballet dancing. What are the signs or symptoms? The main symptom of a bunion is a noticeable bump on the big toe. Other symptoms may include:  Pain.  Swelling around the big toe.  Redness and inflammation.  Thick or hardened skin on the big toe or between the toes.  Stiffness or loss of motion in the big toe.  Trouble with walking. How is this diagnosed? A bunion may be diagnosed based on your symptoms, medical history, and activities. You may have tests, such as:  X-rays. These allow your health care provider to check the position of the bones in your foot and look for damage to your joint. They also help your health care provider determine the severity of your bunion and the best way to treat it.  Joint aspiration. In this test, a sample of fluid is removed from the toe joint. This test may be done if you are in a lot of pain. It helps rule out diseases that cause painful swelling of the joints, such as arthritis. How is this  treated? Treatment depends on the severity of your symptoms. The goal of treatment is to relieve symptoms and prevent the bunion from getting worse. Your health care provider may recommend:  Wearing shoes that have a wide toe box.  Using bunion pads to cushion the affected area.  Taping your toes together to keep them in a normal position.  Placing a device inside your shoe (orthotics) to help reduce pressure on your toe joint.  Taking medicine to ease pain, inflammation, and swelling.  Applying heat or ice to the affected area.  Doing stretching exercises.  Surgery to remove scar tissue and move the toes back into their normal position. This treatment is rare. Follow these instructions at home: Managing pain, stiffness, and swelling   If directed, put ice on the painful area: ? Put ice in a plastic bag. ? Place a towel between your skin and the bag. ? Leave the ice on for 20 minutes, 2-3 times a day. Activity   If directed, apply heat to the affected area before you exercise. Use the heat source that your health care provider recommends, such as a moist heat pack or a heating pad. ? Place a towel between your skin and the heat source. ? Leave the heat on for 20-30 minutes. ? Remove the heat if your skin turns bright red. This is especially important if you are unable to feel pain,   heat, or cold. You may have a greater risk of getting burned.  Do exercises as told by your health care provider. General instructions  Support your toe joint with proper footwear, shoe padding, or taping as told by your health care provider.  Take over-the-counter and prescription medicines only as told by your health care provider.  Keep all follow-up visits as told by your health care provider. This is important. Contact a health care provider if your symptoms:  Get worse.  Do not improve in 2 weeks. Get help right away if you have:  Severe pain and trouble with walking. Summary  A  bunion is a bump on the base of the big toe that forms when the bones of the big toe joint move out of position.  Bunions can make walking painful.  Treatment depends on the severity of your symptoms.  Support your toe joint with proper footwear, shoe padding, or taping as told by your health care provider. This information is not intended to replace advice given to you by your health care provider. Make sure you discuss any questions you have with your health care provider. Document Released: 04/08/2005 Document Revised: 08/19/2017 Document Reviewed: 08/19/2017 Elsevier Interactive Patient Education  2019 [employer] Inc.  

## 2018-07-23 NOTE — Progress Notes (Signed)
   Subjective:    Patient ID: Dylan Woods, male    DOB: 02-Nov-1945, 73 y.o.   MRN: [id number]  HPI    Review of Systems  Allergic/Immunologic: Positive for environmental allergies.  All other systems reviewed and are negative.      Objective:   Physical Exam        Assessment & Plan:

## 2018-07-23 NOTE — Progress Notes (Signed)
Subjective:   Patient ID: Dylan Woods, male   DOB: 73 y.o.   MRN: [id number]   HPI Patient presents stating that he has had lesions underneath his right over left foot and states is been very sore and makes it hard to walk.  He has had previous injections but it only lasted for around 4 weeks and the pain is quite intense over the last several months.  Patient does not remember specific injury and does not smoke and likes to be active   Review of Systems  All other systems reviewed and are negative.       Objective:  Physical Exam Vitals signs and nursing note reviewed.  Constitutional:      Appearance: He is well-developed.  Pulmonary:     Effort: Pulmonary effort is normal.  Musculoskeletal: Normal range of motion.  Skin:    General: Skin is warm.  Neurological:     Mental Status: He is alert.     Neurovascular status found to be intact muscle strength is adequate range of motion within normal limits with patient found to have inflammation fluid around the fifth MPJ right with keratotic lesion formation that is deep and painful with palpation.  Patient also has a small lesion on the left which is not as painful or sore and patient walks with a normal gait pattern     Assessment:  Inflammatory capsulitis fifth MPJ right foot with keratotic lesion that is painful along with an keratotic lesion left     Plan:  H&P conditions reviewed and I discussed the relationship to the bone that ultimately met head resection may be necessary.  Today I went ahead and did a careful steroidal injection after prepping the area with 2 mg Dexasone Kenalog 2 mg Xylocaine did sharp sterile debridement of lesion on both feet and applied bandages due to the superficial element of the skin formation.  Patient will be seen back to recheck again and may require other treatments depending on response  X-ray indicates that there is moderate enlargement head of fifth metatarsal bilateral with no other  pathology

## 2018-08-14 IMAGING — NM NM MISC PROCEDURE
2 series · 12 of 12 positions shown · non-contrast
Comparison: none

[Series 1: wbr_r-proj_st rest_(id)_sa · 6.5mm · 6.51mm/px · 6 of 64 frames shown]
[frame 6/64]
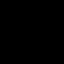
[frame 16/64]
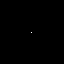
[frame 27/64]
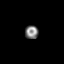
[frame 38/64]
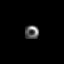
[frame 48/64]
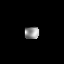
[frame 59/64]
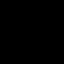

[Series 1: wbr_s-proj_st stress_(id)_sa · 6.5mm · 6.51mm/px · 6 of 512 frames shown]
[frame 43/512]
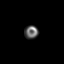
[frame 128/512]
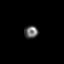
[frame 214/512]
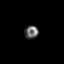
[frame 299/512]
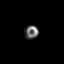
[frame 384/512]
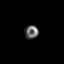
[frame 470/512]
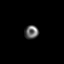

[12 of 12 positions shown; findings below may reference images not displayed]

Canned report from images found in remote index.

Refer to host system for actual result text.

## 2018-09-02 DIAGNOSIS — C61 Malignant neoplasm of prostate: Secondary | ICD-10-CM | POA: Diagnosis not present

## 2018-09-03 ENCOUNTER — Ambulatory Visit (INDEPENDENT_AMBULATORY_CARE_PROVIDER_SITE_OTHER): Payer: Medicare Other | Admitting: Podiatry

## 2018-09-03 ENCOUNTER — Encounter: Payer: Self-pay | Admitting: Podiatry

## 2018-09-03 ENCOUNTER — Other Ambulatory Visit: Payer: Self-pay

## 2018-09-03 VITALS — Temp 96.4°F

## 2018-09-03 DIAGNOSIS — M216X9 Other acquired deformities of unspecified foot: Secondary | ICD-10-CM

## 2018-09-03 DIAGNOSIS — M779 Enthesopathy, unspecified: Secondary | ICD-10-CM | POA: Diagnosis not present

## 2018-09-03 NOTE — Progress Notes (Signed)
Subjective:   Patient ID: Dylan Woods, male   DOB: 73 y.o.   MRN: [id number]   HPI Patient presents stating he is having a lot of pain in the bottom of his right foot again and states he only got relief for a couple weeks and that symptoms recur quickly at that time.  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  I reviewed both of these conditions I allowed him to read consent form going over alternative treatments complications and after review patient signed consent form understanding recovery can take 3 to 6 months.  Patient is given all preoperative instructions for surgery and all questions were answered and is encouraged to call us if any further questions, and scheduled for outpatient surgery

## 2018-09-03 NOTE — Patient Instructions (Signed)
Pre-Operative Instructions  Congratulations, you have decided to take an important step towards improving your quality of life.  You can be assured that the doctors and staff at [facility] will be with you every step of the way.  Here are some important things you should know:  1. Plan to be at the surgery center/hospital at least 1 (one) hour prior to your scheduled time, unless otherwise directed by the surgical center/hospital staff.  You must have a responsible adult accompany you, remain during the surgery and drive you home.  Make sure you have directions to the surgical center/hospital to ensure you arrive on time. 2. If you are having surgery at [facility] or [facility], you will need a copy of your medical history and physical form from your family physician within one month prior to the date of surgery. We will give you a form for your primary physician to complete.  3. We make every effort to accommodate the date you request for surgery.  However, there are times where surgery dates or times have to be moved.  We will contact you as soon as possible if a change in schedule is required.   4. No aspirin/ibuprofen for one week before surgery.  If you are on aspirin, any non-steroidal anti-inflammatory medications (Mobic, Aleve, Ibuprofen) should not be taken seven (7) days prior to your surgery.  You make take Tylenol for pain prior to surgery.  5. Medications - If you are taking daily heart and blood pressure medications, seizure, reflux, allergy, asthma, anxiety, pain or diabetes medications, make sure you notify the surgery center/hospital before the day of surgery so they can tell you which medications you should take or avoid the day of surgery. 6. No food or drink after midnight the night before surgery unless directed otherwise by surgical center/hospital staff. 7. No alcoholic beverages 24-hours prior to surgery.  No smoking 24-hours prior or 24-hours after  surgery. 8. Wear loose pants or shorts. They should be loose enough to fit over bandages, boots, and casts. 9. Don't wear slip-on shoes. Sneakers are preferred. 10. Bring your boot with you to the surgery center/hospital.  Also bring crutches or a walker if your physician has prescribed it for you.  If you do not have this equipment, it will be provided for you after surgery. 11. If you have not been contacted by the surgery center/hospital by the day before your surgery, call to confirm the date and time of your surgery. 12. Leave-time from work may vary depending on the type of surgery you have.  Appropriate arrangements should be made prior to surgery with your employer. 13. Prescriptions will be provided immediately following surgery by your doctor.  Fill these as soon as possible after surgery and take the medication as directed. Pain medications will not be refilled on weekends and must be approved by the doctor. 14. Remove nail polish on the operative foot and avoid getting pedicures prior to surgery. 15. Wash the night before surgery.  The night before surgery wash the foot and leg well with water and the antibacterial soap provided. Be sure to pay special attention to beneath the toenails and in between the toes.  Wash for at least three (3) minutes. Rinse thoroughly with water and dry well with a towel.  Perform this wash unless told not to do so by your physician.  Enclosed: 1 Ice pack (please put in freezer the night before surgery)   1 Hibiclens skin cleaner     Pre-op instructions  If you have any questions regarding the instructions, please do not hesitate to call our office.  [place]: [address], , [place] [address] -- [phone]  [place]: [address] [place], [place] [address] -- [phone]  [place]: [address]  [place], [place] [address] -- [phone]  [place]: [address], [address], [place], [place] [address] -- [phone]  Website:  [email or web address] 

## 2018-09-04 ENCOUNTER — Telehealth: Payer: Self-pay | Admitting: *Deleted

## 2018-09-04 NOTE — Telephone Encounter (Signed)
I attempted to call the patient to see if he'd like to change his appointment from September 22, 2018 to Sep 08, 2018.  I asked him to give me a call back.

## 2018-09-07 NOTE — Telephone Encounter (Signed)
"  I did get your voicemail about possibly rescheduling my procedure with Dr. Paulla Dolly.  Please give me a call back."  "You had left me a voicemail about scheduling my procedure with Dr. Paulla Dolly for sometime this week, May 19.  I have another appointment on that date.  I wouldn't be able to do it.  I'd rather leave it for June 2.  I wanted to get back to you as soon as possible about that.  Please give me a call back and confirm that you have that message."

## 2018-09-08 DIAGNOSIS — E559 Vitamin D deficiency, unspecified: Secondary | ICD-10-CM | POA: Diagnosis not present

## 2018-09-08 DIAGNOSIS — M810 Age-related osteoporosis without current pathological fracture: Secondary | ICD-10-CM | POA: Diagnosis not present

## 2018-09-09 DIAGNOSIS — N401 Enlarged prostate with lower urinary tract symptoms: Secondary | ICD-10-CM | POA: Diagnosis not present

## 2018-09-09 DIAGNOSIS — C61 Malignant neoplasm of prostate: Secondary | ICD-10-CM | POA: Diagnosis not present

## 2018-09-09 DIAGNOSIS — R351 Nocturia: Secondary | ICD-10-CM | POA: Diagnosis not present

## 2018-09-09 NOTE — Telephone Encounter (Signed)
I left him a message thanking him for returning my call.  I informed him that we still have him scheduled for September 22, 2018.

## 2018-09-22 ENCOUNTER — Encounter: Payer: Self-pay | Admitting: Podiatry

## 2018-09-22 DIAGNOSIS — M2041 Other hammer toe(s) (acquired), right foot: Secondary | ICD-10-CM | POA: Diagnosis not present

## 2018-09-22 DIAGNOSIS — G43909 Migraine, unspecified, not intractable, without status migrainosus: Secondary | ICD-10-CM | POA: Diagnosis not present

## 2018-09-22 DIAGNOSIS — M21541 Acquired clubfoot, right foot: Secondary | ICD-10-CM | POA: Diagnosis not present

## 2018-09-22 DIAGNOSIS — M21621 Bunionette of right foot: Secondary | ICD-10-CM | POA: Diagnosis not present

## 2018-09-25 ENCOUNTER — Telehealth: Payer: Self-pay | Admitting: *Deleted

## 2018-09-25 NOTE — Telephone Encounter (Signed)
Pt states he just noticed that his toes are blue.

## 2018-09-25 NOTE — Telephone Encounter (Signed)
I called pt and asked if the temperature of the blue toes is the same as the toes on the surgery foot and then also the other foot. Pt states they are the same temperature as the other toes even on the non-surgery foot. I asked pt if the toes when pressed quickly returned to color and he stated they still remained bluish. I asked if they returned to the bluish they had been previously and he stated yes. I asked pt if the toes were bluish, blackish purple and he stated yes. I explained that was the bruising from the pre-op ace wrap and tourniquet being released and pt stated understanding. Pt states he has been taking tylenol that helps better than the tramadol, but he still has some pain. I told pt that if he was able to tolerate OTC ibuprofen he could take as the package instructs in between the dosing of the tylenol. Pt states understanding.

## 2018-09-28 ENCOUNTER — Encounter: Payer: Self-pay | Admitting: Podiatry

## 2018-09-28 ENCOUNTER — Ambulatory Visit (INDEPENDENT_AMBULATORY_CARE_PROVIDER_SITE_OTHER): Payer: Medicare Other | Admitting: Podiatry

## 2018-09-28 ENCOUNTER — Other Ambulatory Visit: Payer: Self-pay

## 2018-09-28 ENCOUNTER — Ambulatory Visit (INDEPENDENT_AMBULATORY_CARE_PROVIDER_SITE_OTHER): Payer: Medicare Other

## 2018-09-28 VITALS — Temp 96.6°F

## 2018-09-28 DIAGNOSIS — M779 Enthesopathy, unspecified: Secondary | ICD-10-CM | POA: Diagnosis not present

## 2018-09-28 DIAGNOSIS — Z09 Encounter for follow-up examination after completed treatment for conditions other than malignant neoplasm: Secondary | ICD-10-CM

## 2018-09-28 DIAGNOSIS — M216X9 Other acquired deformities of unspecified foot: Secondary | ICD-10-CM

## 2018-09-28 NOTE — Progress Notes (Signed)
Subjective:   Patient ID: Dylan Woods, male   DOB: 73 y.o.   MRN: [id number]   HPI Patient states doing better with his foot with minimal discomfort and only occasionally pain if he does a lot of walking   ROS      Objective:  Physical Exam  Neurovascular status intact negative Homans sign noted wound edges well coapted right fifth metatarsal with good healing and no drainage noted     Assessment:  Doing well post fifth metatarsal head resection right     Plan:  H&P reapplied sterile dressing instructed on continued immobilization elevation and advised on continued taking it easy with this.  I went ahead today and I advised him to call if any issues should come up  X-rays indicate that there is satisfactory resection of the head of the fifth metatarsal right foot with good alignment noted

## 2018-10-01 ENCOUNTER — Telehealth: Payer: Self-pay | Admitting: *Deleted

## 2018-10-01 DIAGNOSIS — H5201 Hypermetropia, right eye: Secondary | ICD-10-CM | POA: Diagnosis not present

## 2018-10-01 DIAGNOSIS — H40013 Open angle with borderline findings, low risk, bilateral: Secondary | ICD-10-CM | POA: Diagnosis not present

## 2018-10-01 NOTE — Telephone Encounter (Signed)
Pt states he had some questions could he now go for an extended walk, and how long did he have to wear the wrap Dr.Regal put on, and his 2, 3 toes are still blue from time to time.

## 2018-10-01 NOTE — Telephone Encounter (Signed)
Left message informing pt that at this time in his recovery he should not be up on the surgery foot more than 15-20 minutes/hour, so I did not recommend the extended walk, and that he could remove the wrap to sleep and shower, and should continue to wear to train the surgery foot not to swell. I told pt that if the two toes were warm to touch, and if pressed the color returned quickly then it was probably bruising, but if they were cold, painful or the did not return to color quickly we would want to see him in office.

## 2018-10-12 ENCOUNTER — Encounter: Payer: Self-pay | Admitting: Podiatry

## 2018-10-12 ENCOUNTER — Other Ambulatory Visit: Payer: Self-pay

## 2018-10-12 ENCOUNTER — Ambulatory Visit (INDEPENDENT_AMBULATORY_CARE_PROVIDER_SITE_OTHER): Payer: Medicare Other

## 2018-10-12 ENCOUNTER — Ambulatory Visit (INDEPENDENT_AMBULATORY_CARE_PROVIDER_SITE_OTHER): Payer: Self-pay | Admitting: Podiatry

## 2018-10-12 DIAGNOSIS — Z09 Encounter for follow-up examination after completed treatment for conditions other than malignant neoplasm: Secondary | ICD-10-CM | POA: Diagnosis not present

## 2018-10-12 DIAGNOSIS — M2012 Hallux valgus (acquired), left foot: Secondary | ICD-10-CM | POA: Diagnosis not present

## 2018-10-12 DIAGNOSIS — M2011 Hallux valgus (acquired), right foot: Secondary | ICD-10-CM | POA: Diagnosis not present

## 2018-10-12 DIAGNOSIS — M216X9 Other acquired deformities of unspecified foot: Secondary | ICD-10-CM

## 2018-10-12 MED ORDER — DOXYCYCLINE HYCLATE 100 MG PO TABS: 100.0000 mg | ORAL_TABLET | Freq: Two times a day (BID) | ORAL | 0 refills | Status: DC

## 2018-10-13 NOTE — Progress Notes (Signed)
Subjective:   Patient ID: Dylan Woods, male   DOB: 73 y.o.   MRN: [id number]   HPI Patient states my foot feels okay but I was just concerned because there is been some redness and at the end of the day will hurt and I am ready to get back to activity   ROS      Objective:  Physical Exam  Neurovascular status intact with the incision site itself healing very well with no drainage and there is some redness more in the dorsum of the foot with inspection not indicating any drainage or areas of opening.  It is low-grade and not significantly painful and I did not note proximal edema erythema drainage noted     Assessment:  This does appear to be a localized process and it is difficult to tell whether it is a low-grade cellulitic process or a inflammatory process with good healing of the incision site stitches intact     Plan:  H&P condition remove stitches removed wound edges coapted well with no drainage.  As precautionary measure I placed on doxycycline twice daily and I gave strict instructions that if any changes were to occur patient is to inform us immediately and if any systemic signs of infection were to occur patient is to go straight to the emergency room  X-ray indicates that there is satisfactory section head of fifth metatarsal right with no other pathology noted

## 2018-11-05 ENCOUNTER — Telehealth: Payer: Self-pay | Admitting: *Deleted

## 2018-11-05 NOTE — Telephone Encounter (Signed)
Pt called states he had a right 5th metatarsal resection 09/22/2018 and his toe is very red, and swollen and he feels he would like to be checked by Dr. Paulla Dolly.

## 2018-11-06 ENCOUNTER — Ambulatory Visit (INDEPENDENT_AMBULATORY_CARE_PROVIDER_SITE_OTHER): Payer: Self-pay | Admitting: Podiatry

## 2018-11-06 ENCOUNTER — Other Ambulatory Visit: Payer: Self-pay

## 2018-11-06 ENCOUNTER — Other Ambulatory Visit: Payer: Self-pay | Admitting: Podiatry

## 2018-11-06 ENCOUNTER — Encounter: Payer: Self-pay | Admitting: Podiatry

## 2018-11-06 ENCOUNTER — Ambulatory Visit (INDEPENDENT_AMBULATORY_CARE_PROVIDER_SITE_OTHER): Payer: Medicare Other

## 2018-11-06 VITALS — Temp 96.9°F

## 2018-11-06 DIAGNOSIS — M216X1 Other acquired deformities of right foot: Secondary | ICD-10-CM | POA: Diagnosis not present

## 2018-11-06 DIAGNOSIS — Z09 Encounter for follow-up examination after completed treatment for conditions other than malignant neoplasm: Secondary | ICD-10-CM | POA: Diagnosis not present

## 2018-11-06 DIAGNOSIS — M216X9 Other acquired deformities of unspecified foot: Secondary | ICD-10-CM

## 2018-11-06 MED ORDER — CEPHALEXIN 500 MG PO CAPS: 500.0000 mg | ORAL_CAPSULE | Freq: Three times a day (TID) | ORAL | 1 refills | Status: DC

## 2018-11-12 NOTE — Progress Notes (Signed)
Subjective:   Patient ID: Dylan Woods, male   DOB: 73 y.o.   MRN: [id number]   HPI Patient presents stating there is been some discomfort in the right metatarsal and I just wanted to get it checked.  Patient states last night he had noted a little bit of blood and did not note any type of pus formation or other drainage and he was just concerned about his discomfort   ROS      Objective:  Physical Exam  Neurovascular status found to be intact muscle strength was adequate.  I did note the incision site right is healing well there is a small amount of abrasion to the distal portion which may have been irritation from a strap or compression and there is light redness around the area that is localized with no drainage noted or other systemic signs of infection.  Patient had no systemic fever or indications of infective process     Assessment:  Small irritated abrasion area distal right with no current indication of infection except for some light redness which may be more inflammatory or possibly low-grade localized infection process     Plan:  H&P x-ray reviewed condition discussed and I tried to educate him on this.  As a precautionary measure I placed him on cephalexin 500 mg 3 times daily and I gave instructions for monitoring and on a daily basis to any increased redness any drainage or systemic signs of infection were to occur to let us know immediately and if any other issues were to occur he is to consult with [place] and if any systemic signs of infection were to occur he is to go to the emergency room and may require IV antibiotics.  Patient will be seen back in the next 2 weeks or earlier if any issues were to occur  X-ray indicates that there is satisfactory section of bone with no ostial lysis noted

## 2018-11-27 ENCOUNTER — Ambulatory Visit (INDEPENDENT_AMBULATORY_CARE_PROVIDER_SITE_OTHER): Payer: Medicare Other

## 2018-11-27 ENCOUNTER — Encounter: Payer: Self-pay | Admitting: Podiatry

## 2018-11-27 ENCOUNTER — Other Ambulatory Visit: Payer: Self-pay

## 2018-11-27 ENCOUNTER — Ambulatory Visit (INDEPENDENT_AMBULATORY_CARE_PROVIDER_SITE_OTHER): Payer: Self-pay | Admitting: Podiatry

## 2018-11-27 VITALS — Temp 97.2°F

## 2018-11-27 DIAGNOSIS — M216X9 Other acquired deformities of unspecified foot: Secondary | ICD-10-CM

## 2018-11-27 DIAGNOSIS — M216X1 Other acquired deformities of right foot: Secondary | ICD-10-CM

## 2018-11-27 DIAGNOSIS — Z09 Encounter for follow-up examination after completed treatment for conditions other than malignant neoplasm: Secondary | ICD-10-CM

## 2018-12-02 NOTE — Progress Notes (Signed)
Subjective:   Patient ID: Dylan Woods, male   DOB: 73 y.o.   MRN: [id number]   HPI Patient states he is feeling much better with no pain no drainage and is able now to wear regular shoes and walk without discomfort with mild swelling still noted   ROS      Objective:  Physical Exam  Neurovascular status intact negative Homans sign noted wound edges well coapted no breakdown of tissue mild swelling noted but no pain with palpation to the metatarsal region     Assessment:  Doing well post fifth metatarsal head resection right     Plan:  X-ray reviewed discussed continued compression elevation as needed and patient is at this point discharged and is allowed to return to normal care but will return if any issues should occur  X-ray indicates that the fifth metatarsal head resection right is satisfactory with good alignment noted no pathology

## 2018-12-16 DIAGNOSIS — Z79899 Other long term (current) drug therapy: Secondary | ICD-10-CM | POA: Diagnosis not present

## 2018-12-16 DIAGNOSIS — Z23 Encounter for immunization: Secondary | ICD-10-CM | POA: Diagnosis not present

## 2018-12-16 DIAGNOSIS — D6489 Other specified anemias: Secondary | ICD-10-CM | POA: Diagnosis not present

## 2018-12-16 DIAGNOSIS — M810 Age-related osteoporosis without current pathological fracture: Secondary | ICD-10-CM | POA: Diagnosis not present

## 2018-12-17 DIAGNOSIS — R82998 Other abnormal findings in urine: Secondary | ICD-10-CM | POA: Diagnosis not present

## 2018-12-22 DIAGNOSIS — Z1331 Encounter for screening for depression: Secondary | ICD-10-CM | POA: Diagnosis not present

## 2018-12-22 DIAGNOSIS — R002 Palpitations: Secondary | ICD-10-CM | POA: Diagnosis not present

## 2018-12-22 DIAGNOSIS — R490 Dysphonia: Secondary | ICD-10-CM | POA: Diagnosis not present

## 2018-12-22 DIAGNOSIS — Z0000 Encounter for general adult medical examination without abnormal findings: Secondary | ICD-10-CM | POA: Diagnosis not present

## 2018-12-22 DIAGNOSIS — C61 Malignant neoplasm of prostate: Secondary | ICD-10-CM | POA: Diagnosis not present

## 2018-12-22 DIAGNOSIS — R05 Cough: Secondary | ICD-10-CM | POA: Diagnosis not present

## 2018-12-22 DIAGNOSIS — N3281 Overactive bladder: Secondary | ICD-10-CM | POA: Diagnosis not present

## 2018-12-22 DIAGNOSIS — M810 Age-related osteoporosis without current pathological fracture: Secondary | ICD-10-CM | POA: Diagnosis not present

## 2018-12-22 DIAGNOSIS — Z1339 Encounter for screening examination for other mental health and behavioral disorders: Secondary | ICD-10-CM | POA: Diagnosis not present

## 2018-12-22 DIAGNOSIS — F419 Anxiety disorder, unspecified: Secondary | ICD-10-CM | POA: Diagnosis not present

## 2018-12-22 DIAGNOSIS — D649 Anemia, unspecified: Secondary | ICD-10-CM | POA: Diagnosis not present

## 2018-12-22 DIAGNOSIS — M79676 Pain in unspecified toe(s): Secondary | ICD-10-CM | POA: Diagnosis not present

## 2018-12-22 DIAGNOSIS — M545 Low back pain: Secondary | ICD-10-CM | POA: Diagnosis not present

## 2018-12-30 DIAGNOSIS — R29890 Loss of height: Secondary | ICD-10-CM | POA: Diagnosis not present

## 2018-12-30 DIAGNOSIS — R635 Abnormal weight gain: Secondary | ICD-10-CM | POA: Diagnosis not present

## 2018-12-30 DIAGNOSIS — Z8546 Personal history of malignant neoplasm of prostate: Secondary | ICD-10-CM | POA: Diagnosis not present

## 2018-12-30 DIAGNOSIS — M8589 Other specified disorders of bone density and structure, multiple sites: Secondary | ICD-10-CM | POA: Diagnosis not present

## 2018-12-30 DIAGNOSIS — M4856XA Collapsed vertebra, not elsewhere classified, lumbar region, initial encounter for fracture: Secondary | ICD-10-CM | POA: Diagnosis not present

## 2019-01-10 IMAGING — CR DG CHEST 2V
2 series · 2 of 2 positions shown · non-contrast
Comparison: None.

CLINICAL DATA: Prostate carcinoma.  Pre-op respiratory exam

EXAM:
CHEST  2 VIEW

[w chest pa]
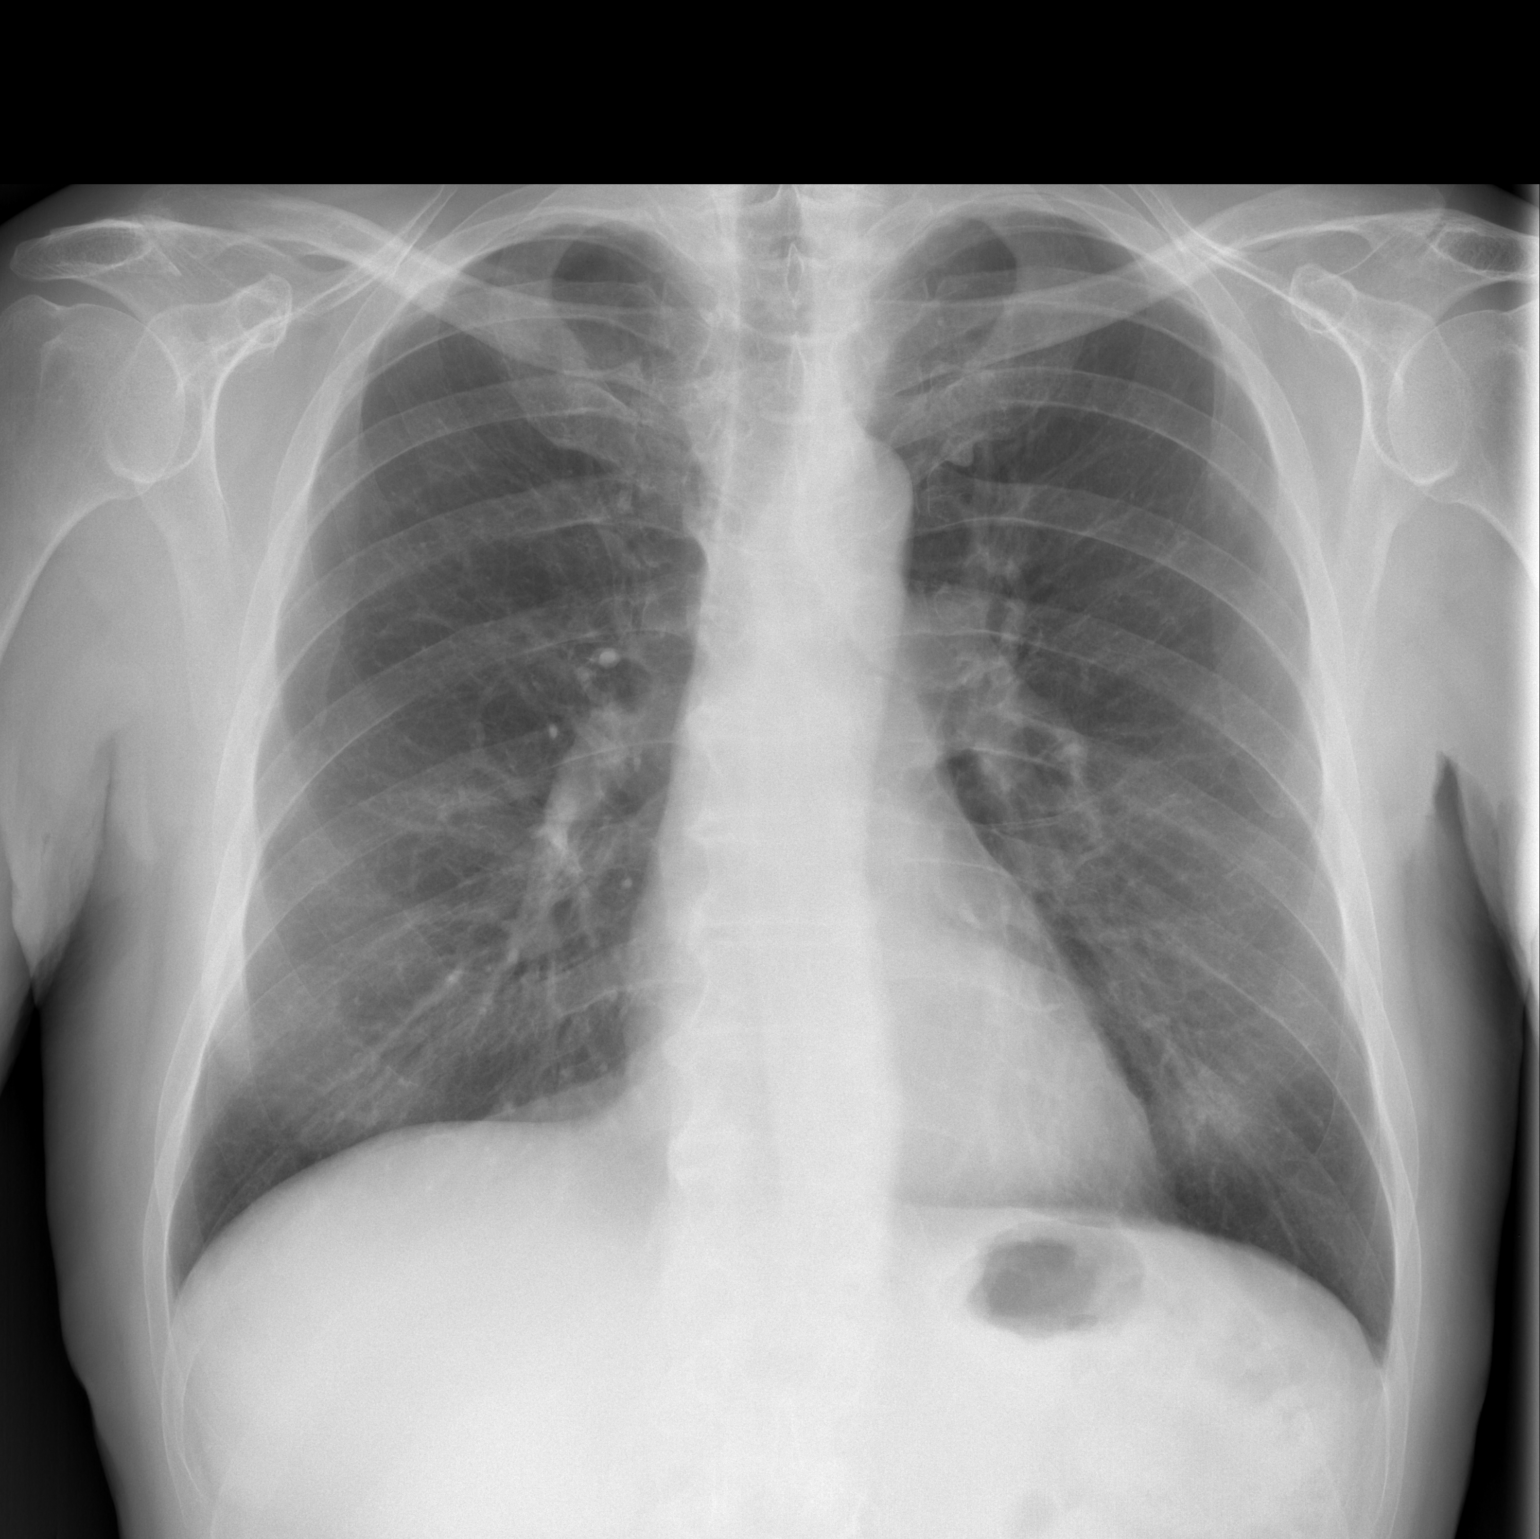

[w chest lat]
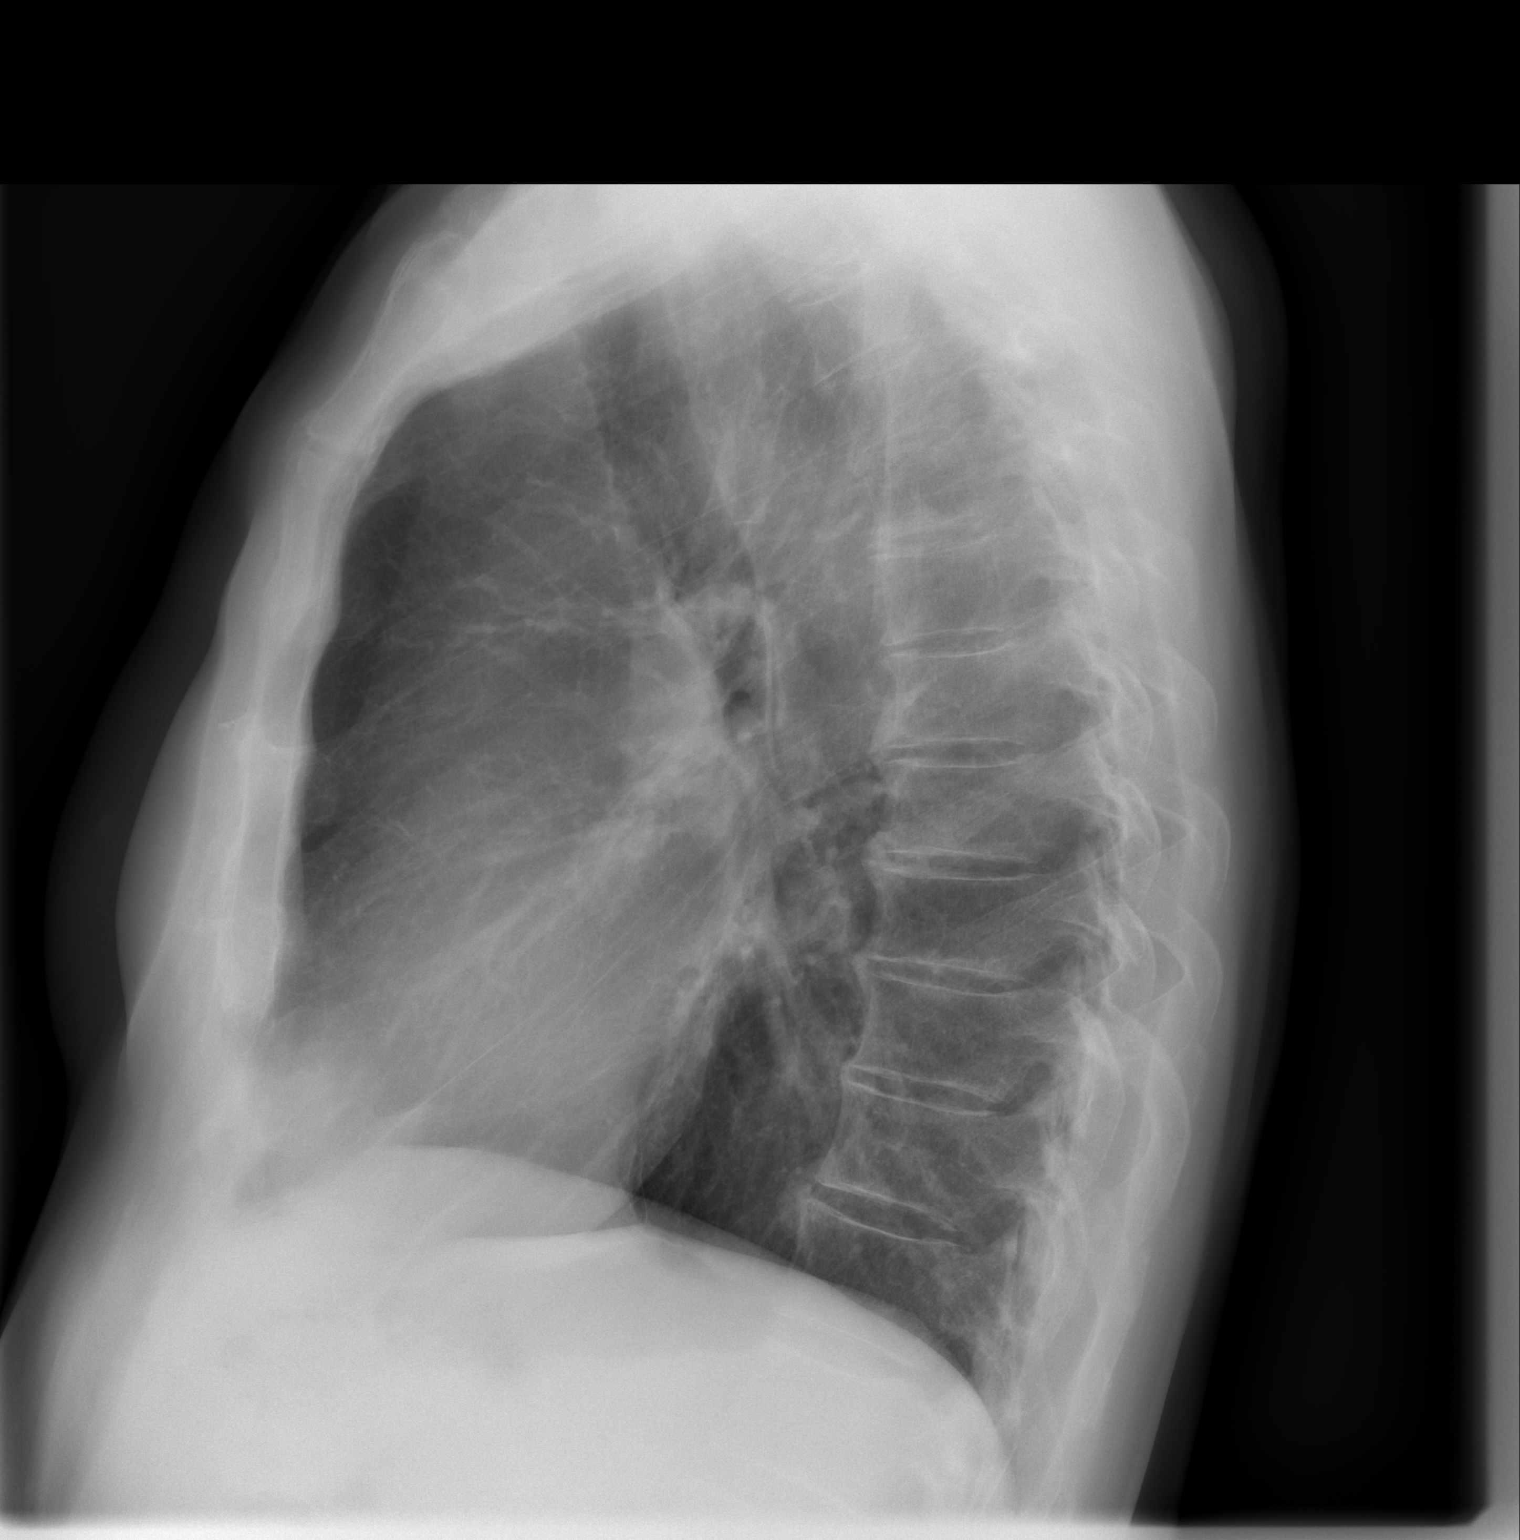

[2 of 2 positions shown; findings below may reference images not displayed]

FINDINGS: The heart size and mediastinal contours are within normal limits.
Both lungs are clear. The visualized skeletal structures are
unremarkable.
IMPRESSION: No active cardiopulmonary disease.

## 2019-01-13 DIAGNOSIS — Z1212 Encounter for screening for malignant neoplasm of rectum: Secondary | ICD-10-CM | POA: Diagnosis not present

## 2019-01-21 DIAGNOSIS — C61 Malignant neoplasm of prostate: Secondary | ICD-10-CM | POA: Diagnosis not present

## 2019-01-21 DIAGNOSIS — N3941 Urge incontinence: Secondary | ICD-10-CM | POA: Diagnosis not present

## 2019-01-21 DIAGNOSIS — R3915 Urgency of urination: Secondary | ICD-10-CM | POA: Diagnosis not present

## 2019-01-21 DIAGNOSIS — R350 Frequency of micturition: Secondary | ICD-10-CM | POA: Diagnosis not present

## 2019-01-21 DIAGNOSIS — R351 Nocturia: Secondary | ICD-10-CM | POA: Diagnosis not present

## 2019-01-26 DIAGNOSIS — M810 Age-related osteoporosis without current pathological fracture: Secondary | ICD-10-CM | POA: Diagnosis not present

## 2019-01-26 DIAGNOSIS — R5383 Other fatigue: Secondary | ICD-10-CM | POA: Diagnosis not present

## 2019-01-26 DIAGNOSIS — E559 Vitamin D deficiency, unspecified: Secondary | ICD-10-CM | POA: Diagnosis not present

## 2019-03-11 ENCOUNTER — Other Ambulatory Visit: Payer: Self-pay

## 2019-03-11 DIAGNOSIS — Z20822 Contact with and (suspected) exposure to covid-19: Secondary | ICD-10-CM

## 2019-03-15 LAB — NOVEL CORONAVIRUS, NAA: SARS-CoV-2, NAA: NOT DETECTED

## 2019-04-06 DIAGNOSIS — H40013 Open angle with borderline findings, low risk, bilateral: Secondary | ICD-10-CM | POA: Diagnosis not present

## 2019-04-23 HISTORY — PX: BLADDER STONE REMOVAL: SHX568

## 2019-05-06 DIAGNOSIS — D044 Carcinoma in situ of skin of scalp and neck: Secondary | ICD-10-CM | POA: Diagnosis not present

## 2019-05-06 DIAGNOSIS — L821 Other seborrheic keratosis: Secondary | ICD-10-CM | POA: Diagnosis not present

## 2019-05-06 DIAGNOSIS — L570 Actinic keratosis: Secondary | ICD-10-CM | POA: Diagnosis not present

## 2019-05-06 DIAGNOSIS — D485 Neoplasm of uncertain behavior of skin: Secondary | ICD-10-CM | POA: Diagnosis not present

## 2019-05-06 DIAGNOSIS — L304 Erythema intertrigo: Secondary | ICD-10-CM | POA: Diagnosis not present

## 2019-05-06 DIAGNOSIS — Z85828 Personal history of other malignant neoplasm of skin: Secondary | ICD-10-CM | POA: Diagnosis not present

## 2019-05-06 DIAGNOSIS — D225 Melanocytic nevi of trunk: Secondary | ICD-10-CM | POA: Diagnosis not present

## 2019-05-13 ENCOUNTER — Ambulatory Visit: Payer: Medicare Other | Attending: Internal Medicine

## 2019-05-13 DIAGNOSIS — Z23 Encounter for immunization: Secondary | ICD-10-CM | POA: Insufficient documentation

## 2019-05-13 NOTE — Progress Notes (Signed)
   Covid-19 Vaccination Clinic  Name:  Dylan Woods    MRN: [id number] DOB: 30-Dec-1945  05/13/2019  [name] was observed post Covid-19 immunization for 15 minutes without incidence. He was provided with Vaccine Information Sheet and instruction to access the V-Safe system.   [name] was instructed to call 911 with any severe reactions post vaccine: Marland Kitchen Difficulty breathing  . Swelling of your face and throat  . A fast heartbeat  . A bad rash all over your body  . Dizziness and weakness    Immunizations Administered    Name Date Dose VIS Date Route   Pfizer COVID-19 Vaccine 05/13/2019  1:05 PM 0.3 mL 04/02/2019 Intramuscular   Manufacturer: [place]   Lot: [id number]   [place]: [id number]

## 2019-05-18 DIAGNOSIS — C61 Malignant neoplasm of prostate: Secondary | ICD-10-CM | POA: Diagnosis not present

## 2019-05-24 DIAGNOSIS — R350 Frequency of micturition: Secondary | ICD-10-CM | POA: Diagnosis not present

## 2019-05-24 DIAGNOSIS — N401 Enlarged prostate with lower urinary tract symptoms: Secondary | ICD-10-CM | POA: Diagnosis not present

## 2019-05-24 DIAGNOSIS — C61 Malignant neoplasm of prostate: Secondary | ICD-10-CM | POA: Diagnosis not present

## 2019-05-28 ENCOUNTER — Ambulatory Visit: Payer: Medicare Other

## 2019-06-03 ENCOUNTER — Ambulatory Visit: Payer: Medicare Other | Attending: Internal Medicine

## 2019-06-03 DIAGNOSIS — Z23 Encounter for immunization: Secondary | ICD-10-CM | POA: Insufficient documentation

## 2019-06-03 DIAGNOSIS — R350 Frequency of micturition: Secondary | ICD-10-CM | POA: Diagnosis not present

## 2019-06-03 DIAGNOSIS — N3941 Urge incontinence: Secondary | ICD-10-CM | POA: Diagnosis not present

## 2019-06-03 NOTE — Progress Notes (Signed)
   Covid-19 Vaccination Clinic  Name:  Dylan Woods    MRN: [id number] DOB: 1945-09-09  06/03/2019  [name] was observed post Covid-19 immunization for 15 minutes without incidence. He was provided with Vaccine Information Sheet and instruction to access the V-Safe system.   [name] was instructed to call 911 with any severe reactions post vaccine: Marland Kitchen Difficulty breathing  . Swelling of your face and throat  . A fast heartbeat  . A bad rash all over your body  . Dizziness and weakness    Immunizations Administered    Name Date Dose VIS Date Route   Pfizer COVID-19 Vaccine 06/03/2019  2:30 PM 0.3 mL 04/02/2019 Intramuscular   Manufacturer: [place]   Lot: [id number]   [place]: [id number]

## 2019-06-11 DIAGNOSIS — N3941 Urge incontinence: Secondary | ICD-10-CM | POA: Diagnosis not present

## 2019-06-11 DIAGNOSIS — R350 Frequency of micturition: Secondary | ICD-10-CM | POA: Diagnosis not present

## 2019-06-15 DIAGNOSIS — R350 Frequency of micturition: Secondary | ICD-10-CM | POA: Diagnosis not present

## 2019-06-15 DIAGNOSIS — N3941 Urge incontinence: Secondary | ICD-10-CM | POA: Diagnosis not present

## 2019-06-24 DIAGNOSIS — N3941 Urge incontinence: Secondary | ICD-10-CM | POA: Diagnosis not present

## 2019-07-01 DIAGNOSIS — N3941 Urge incontinence: Secondary | ICD-10-CM | POA: Diagnosis not present

## 2019-07-06 DIAGNOSIS — M810 Age-related osteoporosis without current pathological fracture: Secondary | ICD-10-CM | POA: Diagnosis not present

## 2019-07-08 DIAGNOSIS — N3941 Urge incontinence: Secondary | ICD-10-CM | POA: Diagnosis not present

## 2019-07-15 DIAGNOSIS — N3941 Urge incontinence: Secondary | ICD-10-CM | POA: Diagnosis not present

## 2019-07-19 ENCOUNTER — Other Ambulatory Visit: Payer: Self-pay

## 2019-07-19 ENCOUNTER — Encounter: Payer: Self-pay | Admitting: Neurology

## 2019-07-19 ENCOUNTER — Ambulatory Visit: Payer: PPO | Admitting: Neurology

## 2019-07-19 VITALS — BP 115/69 | HR 52 | Temp 97.1°F | Ht 73.0 in | Wt 185.0 lb

## 2019-07-19 DIAGNOSIS — G43019 Migraine without aura, intractable, without status migrainosus: Secondary | ICD-10-CM

## 2019-07-19 DIAGNOSIS — R413 Other amnesia: Secondary | ICD-10-CM | POA: Insufficient documentation

## 2019-07-19 MED ORDER — TOPIRAMATE 25 MG PO TABS: 75.0000 mg | ORAL_TABLET | Freq: Every day | ORAL | 3 refills | Status: DC

## 2019-07-19 NOTE — Progress Notes (Signed)
PATIENT: Dylan Woods DOB: 10-03-1945  REASON FOR VISIT: follow up HISTORY FROM: patient  HISTORY OF PRESENT ILLNESS: Today 07/19/19  Dylan Woods is a 74 year old male with history of migraine headache.  His headaches are well controlled with Topamax. Off Topamax, his headaches are quite frequent and severe. He is taking Topamax 75 mg at bedtime. He is pleased with it.  Treatment for prostate cancer.  His PSA is now undetectable.  He has some residual bladder issues, is currently in nerve stimulation treatment. He may have 1 headache every 2 months, will take Tylenol #3 if significant.  He rarely has a severe migraine, not even once a year.  Today, he mentions a new problem, concern for memory loss.  He feels for the last year, he has been more forgetful, denies family history.  He does not have good recall.  He is widowed.  He lives alone, manages his own affairs.  He has difficulty with short-term and long-term memory. He recalls looking at photos of trips from the past and not able to remember them.  He continues to function well on his own in regards to driving and managing his finances.  He also reports he feels his balance is not as good.  He may stumble from time to time.  He presents today for evaluation unaccompanied.  HISTORY 07/16/2018 Dr. [name]: Dylan Woods is a 74 year old right-handed white male with a history of migraine headache.  The patient has a history of prostate cancer, he is on Lupron shots for this.  He does not believe that the Lupron has any impact on his migraine.  He is on Fosamax secondary to osteoporosis associated with the Lupron treatments, this medication will give him malaise for about 24 hours and he will have some headache with this.  On the Topamax, he has been well controlled otherwise with his headaches.  He occasionally have some very mild discomfort in the right head.  Off of the Topamax, his migraines become much more frequent and more severe.  He is  currently on 75 mg of Topamax at night, he tolerates the drug well.  He does have some sleeping problems due to urinary frequency.  He is on DDAVP at night to help reduce his urinary frequency.   REVIEW OF SYSTEMS: Out of a complete 14 system review of symptoms, the patient complains only of the following symptoms, and all other reviewed systems are negative.  Headache, memory loss  ALLERGIES: Allergies  Allergen Reactions  . Ampicillin Rash    Has patient had a PCN reaction causing immediate rash, facial/tongue/throat swelling, SOB or lightheadedness with hypotension: Yes Has patient had a PCN reaction causing severe rash involving mucus membranes or skin necrosis: No Has patient had a PCN reaction that required hospitalization: No Has patient had a PCN reaction occurring within the last 10 years: No If all of the above answers are "NO", then may proceed with Cephalosporin use.   Marland Kitchen Ketorolac Other (See Comments)    "Makes him feel like he is crazy"  . Betadine [Povidone Iodine] Other (See Comments)    " irritates skin, burns and stings"    HOME MEDICATIONS: Outpatient Medications Prior to Visit  Medication Sig Dispense Refill  . acetaminophen (TYLENOL) 500 MG tablet Take 500 mg by mouth every 6 (six) hours as needed.    Marland Kitchen acetaminophen-codeine (TYLENOL #3) 300-30 MG tablet Take 1 tablet by mouth daily as needed.    Marland Kitchen acyclovir (ZOVIRAX) 200 MG capsule  Take 200 mg by mouth as needed.     Marland Kitchen alendronate (FOSAMAX) 70 MG tablet 1 tablet once a week.    . ALPRAZolam (XANAX) 0.25 MG tablet Take 0.25 mg by mouth as needed for sleep (Taking when going on air flights).     . Ascorbic Acid (SM CHEWABLE VITAMIN C) 500 MG CHEW Chew 1,000 mg by mouth daily.    Marland Kitchen CALCIUM-VITAMIN D PO Take 1 tablet by mouth 2 (two) times daily. 600-400mg  tablet     . desmopressin (DDAVP) 0.2 MG tablet 0.6 mg every evening.     . Doxylamine Succinate, Sleep, (UNISOM PO) Take by mouth. Take on tablet night for sleep     . Ferrous Sulfate (IRON SUPPLEMENT PO) Take 20 mg by mouth 2 (two) times daily.    . Multiple Vitamin (MULTI-VITAMINS) TABS Take 1 tablet by mouth daily. Centrum Silver for Men 50+    . traMADol (ULTRAM) 50 MG tablet Take 1-2 tablets (50-100 mg total) by mouth every 6 (six) hours as needed for moderate pain or severe pain. Post-operatively 20 tablet 0  . valACYclovir (VALTREX) 1000 MG tablet Take 1,000 mg by mouth daily as needed (for cold sores).     . topiramate (TOPAMAX) 25 MG tablet Take 3 tablets (75 mg total) by mouth at bedtime. 270 tablet 3  . ketoconazole (NIZORAL) 2 % cream APPLY TO THE AFFECTED AREA TWICE A DAY    . mirabegron ER (MYRBETRIQ) 25 MG TB24 tablet Take 50 mg by mouth daily.     Marland Kitchen OVER THE COUNTER MEDICATION FLORAVITAL- IRON- HERBS SUPPLEMENT LIQUID---  Takes 26ml twice dialy     No facility-administered medications prior to visit.    PAST MEDICAL HISTORY: Past Medical History:  Diagnosis Date  . Anemia   . Bladder outlet obstruction   . Degenerative arthritis   . Diverticulosis   . Foley catheter in place   . Heart murmur   . History of chest pain    per pt and epic documentation -- cardiac work-up done --- per dr Marlou Porch note felt to not be cardiac related  . History of radiation therapy    boost w/ radioactive prostate seed implants, 110Gy; then IMRT  45Gy in 25 fractions , 01-20-2017 to 02-24-2017  . History of urinary retention 12/23/2016   post surgery 12-18-2016  . Hyperplasia of prostate with lower urinary tract symptoms (LUTS)   . Intermittent palpitations    mostly at night--  cardiologist visit w/ dr Marlou Porch 08-07-2017 in epic, ekg showed PACs  . Internal hemorrhoids   . Irritable bowel syndrome with diarrhea   . Migraines    neurologist-  dr [name]  . Osteoporosis   . Prostate cancer [facility]) urologist-  dr dahlstedt/  oncologist-  dr [name]   dx 03-29-2016-- Stage T1c, Gleason 4+5, PSA 5.06, vol 74.51cc--- Hormone therapy (lupron injection)  prostate decreased to 47cc; 12-18-2016  radiactive seed implants;  completed external beam radiation 02-24-2017 PTNS injections.     PAST SURGICAL HISTORY: Past Surgical History:  Procedure Laterality Date  . CARDIOVASCULAR STRESS TEST  06-19-2016   dr Marlou Porch   Intermediate risk nuclear study w/ no reversible ischemia with moderate size and intensity fixed inferoseptal and septal perfusion defect, consider artifact scar/  LVEF nuclear stress 48% (45-54%)  with septal hypokinesis to dyskinesis  . CATARACT EXTRACTION W/ INTRAOCULAR LENS  IMPLANT, BILATERAL  2011;  2004  . CIRCUMCISION  age 2  . CYSTOSCOPY N/A 12/18/2016   Procedure: CYSTOSCOPY  FLEXIBLE;  Surgeon: [name], MD;  Location: [facility];  Service: Urology;  Laterality: N/A;  NO SEEDS FOUND IN BLADDER  . CYSTOSCOPY WITH INSERTION OF UROLIFT  03/ 2019   dr [name]  w/ anesthesia @ [facility]  . GOLD SEED IMPLANT N/A 12/18/2016   Procedure: GOLD SEED IMPLANT;  Surgeon: [name], MD;  Location: [facility];  Service: Urology;  Laterality: N/A;   3 MARKERS  IMPLANTED  . INGUINAL HERNIA REPAIR Right 1992  . KNEE ARTHROSCOPY Left 2003;  2008  . NASAL SINUS SURGERY  2015  . PROSTATE BIOPSY  03-29-2016   dr Pilar Jarvis (office)  . RADIOACTIVE SEED IMPLANT N/A 12/18/2016   Procedure: RADIOACTIVE SEED IMPLANT/BRACHYTHERAPY IMPLANT;  Surgeon: [name], MD;  Location: [facility];  Service: Urology;  Laterality: N/A;    62  SEEDS IMPLANTED  . rt foot metatarsal Right   . SPACE OAR INSTILLATION N/A 12/18/2016   Procedure: SPACE OAR INSTILLATION;  Surgeon: [name], MD;  Location: [facility];  Service: Urology;  Laterality: N/A;  . TRANSURETHRAL RESECTION OF PROSTATE N/A 10/31/2017   Procedure: TRANSURETHRAL RESECTION OF THE PROSTATE (TURP);  Surgeon: [name], MD;  Location: [facility];  Service: Urology;   Laterality: N/A;    FAMILY HISTORY: Family History  Problem Relation Age of Onset  . Parkinsonism Father   . Colon polyps Father   . Cancer Mother        lymphoma  . Colon polyps Mother   . Cancer Brother        prostate ca s/p prostatectomy    SOCIAL HISTORY: Social History   Socioeconomic History  . Marital status: Widowed    Spouse name: Not on file  . Number of children: 1  . Years of education: college  . 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 Smoking status: Former Smoker    Years: 6.00    Types: Cigarettes    Quit date: 12/10/1969    Years since quitting: 49.6  . Smokeless tobacco: Never Used  Substance and Sexual Activity  . Alcohol use: Not Currently  . Drug use: No  . Sexual activity: Not Currently  Other Topics Concern  . Not on file  Social History Narrative   Patient lives at home alone widowed.   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   Right handed.   College education.   Caffeine one cup daily coffee.   Social Determinants of Health   Financial Resource Strain:   . Difficulty of Paying Living Expenses:   Food Insecurity:   . Worried About Charity fundraiser in the Last Year:   . Arboriculturist in the Last Year:   Transportation Needs:   . Film/video editor (Medical):   Marland Kitchen Lack of Transportation (Non-Medical):   Physical Activity:   . Days of Exercise per Week:   . Minutes of Exercise per Session:   Stress:   . Feeling of Stress :   Social Connections:   . Frequency of Communication with Friends and Family:   . Frequency of Social Gatherings with Friends and Family:   . Attends Religious Services:   . Active Member of Clubs or Organizations:   . Attends Archivist Meetings:   .  Marital Status:   Intimate Partner Violence:   . Fear of Current or Ex-Partner:   . Emotionally Abused:   Marland Kitchen  Physically Abused:   . Sexually Abused:    PHYSICAL EXAM  Vitals:   07/19/19 1428  BP: 115/69  Pulse: (!) 52  Temp: (!) 97.1 F (36.2 C)  Weight: 185 lb (83.9 kg)  Height: 6\' 1"  (1.854 m)   Body mass index is 24.41 kg/m.  Generalized: Well developed, in no acute distress   Neurological examination  Mentation: Alert oriented to time, place, history taking. Follows all commands speech and language fluent, MMSE 30/30 Cranial nerve II-XII: Pupils were equal round reactive to light. Extraocular movements were full, visual field were full on confrontational test. Facial sensation and strength were normal.  Head turning and shoulder shrug  were normal and symmetric. Motor: The motor testing reveals 5 over 5 strength of all 4 extremities. Good symmetric motor tone is noted throughout.  Sensory: Sensory testing is intact to soft touch on all 4 extremities. No evidence of extinction is noted.  Coordination: Cerebellar testing reveals good finger-nose-finger and heel-to-shin bilaterally.  Gait and station: Gait is normal. Tandem gait is slightly unsteady. Romberg is negative. No drift is seen.  Reflexes: Deep tendon reflexes are symmetric and normal bilaterally.   DIAGNOSTIC DATA (LABS, IMAGING, TESTING) - I reviewed patient records, labs, notes, testing and imaging myself where available.  Lab Results  Component Value Date   WBC 6.4 12/11/2016   HGB 12.5 (L) 10/31/2017   HCT 42.0 12/18/2016   MCV 93.3 12/11/2016   PLT 377 12/11/2016      Component Value Date/Time   NA 142 08/08/2017 1701   K 3.7 08/08/2017 1701   CL 109 08/08/2017 1701   CO2 24 08/08/2017 1701   GLUCOSE 117 (H) 08/08/2017 1701   BUN 22 (H) 08/08/2017 1701   CREATININE 0.68 08/08/2017 1701   CALCIUM 9.5 08/08/2017 1701   PROT 6.9 12/11/2016 1400   ALBUMIN 4.0 12/11/2016 1400   AST 22 12/11/2016 1400   ALT 20 12/11/2016 1400   ALKPHOS 65 12/11/2016 1400   BILITOT 0.5 12/11/2016 1400   GFRNONAA >60  08/08/2017 1701   GFRAA >60 08/08/2017 1701   No results found for: CHOL, HDL, LDLCALC, LDLDIRECT, TRIG, CHOLHDL No results found for: HGBA1C No results found for: VITAMINB12 Lab Results  Component Value Date   TSH 2.34 01/21/2018    ASSESSMENT AND PLAN 74 y.o. year old male  has a past medical history of Anemia, Bladder outlet obstruction, Degenerative arthritis, Diverticulosis, Foley catheter in place, Heart murmur, History of chest pain, History of radiation therapy, History of urinary retention (12/23/2016), Hyperplasia of prostate with lower urinary tract symptoms (LUTS), Intermittent palpitations, Internal hemorrhoids, Irritable bowel syndrome with diarrhea, Migraines, Osteoporosis, and Prostate cancer [facility]) (urologist-  dr dahlstedt/  oncologist-  dr [name]). here with:  1.  Migraine headache 2.  Reported memory loss  He will remain on Topamax 75 mg at bedtime, as his headaches are under excellent control.  In regards to his report of memory loss, he describes this as starting over the last year.  I will order blood work today to check for reversible causes (TSH, B12, RPR, sed rate).  I suggested MRI of the brain or even CT scan, he wishes to hold off on this for now.  He will follow-up in 6 months to see Dr. [name], for check on the memory issues, as this is new. MMSE was 30/30  today. He was previously treated with Lupron for prostate cancer. He has been on Topamax for several years, without side effect of memory loss.   I spent 30 minutes of face-to-face and non-face-to-face time with patient.  This included previsit chart review, lab review, study review, order entry, electronic health record documentation, patient education.  Butler Denmark, AGNP-C, DNP 07/19/2019, 3:21 PM [facility] [address], [place], [place] [address] [phone]

## 2019-07-19 NOTE — Progress Notes (Signed)
I have read the note, and I agree with the clinical assessment and plan.  Kaleem Sartwell K Yohann Curl   

## 2019-07-19 NOTE — Patient Instructions (Addendum)
Check blood work today for reversible causes of memory loss  Continue taking Topamax See you back in 6 months

## 2019-07-20 ENCOUNTER — Telehealth: Payer: Self-pay | Admitting: *Deleted

## 2019-07-20 LAB — VITAMIN B12: Vitamin B-12: 991 pg/mL (ref 232–1245)

## 2019-07-20 LAB — TSH: TSH: 2.72 u[IU]/mL (ref 0.450–4.500)

## 2019-07-20 LAB — RPR: RPR Ser Ql: NONREACTIVE

## 2019-07-20 LAB — SEDIMENTATION RATE: Sed Rate: 4 mm/hr (ref 0–30)

## 2019-07-20 NOTE — Telephone Encounter (Signed)
I called pt and relayed normal results.  He verbalized understanding.

## 2019-07-20 NOTE — Telephone Encounter (Signed)
-----   Message from Suzzanne Cloud, NP sent at 07/20/2019  6:03 AM EDT ----- Labs are all within normal range. Nothing to explain report for memory trouble.

## 2019-07-22 DIAGNOSIS — R350 Frequency of micturition: Secondary | ICD-10-CM | POA: Diagnosis not present

## 2019-07-22 DIAGNOSIS — N3941 Urge incontinence: Secondary | ICD-10-CM | POA: Diagnosis not present

## 2019-07-29 DIAGNOSIS — N3941 Urge incontinence: Secondary | ICD-10-CM | POA: Diagnosis not present

## 2019-08-05 DIAGNOSIS — N3941 Urge incontinence: Secondary | ICD-10-CM | POA: Diagnosis not present

## 2019-08-05 DIAGNOSIS — R350 Frequency of micturition: Secondary | ICD-10-CM | POA: Diagnosis not present

## 2019-08-10 DIAGNOSIS — N3941 Urge incontinence: Secondary | ICD-10-CM | POA: Diagnosis not present

## 2019-08-10 DIAGNOSIS — R350 Frequency of micturition: Secondary | ICD-10-CM | POA: Diagnosis not present

## 2019-08-19 DIAGNOSIS — N3941 Urge incontinence: Secondary | ICD-10-CM | POA: Diagnosis not present

## 2019-08-19 DIAGNOSIS — R350 Frequency of micturition: Secondary | ICD-10-CM | POA: Diagnosis not present

## 2019-09-02 DIAGNOSIS — N3941 Urge incontinence: Secondary | ICD-10-CM | POA: Diagnosis not present

## 2019-09-02 DIAGNOSIS — R350 Frequency of micturition: Secondary | ICD-10-CM | POA: Diagnosis not present

## 2019-09-10 DIAGNOSIS — R32 Unspecified urinary incontinence: Secondary | ICD-10-CM | POA: Diagnosis not present

## 2019-09-15 DIAGNOSIS — R32 Unspecified urinary incontinence: Secondary | ICD-10-CM | POA: Diagnosis not present

## 2019-09-16 DIAGNOSIS — N3941 Urge incontinence: Secondary | ICD-10-CM | POA: Diagnosis not present

## 2019-09-16 DIAGNOSIS — R350 Frequency of micturition: Secondary | ICD-10-CM | POA: Diagnosis not present

## 2019-09-28 DIAGNOSIS — C61 Malignant neoplasm of prostate: Secondary | ICD-10-CM | POA: Diagnosis not present

## 2019-10-04 DIAGNOSIS — C61 Malignant neoplasm of prostate: Secondary | ICD-10-CM | POA: Diagnosis not present

## 2019-10-04 DIAGNOSIS — N401 Enlarged prostate with lower urinary tract symptoms: Secondary | ICD-10-CM | POA: Diagnosis not present

## 2019-10-04 DIAGNOSIS — R350 Frequency of micturition: Secondary | ICD-10-CM | POA: Diagnosis not present

## 2019-10-04 DIAGNOSIS — N5201 Erectile dysfunction due to arterial insufficiency: Secondary | ICD-10-CM | POA: Diagnosis not present

## 2019-10-08 DIAGNOSIS — Z85828 Personal history of other malignant neoplasm of skin: Secondary | ICD-10-CM | POA: Diagnosis not present

## 2019-10-08 DIAGNOSIS — Z1889 Other specified retained foreign body fragments: Secondary | ICD-10-CM | POA: Diagnosis not present

## 2019-10-08 DIAGNOSIS — D045 Carcinoma in situ of skin of trunk: Secondary | ICD-10-CM | POA: Diagnosis not present

## 2019-10-13 DIAGNOSIS — R32 Unspecified urinary incontinence: Secondary | ICD-10-CM | POA: Diagnosis not present

## 2019-10-19 DIAGNOSIS — H40013 Open angle with borderline findings, low risk, bilateral: Secondary | ICD-10-CM | POA: Diagnosis not present

## 2019-10-19 DIAGNOSIS — H2102 Hyphema, left eye: Secondary | ICD-10-CM | POA: Diagnosis not present

## 2019-10-19 DIAGNOSIS — H5203 Hypermetropia, bilateral: Secondary | ICD-10-CM | POA: Diagnosis not present

## 2019-11-15 DIAGNOSIS — R32 Unspecified urinary incontinence: Secondary | ICD-10-CM | POA: Diagnosis not present

## 2019-11-30 DIAGNOSIS — H4042X0 Glaucoma secondary to eye inflammation, left eye, stage unspecified: Secondary | ICD-10-CM | POA: Diagnosis not present

## 2019-12-02 DIAGNOSIS — H4042X0 Glaucoma secondary to eye inflammation, left eye, stage unspecified: Secondary | ICD-10-CM | POA: Diagnosis not present

## 2019-12-06 DIAGNOSIS — H4042X0 Glaucoma secondary to eye inflammation, left eye, stage unspecified: Secondary | ICD-10-CM | POA: Diagnosis not present

## 2019-12-06 DIAGNOSIS — Z961 Presence of intraocular lens: Secondary | ICD-10-CM | POA: Diagnosis not present

## 2019-12-14 DIAGNOSIS — H40013 Open angle with borderline findings, low risk, bilateral: Secondary | ICD-10-CM | POA: Diagnosis not present

## 2019-12-16 DIAGNOSIS — M810 Age-related osteoporosis without current pathological fracture: Secondary | ICD-10-CM | POA: Diagnosis not present

## 2019-12-16 DIAGNOSIS — Z79818 Long term (current) use of other agents affecting estrogen receptors and estrogen levels: Secondary | ICD-10-CM | POA: Diagnosis not present

## 2019-12-16 DIAGNOSIS — Z125 Encounter for screening for malignant neoplasm of prostate: Secondary | ICD-10-CM | POA: Diagnosis not present

## 2019-12-16 DIAGNOSIS — D649 Anemia, unspecified: Secondary | ICD-10-CM | POA: Diagnosis not present

## 2019-12-20 DIAGNOSIS — R809 Proteinuria, unspecified: Secondary | ICD-10-CM | POA: Diagnosis not present

## 2019-12-20 DIAGNOSIS — R82998 Other abnormal findings in urine: Secondary | ICD-10-CM | POA: Diagnosis not present

## 2019-12-21 DIAGNOSIS — Z1212 Encounter for screening for malignant neoplasm of rectum: Secondary | ICD-10-CM | POA: Diagnosis not present

## 2019-12-23 DIAGNOSIS — F419 Anxiety disorder, unspecified: Secondary | ICD-10-CM | POA: Diagnosis not present

## 2019-12-23 DIAGNOSIS — N3281 Overactive bladder: Secondary | ICD-10-CM | POA: Diagnosis not present

## 2019-12-23 DIAGNOSIS — N401 Enlarged prostate with lower urinary tract symptoms: Secondary | ICD-10-CM | POA: Diagnosis not present

## 2019-12-23 DIAGNOSIS — M810 Age-related osteoporosis without current pathological fracture: Secondary | ICD-10-CM | POA: Diagnosis not present

## 2019-12-23 DIAGNOSIS — M545 Low back pain: Secondary | ICD-10-CM | POA: Diagnosis not present

## 2019-12-23 DIAGNOSIS — G44209 Tension-type headache, unspecified, not intractable: Secondary | ICD-10-CM | POA: Diagnosis not present

## 2019-12-23 DIAGNOSIS — R490 Dysphonia: Secondary | ICD-10-CM | POA: Diagnosis not present

## 2019-12-23 DIAGNOSIS — C61 Malignant neoplasm of prostate: Secondary | ICD-10-CM | POA: Diagnosis not present

## 2019-12-23 DIAGNOSIS — Z0000 Encounter for general adult medical examination without abnormal findings: Secondary | ICD-10-CM | POA: Diagnosis not present

## 2019-12-23 DIAGNOSIS — R809 Proteinuria, unspecified: Secondary | ICD-10-CM | POA: Diagnosis not present

## 2019-12-23 DIAGNOSIS — R002 Palpitations: Secondary | ICD-10-CM | POA: Diagnosis not present

## 2020-01-05 DIAGNOSIS — R350 Frequency of micturition: Secondary | ICD-10-CM | POA: Diagnosis not present

## 2020-01-05 DIAGNOSIS — R3912 Poor urinary stream: Secondary | ICD-10-CM | POA: Diagnosis not present

## 2020-01-05 DIAGNOSIS — R3914 Feeling of incomplete bladder emptying: Secondary | ICD-10-CM | POA: Diagnosis not present

## 2020-01-05 DIAGNOSIS — N401 Enlarged prostate with lower urinary tract symptoms: Secondary | ICD-10-CM | POA: Diagnosis not present

## 2020-01-05 DIAGNOSIS — R300 Dysuria: Secondary | ICD-10-CM | POA: Diagnosis not present

## 2020-01-06 DIAGNOSIS — R32 Unspecified urinary incontinence: Secondary | ICD-10-CM | POA: Diagnosis not present

## 2020-01-13 DIAGNOSIS — Z85828 Personal history of other malignant neoplasm of skin: Secondary | ICD-10-CM | POA: Diagnosis not present

## 2020-01-13 DIAGNOSIS — L270 Generalized skin eruption due to drugs and medicaments taken internally: Secondary | ICD-10-CM | POA: Diagnosis not present

## 2020-01-18 DIAGNOSIS — H40013 Open angle with borderline findings, low risk, bilateral: Secondary | ICD-10-CM | POA: Diagnosis not present

## 2020-01-20 ENCOUNTER — Other Ambulatory Visit: Payer: Self-pay

## 2020-01-20 ENCOUNTER — Encounter: Payer: Self-pay | Admitting: Neurology

## 2020-01-20 ENCOUNTER — Ambulatory Visit (INDEPENDENT_AMBULATORY_CARE_PROVIDER_SITE_OTHER): Payer: PPO | Admitting: Neurology

## 2020-01-20 VITALS — BP 104/62 | HR 56 | Ht 73.0 in | Wt 186.0 lb

## 2020-01-20 DIAGNOSIS — G43019 Migraine without aura, intractable, without status migrainosus: Secondary | ICD-10-CM

## 2020-01-20 DIAGNOSIS — R413 Other amnesia: Secondary | ICD-10-CM

## 2020-01-20 MED ORDER — BUPROPION HCL ER (SR) 100 MG PO TB12: 100.0000 mg | ORAL_TABLET | Freq: Every day | ORAL | 3 refills | Status: DC

## 2020-01-20 NOTE — Progress Notes (Signed)
Reason for visit: Migraine headache, mild memory problem  Dylan Woods is an 74 y.o. male  History of present illness:  Dylan. Woods is a 74 year old right-handed white male with a history of migraine headache that has been very well controlled with Topamax.  When he tried to come off the medication several years ago, he had increased headache and decided to go back on the drug.  He is on 75 mg at night.  He reports some memory problems but it is more of a problem with long-term memory not short-term memory.  He still works some as an Forensic psychologist and does well, he is functioning quite well with day-to-day activities.  He has a history of prostate cancer and because of this he has been left with some urinary frequency and incontinence.  The patient believes that this is a source of significant stress for him and has resulted in some underlying depression.  The patient is able to sleep well at night by using desmopressin that reduces his need to use the bathroom.  He returns to the office today for an evaluation.  Past Medical History:  Diagnosis Date  . Anemia   . Bladder outlet obstruction   . Degenerative arthritis   . Diverticulosis   . Foley catheter in place   . Heart murmur   . History of chest pain    per pt and epic documentation -- cardiac work-up done --- per dr Marlou Porch note felt to not be cardiac related  . History of radiation therapy    boost w/ radioactive prostate seed implants, 110Gy; then IMRT  45Gy in 25 fractions , 01-20-2017 to 02-24-2017  . History of urinary retention 12/23/2016   post surgery 12-18-2016  . Hyperplasia of prostate with lower urinary tract symptoms (LUTS)   . Intermittent palpitations    mostly at night--  cardiologist visit w/ dr Marlou Porch 08-07-2017 in epic, ekg showed PACs  . Internal hemorrhoids   . Irritable bowel syndrome with diarrhea   . Migraines    neurologist-  dr [name]  . Osteoporosis   . Prostate cancer [facility]) urologist-  dr dahlstedt/   oncologist-  dr [name]   dx 03-29-2016-- Stage T1c, Gleason 4+5, PSA 5.06, vol 74.51cc--- Hormone therapy (lupron injection) prostate decreased to 47cc; 12-18-2016  radiactive seed implants;  completed external beam radiation 02-24-2017 PTNS injections.     Past Surgical History:  Procedure Laterality Date  . CARDIOVASCULAR STRESS TEST  06-19-2016   dr Marlou Porch   Intermediate risk nuclear study w/ no reversible ischemia with moderate size and intensity fixed inferoseptal and septal perfusion defect, consider artifact scar/  LVEF nuclear stress 48% (45-54%)  with septal hypokinesis to dyskinesis  . CATARACT EXTRACTION W/ INTRAOCULAR LENS  IMPLANT, BILATERAL  2011;  2004  . CIRCUMCISION  age 58  . CYSTOSCOPY N/A 12/18/2016   Procedure: CYSTOSCOPY FLEXIBLE;  Surgeon: [name], MD;  Location: [facility];  Service: Urology;  Laterality: N/A;  NO SEEDS FOUND IN BLADDER  . CYSTOSCOPY WITH INSERTION OF UROLIFT  03/ 2019   dr [name]  w/ anesthesia @ [facility]  . GOLD SEED IMPLANT N/A 12/18/2016   Procedure: GOLD SEED IMPLANT;  Surgeon: [name], MD;  Location: [facility];  Service: Urology;  Laterality: N/A;   3 MARKERS  IMPLANTED  . INGUINAL HERNIA REPAIR Right 1992  . KNEE ARTHROSCOPY Left 2003;  2008  . NASAL SINUS SURGERY  2015  . PROSTATE  BIOPSY  03-29-2016   dr Pilar Jarvis (office)  . RADIOACTIVE SEED IMPLANT N/A 12/18/2016   Procedure: RADIOACTIVE SEED IMPLANT/BRACHYTHERAPY IMPLANT;  Surgeon: [name], MD;  Location: [facility];  Service: Urology;  Laterality: N/A;    62  SEEDS IMPLANTED  . rt foot metatarsal Right   . SPACE OAR INSTILLATION N/A 12/18/2016   Procedure: SPACE OAR INSTILLATION;  Surgeon: [name], MD;  Location: [facility];  Service: Urology;  Laterality: N/A;  . TRANSURETHRAL RESECTION OF PROSTATE N/A 10/31/2017   Procedure: TRANSURETHRAL RESECTION OF THE PROSTATE (TURP);   Surgeon: [name], MD;  Location: [facility];  Service: Urology;  Laterality: N/A;    Family History  Problem Relation Age of Onset  . Parkinsonism Father   . Colon polyps Father   . Cancer Mother        lymphoma  . Colon polyps Mother   . Cancer Brother        prostate ca s/p prostatectomy    Social history:  reports that he quit smoking about 50 years ago. His smoking use included cigarettes. He quit after 6.00 years of use. He has never used smokeless tobacco. He reports previous alcohol use. He reports that he does not use drugs.    Allergies  Allergen Reactions  . Ampicillin Rash    Has patient had a PCN reaction causing immediate rash, facial/tongue/throat swelling, SOB or lightheadedness with hypotension: Yes Has patient had a PCN reaction causing severe rash involving mucus membranes or skin necrosis: No Has patient had a PCN reaction that required hospitalization: No Has patient had a PCN reaction occurring within the last 10 years: No If all of the above answers are "NO", then may proceed with Cephalosporin use.   Marland Kitchen Ketorolac Other (See Comments)    "Makes him feel like he is crazy"  . Betadine [Povidone Iodine] Other (See Comments)    " irritates skin, burns and stings"    Medications:  Prior to Admission medications   Medication Sig Start Date End Date Taking? Authorizing Provider  acetaminophen (TYLENOL) 500 MG tablet Take 500 mg by mouth every 6 (six) hours as needed.   Yes [provider]  acetaminophen-codeine (TYLENOL #3) 300-30 MG tablet Take 1 tablet by mouth daily as needed. 04/02/19  Yes [provider]  alendronate (FOSAMAX) 70 MG tablet 1 tablet once a week. 07/15/18  Yes [provider]  ALPRAZolam Duanne Moron) 0.25 MG tablet Take 0.25 mg by mouth as needed for sleep (Taking when going on air flights).    Yes [provider]  Ascorbic Acid (SM CHEWABLE VITAMIN C) 500 MG CHEW Chew 1,000 mg by mouth  daily.   Yes [provider]  CALCIUM-VITAMIN D PO Take 1 tablet by mouth 2 (two) times daily. 600-400mg  tablet    Yes [provider]  desmopressin (DDAVP) 0.2 MG tablet 0.6 mg every evening.  05/28/18  Yes [provider]  Doxylamine Succinate, Sleep, (UNISOM PO) Take by mouth. Take on tablet night for sleep   Yes [provider]  Ferrous Sulfate (IRON SUPPLEMENT PO) Take 20 mg by mouth 2 (two) times daily.   Yes [provider]  ketoconazole (NIZORAL) 2 % cream APPLY TO THE AFFECTED AREA TWICE A DAY 05/06/19  Yes [provider]  Multiple Vitamin (MULTI-VITAMINS) TABS Take 1 tablet by mouth daily. Centrum Silver for Men 50+   Yes [provider]  topiramate (TOPAMAX) 25 MG tablet  Take 3 tablets (75 mg total) by mouth at bedtime. 07/19/19  Yes Suzzanne Cloud, NP  traMADol (ULTRAM) 50 MG tablet Take 1-2 tablets (50-100 mg total) by mouth every 6 (six) hours as needed for moderate pain or severe pain. Post-operatively 11/01/17  Yes Alexis Frock, MD  valACYclovir (VALTREX) 1000 MG tablet Take 1,000 mg by mouth daily as needed (for cold sores).  11/13/15  Yes [provider]    ROS:  Out of a complete 14 system review of symptoms, the patient complains only of the following symptoms, and all other reviewed systems are negative.  Urinary urgency Memory problems  Blood pressure 104/62, pulse (!) 56, height 6\' 1"  (1.854 m), weight 186 lb (84.4 kg).  Physical Exam  General: The patient is alert and cooperative at the time of the examination.  Skin: No significant peripheral edema is noted.   Neurologic Exam  Mental status: The patient is alert and oriented x 3 at the time of the examination. The patient has apparent normal recent and remote memory, with an apparently normal attention span and concentration ability.   Cranial nerves: Facial symmetry is present. Speech is normal, no aphasia or dysarthria is noted.  Extraocular movements are full. Visual fields are full.  Motor: The patient has good strength in all 4 extremities.  Sensory examination: Soft touch sensation is symmetric on the face, arms, and legs.  Coordination: The patient has good finger-nose-finger and heel-to-shin bilaterally.  Gait and station: The patient has a normal gait. Tandem gait is unsteady. Romberg is negative. No drift is seen.  Reflexes: Deep tendon reflexes are symmetric.   Assessment/Plan:  1.  History of migraine headaches, well controlled  2.  Urinary frequency  3.  Mild memory disorder  4.  Mild depression  The patient wishes to have a medication for depression, I will start Wellbutrin.  He is doing well with Topamax, it is possible that this may be causing some problems with his long-term memory issues, he is not having any short-term memory disabilities at this time.  He will let me know if he wishes to come off of Topamax in future, we may try switching him to Depakote.  He will follow up here in 1 year, sooner if needed.   Jill Alexanders MD 01/20/2020 1:47 PM  [facility] [address] [place], [place] [address]  Phone [phone] Fax [phone]

## 2020-02-03 DIAGNOSIS — C61 Malignant neoplasm of prostate: Secondary | ICD-10-CM | POA: Diagnosis not present

## 2020-02-11 ENCOUNTER — Encounter (HOSPITAL_COMMUNITY): Payer: Self-pay | Admitting: Urology

## 2020-02-11 ENCOUNTER — Encounter (HOSPITAL_COMMUNITY)
Admission: AD | Disposition: A | Discharge status: Home / Self Care | Payer: Self-pay | Source: Ambulatory Visit | Attending: Urology

## 2020-02-11 ENCOUNTER — Other Ambulatory Visit: Payer: Self-pay

## 2020-02-11 ENCOUNTER — Observation Stay (HOSPITAL_COMMUNITY)
Admission: AD | Admit: 2020-02-11 | Discharge: 2020-02-12 | Disposition: A | Discharge status: Home / Self Care | Payer: PPO | Source: Ambulatory Visit | Attending: Urology | Admitting: Urology

## 2020-02-11 ENCOUNTER — Inpatient Hospital Stay (HOSPITAL_COMMUNITY): Payer: PPO | Admitting: Anesthesiology

## 2020-02-11 ENCOUNTER — Other Ambulatory Visit: Payer: Self-pay | Admitting: Urology

## 2020-02-11 DIAGNOSIS — R339 Retention of urine, unspecified: Secondary | ICD-10-CM | POA: Diagnosis present

## 2020-02-11 DIAGNOSIS — Z8546 Personal history of malignant neoplasm of prostate: Secondary | ICD-10-CM | POA: Insufficient documentation

## 2020-02-11 DIAGNOSIS — Z20822 Contact with and (suspected) exposure to covid-19: Secondary | ICD-10-CM | POA: Diagnosis not present

## 2020-02-11 DIAGNOSIS — Z87891 Personal history of nicotine dependence: Secondary | ICD-10-CM | POA: Insufficient documentation

## 2020-02-11 DIAGNOSIS — R338 Other retention of urine: Secondary | ICD-10-CM | POA: Diagnosis not present

## 2020-02-11 DIAGNOSIS — G43009 Migraine without aura, not intractable, without status migrainosus: Secondary | ICD-10-CM | POA: Diagnosis not present

## 2020-02-11 DIAGNOSIS — N420 Calculus of prostate: Principal | ICD-10-CM | POA: Insufficient documentation

## 2020-02-11 DIAGNOSIS — N210 Calculus in bladder: Secondary | ICD-10-CM | POA: Insufficient documentation

## 2020-02-11 DIAGNOSIS — M810 Age-related osteoporosis without current pathological fracture: Secondary | ICD-10-CM | POA: Diagnosis not present

## 2020-02-11 DIAGNOSIS — D649 Anemia, unspecified: Secondary | ICD-10-CM | POA: Diagnosis not present

## 2020-02-11 HISTORY — PX: CYSTOSCOPY: SHX5120

## 2020-02-11 LAB — COMPREHENSIVE METABOLIC PANEL
ALT: 18 U/L (ref 0–44)
AST: 21 U/L (ref 15–41)
Albumin: 3.8 g/dL (ref 3.5–5.0)
Alkaline Phosphatase: 61 U/L (ref 38–126)
Anion gap: 13 (ref 5–15)
BUN: 19 mg/dL (ref 8–23)
CO2: 20 mmol/L — ABNORMAL LOW (ref 22–32)
Calcium: 9.2 mg/dL (ref 8.9–10.3)
Chloride: 102 mmol/L (ref 98–111)
Creatinine, Ser: 0.85 mg/dL (ref 0.61–1.24)
GFR, Estimated: >60 mL/min (ref >60)
Glucose, Bld: 118 mg/dL — ABNORMAL HIGH (ref 70–99)
Potassium: 3.5 mmol/L (ref 3.5–5.1)
Sodium: 135 mmol/L (ref 135–145)
Total Bilirubin: 0.5 mg/dL (ref 0.3–1.2)
Total Protein: 6.6 g/dL (ref 6.5–8.1)

## 2020-02-11 LAB — CBC WITH DIFFERENTIAL/PLATELET
Abs Immature Granulocytes: 0.07 10*3/uL (ref 0.00–0.07)
Basophils Absolute: 0 10*3/uL (ref 0.0–0.1)
Basophils Relative: 0 %
Eosinophils Absolute: 0 10*3/uL (ref 0.0–0.5)
Eosinophils Relative: 0 %
HCT: 43.8 % (ref 39.0–52.0)
Hemoglobin: 14.4 g/dL (ref 13.0–17.0)
Immature Granulocytes: 1 %
Lymphocytes Relative: 7 %
Lymphs Abs: 0.8 10*3/uL (ref 0.7–4.0)
MCH: 31.2 pg (ref 26.0–34.0)
MCHC: 32.9 g/dL (ref 30.0–36.0)
MCV: 94.8 fL (ref 80.0–100.0)
Monocytes Absolute: 0.7 10*3/uL (ref 0.1–1.0)
Monocytes Relative: 6 %
Neutro Abs: 10.2 10*3/uL — ABNORMAL HIGH (ref 1.7–7.7)
Neutrophils Relative %: 86 %
Platelets: 206 10*3/uL (ref 150–400)
RBC: 4.62 MIL/uL (ref 4.22–5.81)
RDW: 12.8 % (ref 11.5–15.5)
WBC: 11.9 10*3/uL — ABNORMAL HIGH (ref 4.0–10.5)
nRBC: 0 % (ref 0.0–0.2)

## 2020-02-11 LAB — SARS CORONAVIRUS 2 BY RT PCR (HOSPITAL ORDER, PERFORMED IN ~~LOC~~ HOSPITAL LAB): SARS Coronavirus 2: NEGATIVE

## 2020-02-11 SURGERY — CYSTOSCOPY
Anesthesia: General | Site: Bladder

## 2020-02-11 MED ORDER — FENTANYL CITRATE (PF) 100 MCG/2ML IJ SOLN: 25.0000 ug | INTRAMUSCULAR | Status: DC | PRN

## 2020-02-11 MED ORDER — MIDAZOLAM HCL 2 MG/2ML IJ SOLN
INTRAMUSCULAR | Status: AC
  Filled 2020-02-11: qty 2

## 2020-02-11 MED ORDER — BELLADONNA ALKALOIDS-OPIUM 16.2-30 MG RE SUPP
RECTAL | Status: AC
  Filled 2020-02-11: qty 1

## 2020-02-11 MED ORDER — CIPROFLOXACIN HCL 500 MG PO TABS: 500.0000 mg | ORAL_TABLET | Freq: Two times a day (BID) | ORAL | 0 refills | Status: AC

## 2020-02-11 MED ORDER — MORPHINE SULFATE (PF) 4 MG/ML IV SOLN
INTRAVENOUS | Status: AC
Start: 2020-02-11 — End: 2020-02-12
  Filled 2020-02-11: qty 1

## 2020-02-11 MED ORDER — PROPOFOL 10 MG/ML IV BOLUS
INTRAVENOUS | Status: AC
  Filled 2020-02-11: qty 20

## 2020-02-11 MED ORDER — SUCCINYLCHOLINE CHLORIDE 200 MG/10ML IV SOSY
PREFILLED_SYRINGE | INTRAVENOUS | Status: DC | PRN
  Administered 2020-02-11: 120 mg via INTRAVENOUS

## 2020-02-11 MED ORDER — DEXAMETHASONE SODIUM PHOSPHATE 10 MG/ML IJ SOLN
INTRAMUSCULAR | Status: DC | PRN
  Administered 2020-02-11: 10 mg via INTRAVENOUS

## 2020-02-11 MED ORDER — BELLADONNA ALKALOIDS-OPIUM 16.2-60 MG RE SUPP
RECTAL | Status: DC | PRN
  Administered 2020-02-11: 1 via RECTAL

## 2020-02-11 MED ORDER — WATER FOR IRRIGATION, STERILE IR SOLN
Status: DC | PRN
  Administered 2020-02-11: 3000 mL via SURGICAL_CAVITY

## 2020-02-11 MED ORDER — ONDANSETRON HCL 4 MG/2ML IJ SOLN
INTRAMUSCULAR | Status: DC | PRN
  Administered 2020-02-11: 4 mg via INTRAVENOUS

## 2020-02-11 MED ORDER — FENTANYL CITRATE (PF) 100 MCG/2ML IJ SOLN
INTRAMUSCULAR | Status: DC | PRN
  Administered 2020-02-11 (×2): 50 ug via INTRAVENOUS

## 2020-02-11 MED ORDER — ONDANSETRON HCL 4 MG/2ML IJ SOLN: 4.0000 mg | Freq: Once | INTRAMUSCULAR | Status: DC | PRN

## 2020-02-11 MED ORDER — MORPHINE SULFATE (PF) 2 MG/ML IV SOLN
2.0000 mg | INTRAVENOUS | Status: AC
  Administered 2020-02-11: 2 mg via INTRAVENOUS

## 2020-02-11 MED ORDER — CIPROFLOXACIN IN D5W 400 MG/200ML IV SOLN
400.0000 mg | Freq: Once | INTRAVENOUS | Status: AC
  Administered 2020-02-11: 400 mg via INTRAVENOUS
  Filled 2020-02-11: qty 200

## 2020-02-11 MED ORDER — PROPOFOL 10 MG/ML IV BOLUS
INTRAVENOUS | Status: DC | PRN
  Administered 2020-02-11: 180 mg via INTRAVENOUS

## 2020-02-11 MED ORDER — TRAMADOL HCL 50 MG PO TABS
50.0000 mg | ORAL_TABLET | Freq: Four times a day (QID) | ORAL | 0 refills | Status: AC | PRN
Start: 2020-02-11 — End: 2020-02-14

## 2020-02-11 MED ORDER — ONDANSETRON HCL 4 MG/2ML IJ SOLN
INTRAMUSCULAR | Status: AC
  Filled 2020-02-11: qty 2

## 2020-02-11 MED ORDER — LACTATED RINGERS IV SOLN: INTRAVENOUS | Status: DC

## 2020-02-11 MED ORDER — LIDOCAINE 2% (20 MG/ML) 5 ML SYRINGE
INTRAMUSCULAR | Status: DC | PRN
  Administered 2020-02-11: 80 mg via INTRAVENOUS

## 2020-02-11 MED ORDER — FENTANYL CITRATE (PF) 100 MCG/2ML IJ SOLN
INTRAMUSCULAR | Status: AC
  Filled 2020-02-11: qty 2

## 2020-02-11 MED ORDER — LIDOCAINE 2% (20 MG/ML) 5 ML SYRINGE
INTRAMUSCULAR | Status: AC
  Filled 2020-02-11: qty 5

## 2020-02-11 MED ORDER — SUCCINYLCHOLINE CHLORIDE 200 MG/10ML IV SOSY
PREFILLED_SYRINGE | INTRAVENOUS | Status: AC
  Filled 2020-02-11: qty 10

## 2020-02-11 MED ORDER — DEXAMETHASONE SODIUM PHOSPHATE 10 MG/ML IJ SOLN
INTRAMUSCULAR | Status: AC
  Filled 2020-02-11: qty 1

## 2020-02-11 SURGICAL SUPPLY — 16 items
BAG DRAINAGE 600ML DEPOT (BAG) ×3 IMPLANT
BAG URO CATCHER STRL LF (MISCELLANEOUS) ×3 IMPLANT
CATH TIEMANN FOLEY 18FR 5CC (CATHETERS) ×3 IMPLANT
CATH URET 5FR 28IN OPEN ENDED (CATHETERS) IMPLANT
CLOTH BEACON ORANGE TIMEOUT ST (SAFETY) ×3 IMPLANT
GLOVE BIOGEL M STRL SZ7.5 (GLOVE) ×3 IMPLANT
GOWN STRL REUS W/TWL XL LVL3 (GOWN DISPOSABLE) ×3 IMPLANT
GUIDEWIRE STR DUAL SENSOR (WIRE) IMPLANT
GUIDEWIRE ZIPWRE .038 STRAIGHT (WIRE) ×3 IMPLANT
KIT TURNOVER KIT A (KITS) IMPLANT
MANIFOLD NEPTUNE II (INSTRUMENTS) ×3 IMPLANT
PACK CYSTO (CUSTOM PROCEDURE TRAY) ×3 IMPLANT
SYR TOOMEY IRRIG 70ML (MISCELLANEOUS) ×3
SYRINGE TOOMEY IRRIG 70ML (MISCELLANEOUS) ×1 IMPLANT
TUBING CONNECTING 10 (TUBING) ×2 IMPLANT
TUBING CONNECTING 10' (TUBING) ×1

## 2020-02-11 NOTE — Anesthesia Preprocedure Evaluation (Addendum)
Anesthesia Evaluation  Patient identified by MRN, date of birth, ID band Patient awake    Reviewed: Allergy & Precautions, NPO status , Patient's Chart, lab work & pertinent test results  Airway Mallampati: II  TM Distance: >3 FB Neck ROM: Full    Dental  (+) Dental Advisory Given, Teeth Intact   Pulmonary former smoker,    Pulmonary exam normal breath sounds clear to auscultation       Cardiovascular negative cardio ROS Normal cardiovascular exam+ Valvular Problems/Murmurs  Rhythm:Regular Rate:Normal     Neuro/Psych  Headaches, negative psych ROS   GI/Hepatic negative GI ROS, Neg liver ROS,   Endo/Other  negative endocrine ROS  Renal/GU negative Renal ROS     Musculoskeletal  (+) Arthritis ,   Abdominal   Peds  Hematology  (+) Blood dyscrasia, anemia ,   Anesthesia Other Findings   Reproductive/Obstetrics                            Anesthesia Physical  Anesthesia Plan  ASA: II  Anesthesia Plan: General   Post-op Pain Management:    Induction: Intravenous, Rapid sequence and Cricoid pressure planned  PONV Risk Score and Plan: 3 and Ondansetron, Dexamethasone and Treatment may vary due to age or medical condition  Airway Management Planned: Oral ETT  Additional Equipment: None  Intra-op Plan:   Post-operative Plan: Extubation in OR  Informed Consent: I have reviewed the patients History and Physical, chart, labs and discussed the procedure including the risks, benefits and alternatives for the proposed anesthesia with the patient or authorized representative who has indicated his/her understanding and acceptance.     Dental advisory given  Plan Discussed with: CRNA  Anesthesia Plan Comments:       Anesthesia Quick Evaluation

## 2020-02-11 NOTE — Discharge Instructions (Signed)
General Anesthesia, Adult, Care After This sheet gives you information about how to care for yourself after your procedure. Your health care provider may also give you more specific instructions. If you have problems or questions, contact your health care provider. What can I expect after the procedure? After the procedure, the following side effects are common:  Pain or discomfort at the IV site.  Nausea.  Vomiting.  Sore throat.  Trouble concentrating.  Feeling cold or chills.  Weak or tired.  Sleepiness and fatigue.  Soreness and body aches. These side effects can affect parts of the body that were not involved in surgery. Follow these instructions at home:  For at least 24 hours after the procedure:  Have a responsible adult stay with you. It is important to have someone help care for you until you are awake and alert.  Rest as needed.  Do not: ? Participate in activities in which you could fall or become injured. ? Drive. ? Use heavy machinery. ? Drink alcohol. ? Take sleeping pills or medicines that cause drowsiness. ? Make important decisions or sign legal documents. ? Take care of children on your own. Eating and drinking  Follow any instructions from your health care provider about eating or drinking restrictions.  When you feel hungry, start by eating small amounts of foods that are soft and easy to digest (bland), such as toast. Gradually return to your regular diet.  Drink enough fluid to keep your urine pale yellow.  If you vomit, rehydrate by drinking water, juice, or clear broth. General instructions  If you have sleep apnea, surgery and certain medicines can increase your risk for breathing problems. Follow instructions from your health care provider about wearing your sleep device: ? Anytime you are sleeping, including during daytime naps. ? While taking prescription pain medicines, sleeping medicines, or medicines that make you drowsy.  Return to  your normal activities as told by your health care provider. Ask your health care provider what activities are safe for you.  Take over-the-counter and prescription medicines only as told by your health care provider.  If you smoke, do not smoke without supervision.  Keep all follow-up visits as told by your health care provider. This is important. Contact a health care provider if:  You have nausea or vomiting that does not get better with medicine.  You cannot eat or drink without vomiting.  You have pain that does not get better with medicine.  You are unable to pass urine.  You develop a skin rash.  You have a fever.  You have redness around your IV site that gets worse. Get help right away if:  You have difficulty breathing.  You have chest pain.  You have blood in your urine or stool, or you vomit blood. Summary  After the procedure, it is common to have a sore throat or nausea. It is also common to feel tired.  Have a responsible adult stay with you for the first 24 hours after general anesthesia. It is important to have someone help care for you until you are awake and alert.  When you feel hungry, start by eating small amounts of foods that are soft and easy to digest (bland), such as toast. Gradually return to your regular diet.  Drink enough fluid to keep your urine pale yellow.  Return to your normal activities as told by your health care provider. Ask your health care provider what activities are safe for you. This information is not   intended to replace advice given to you by your health care provider. Make sure you discuss any questions you have with your health care provider. Document Revised: 04/11/2017 Document Reviewed: 11/22/2016 Elsevier Patient Education  2020 Elsevier Inc.  

## 2020-02-11 NOTE — H&P (Signed)
Urology Preoperative H&P   Chief Complaint: Urinary retention  History of Present Illness: Dylan Woods is a 74 y.o. male with a history of Gleason 4+5 = 9 prostate cancer that was diagnosed in 2017 and subsequently treated with brachytherapy in 2018.  Following brachytherapy, the patient developed significant lower urinary tract symptoms and required UroLift implants as well as a channel TURP.  The patient presented to the office today with worsening lower urinary tract symptoms and an inability to void.  Multiple attempts were made in the office to place a Foley catheter, but were unsuccessful.  Past Medical History:  Diagnosis Date  . Anemia   . Bladder outlet obstruction   . Degenerative arthritis   . Diverticulosis   . Foley catheter in place   . Heart murmur   . History of chest pain    per pt and epic documentation -- cardiac work-up done --- per dr Marlou Porch note felt to not be cardiac related  . History of radiation therapy    boost w/ radioactive prostate seed implants, 110Gy; then IMRT  45Gy in 25 fractions , 01-20-2017 to 02-24-2017  . History of urinary retention 12/23/2016   post surgery 12-18-2016  . Hyperplasia of prostate with lower urinary tract symptoms (LUTS)   . Intermittent palpitations    mostly at night--  cardiologist visit w/ dr Marlou Porch 08-07-2017 in epic, ekg showed PACs  . Internal hemorrhoids   . Irritable bowel syndrome with diarrhea   . Migraines    neurologist-  dr [name]  . Osteoporosis   . Prostate cancer [facility]) urologist-  dr dahlstedt/  oncologist-  dr [name]   dx 03-29-2016-- Stage T1c, Gleason 4+5, PSA 5.06, vol 74.51cc--- Hormone therapy (lupron injection) prostate decreased to 47cc; 12-18-2016  radiactive seed implants;  completed external beam radiation 02-24-2017 PTNS injections.     Past Surgical History:  Procedure Laterality Date  . CARDIOVASCULAR STRESS TEST  06-19-2016   dr Marlou Porch   Intermediate risk nuclear study w/ no reversible  ischemia with moderate size and intensity fixed inferoseptal and septal perfusion defect, consider artifact scar/  LVEF nuclear stress 48% (45-54%)  with septal hypokinesis to dyskinesis  . CATARACT EXTRACTION W/ INTRAOCULAR LENS  IMPLANT, BILATERAL  2011;  2004  . CIRCUMCISION  age 48  . CYSTOSCOPY N/A 12/18/2016   Procedure: CYSTOSCOPY FLEXIBLE;  Surgeon: [name], MD;  Location: [facility];  Service: Urology;  Laterality: N/A;  NO SEEDS FOUND IN BLADDER  . CYSTOSCOPY WITH INSERTION OF UROLIFT  03/ 2019   dr [name]  w/ anesthesia @ [facility]  . GOLD SEED IMPLANT N/A 12/18/2016   Procedure: GOLD SEED IMPLANT;  Surgeon: [name], MD;  Location: [facility];  Service: Urology;  Laterality: N/A;   3 MARKERS  IMPLANTED  . INGUINAL HERNIA REPAIR Right 1992  . KNEE ARTHROSCOPY Left 2003;  2008  . NASAL SINUS SURGERY  2015  . PROSTATE BIOPSY  03-29-2016   dr Pilar Jarvis (office)  . RADIOACTIVE SEED IMPLANT N/A 12/18/2016   Procedure: RADIOACTIVE SEED IMPLANT/BRACHYTHERAPY IMPLANT;  Surgeon: [name], MD;  Location: [facility];  Service: Urology;  Laterality: N/A;    62  SEEDS IMPLANTED  . rt foot metatarsal Right   . SPACE OAR INSTILLATION N/A 12/18/2016   Procedure: SPACE OAR INSTILLATION;  Surgeon: [name], MD;  Location: [facility];  Service: Urology;  Laterality: N/A;  . TRANSURETHRAL RESECTION OF PROSTATE  N/A 10/31/2017   Procedure: TRANSURETHRAL RESECTION OF THE PROSTATE (TURP);  Surgeon: [name], MD;  Location: [facility];  Service: Urology;  Laterality: N/A;    Allergies:  Allergies  Allergen Reactions  . Ampicillin Rash    Has patient had a PCN reaction causing immediate rash, facial/tongue/throat swelling, SOB or lightheadedness with hypotension: Yes Has patient had a PCN reaction causing severe rash involving mucus membranes or skin necrosis: No Has  patient had a PCN reaction that required hospitalization: No Has patient had a PCN reaction occurring within the last 10 years: No If all of the above answers are "NO", then may proceed with Cephalosporin use.   Marland Kitchen Ketorolac Other (See Comments)    "Makes him feel like he is crazy"  . Betadine [Povidone Iodine] Other (See Comments)    " irritates skin, burns and stings"    Family History  Problem Relation Age of Onset  . Parkinsonism Father   . Colon polyps Father   . Cancer Mother        lymphoma  . Colon polyps Mother   . Cancer Brother        prostate ca s/p prostatectomy    Social History:  reports that he quit smoking about 50 years ago. His smoking use included cigarettes. He quit after 6.00 years of use. He has never used smokeless tobacco. He reports previous alcohol use. He reports that he does not use drugs.  ROS: A complete review of systems was performed.  All systems are negative except for pertinent findings as noted.  Physical Exam:  Vital signs in last 24 hours: Weight:  [82.1 kg] 82.1 kg (10/22 1732) Constitutional:  Alert and oriented, No acute distress Cardiovascular: Regular rate and rhythm, No JVD Respiratory: Normal respiratory effort, Lungs clear bilaterally GI: Abdomen is soft, nontender, nondistended, no abdominal masses GU: No CVA tenderness Lymphatic: No lymphadenopathy Neurologic: Grossly intact, no focal deficits Psychiatric: Normal mood and affect  Laboratory Data:  No results for input(s): WBC, HGB, HCT, PLT in the last 72 hours.  No results for input(s): NA, K, CL, GLUCOSE, BUN, CALCIUM, CREATININE in the last 72 hours.  Invalid input(s): CO3   No results found for this or any previous visit (from the past 24 hour(s)). No results found for this or any previous visit (from the past 240 hour(s)).  Renal Function: No results for input(s): CREATININE in the last 168 hours. CrCl cannot be calculated (Patient's most recent lab result is  older than the maximum 21 days allowed.).  Radiologic Imaging: No results found.  I independently reviewed the above imaging studies.  Assessment and Plan JOCK MAHON is a 74 y.o. male with acute urinary retention.  History of prostate cancer treated with brachytherapy in 2018 and significant lower Neri tract symptoms.  The risk, benefits and alternatives of cystoscopy, possible urethral dilation, suprapubic tube placement and Foley catheter placement were discussed with the patient in detail.  Risk include, not limited to, bleeding, urinary tract infection, urethral stricture formation/recurrence, MI, CVA, DVT and the inherent risk of general anesthesia.  He voices understanding and wishes to proceed.    Ellison Hughs, MD 02/11/2020, 5:44 PM  [facility] Pager: [phone]

## 2020-02-11 NOTE — Op Note (Signed)
Operative Note  Preoperative diagnosis:  1.  Urinary retention  Postoperative diagnosis: 1.  Urinary retention 2.  Adherent 5 mm prostatic urethral calculus 3.  Multiple bladder stones, the largest of which measuring approximately 2 cm  Procedure(s): 1.  Cystoscopy with prostatic and bladder stone extraction and Foley catheter placement  Surgeon: Ellison Hughs, MD  Assistants:  None  Anesthesia:  General  Complications:  None  EBL: 5 mL  Specimens: 1.  Prostatic urethral and bladder stones  Drains/Catheters: 1.  18 French coud catheter with 10 mL in the balloon  Intraoperative findings:   1. There was an adherent 5 mm prostatic urethral calculus as well as numerous bladder calculi, the largest of which measuring approximately 2 cm 2. No other intravesical lesions were identified on cystoscopy  Indication:  ELDON ZIETLOW is a 74 y.o. male with a history of prostate cancer treated with brachytherapy as well as BPH with lower urinary tract symptoms treated with UroLift and a channel TURP.  The patient presented to the office this afternoon with acute urinary retention.  After multiple failed attempts to place a catheter, the patient has been brought to the operating room for exam under anesthesia.  He has been consented for the above procedures, voices understanding and wishes to proceed.  Description of procedure:  After informed consent was obtained, the patient was brought to the operating room and general LMA anesthesia was administered. The patient was then placed in the dorsolithotomy position and prepped and draped in the usual sterile fashion. A timeout was performed. A 23 French rigid cystoscope was then inserted into the urethral meatus and advanced into the prostatic urethra where an adherent 5 mm calculus was identified.  I use the beak of the scope to disengage the calculus from the prostatic urethral mucosa and into the bladder.  The scope was eventually  advanced into the bladder where multiple bladder calculi were identified.  The majority of the stones were siphoned out of the bladder through the sheath of the cystoscope.  The largest stone, which measured approximately 2 cm of was broken into multiple smaller pieces with the endoscopic stone crusher.  All bladder and prostatic urethral stone fragments were evacuated from the bladder.  An 98 French coud catheter was then advanced into the bladder with return of light pink urine.  The catheter was extensively hand irrigated and was free of clot.  His urine remained light pink following catheter placement.  He tolerated the procedure well and was transferred to the postanesthesia in stable condition.  Plan: Follow-up with Dr. [name] for a voiding trial

## 2020-02-11 NOTE — Transfer of Care (Signed)
Immediate Anesthesia Transfer of Care Note  Patient: Dylan Woods  Procedure(s) Performed: CYSTOSCOPY bladder stone extraction placement of foley catheter (N/A Bladder)  Patient Location: PACU  Anesthesia Type:General  Level of Consciousness: awake, alert  and oriented  Airway & Oxygen Therapy: Patient Spontanous Breathing and Patient connected to face mask oxygen  Post-op Assessment: Report given to RN and Post -op Vital signs reviewed and stable  Post vital signs: Reviewed and stable  Last Vitals:  Vitals Value Taken Time  BP    Temp    Pulse 67 02/11/20 1928  Resp 14 02/11/20 1928  SpO2 100 % 02/11/20 1928  Vitals shown include unvalidated device data.  Last Pain:  Vitals:   02/11/20 1732  PainSc: 10-Worst pain ever         Complications: No complications documented.

## 2020-02-11 NOTE — Anesthesia Procedure Notes (Signed)
Procedure Name: Intubation Date/Time: 02/11/2020 6:53 PM Performed by: Amirra Herling D, CRNA Pre-anesthesia Checklist: Patient identified, Emergency Drugs available, Suction available and Patient being monitored Patient Re-evaluated:Patient Re-evaluated prior to induction Oxygen Delivery Method: Circle system utilized Preoxygenation: Pre-oxygenation with 100% oxygen Induction Type: IV induction Ventilation: Mask ventilation without difficulty Laryngoscope Size: Miller and 2 Grade View: Grade I Tube type: Oral Tube size: 7.5 mm Number of attempts: 1 Airway Equipment and Method: Stylet Placement Confirmation: ETT inserted through vocal cords under direct vision,  positive ETCO2 and breath sounds checked- equal and bilateral Secured at: 22 cm Tube secured with: Tape Dental Injury: Teeth and Oropharynx as per pre-operative assessment

## 2020-02-12 DIAGNOSIS — N420 Calculus of prostate: Secondary | ICD-10-CM | POA: Diagnosis not present

## 2020-02-12 MED ORDER — BUPROPION HCL ER (SR) 100 MG PO TB12
100.0000 mg | ORAL_TABLET | Freq: Every day | ORAL | Status: DC
  Filled 2020-02-12: qty 1

## 2020-02-12 MED ORDER — SODIUM CHLORIDE 0.9 % IV SOLN: INTRAVENOUS | Status: DC

## 2020-02-12 MED ORDER — ACETAMINOPHEN 325 MG PO TABS
650.0000 mg | ORAL_TABLET | ORAL | Status: DC | PRN
  Administered 2020-02-12: 650 mg via ORAL
  Filled 2020-02-12: qty 2

## 2020-02-12 MED ORDER — ONDANSETRON HCL 4 MG/2ML IJ SOLN: 4.0000 mg | INTRAMUSCULAR | Status: DC | PRN

## 2020-02-12 MED ORDER — CIPROFLOXACIN HCL 500 MG PO TABS
500.0000 mg | ORAL_TABLET | Freq: Two times a day (BID) | ORAL | Status: DC
  Administered 2020-02-12: 500 mg via ORAL
  Filled 2020-02-12 (×2): qty 1

## 2020-02-12 MED ORDER — ALPRAZOLAM 0.25 MG PO TABS: 0.2500 mg | ORAL_TABLET | ORAL | Status: DC | PRN

## 2020-02-12 MED ORDER — DIPHENHYDRAMINE HCL 50 MG/ML IJ SOLN: 12.5000 mg | Freq: Four times a day (QID) | INTRAMUSCULAR | Status: DC | PRN

## 2020-02-12 MED ORDER — HYDROCODONE-ACETAMINOPHEN 5-325 MG PO TABS: 1.0000 | ORAL_TABLET | ORAL | Status: DC | PRN

## 2020-02-12 MED ORDER — TOPIRAMATE 25 MG PO TABS
75.0000 mg | ORAL_TABLET | Freq: Every day | ORAL | Status: DC
  Administered 2020-02-12: 75 mg via ORAL
  Filled 2020-02-12: qty 3

## 2020-02-12 MED ORDER — MORPHINE SULFATE (PF) 2 MG/ML IV SOLN: 2.0000 mg | INTRAVENOUS | Status: DC | PRN

## 2020-02-12 MED ORDER — OXYBUTYNIN CHLORIDE 5 MG PO TABS: 5.0000 mg | ORAL_TABLET | Freq: Three times a day (TID) | ORAL | Status: DC | PRN

## 2020-02-12 MED ORDER — DIPHENHYDRAMINE HCL 12.5 MG/5ML PO ELIX: 12.5000 mg | ORAL_SOLUTION | Freq: Four times a day (QID) | ORAL | Status: DC | PRN

## 2020-02-12 MED ORDER — BELLADONNA ALKALOIDS-OPIUM 16.2-60 MG RE SUPP: 1.0000 | Freq: Four times a day (QID) | RECTAL | Status: DC | PRN

## 2020-02-12 NOTE — Progress Notes (Signed)
Per Amy RN PACU plan for pt to discharge with foley catheter.

## 2020-02-12 NOTE — Discharge Summary (Signed)
Date of admission: 02/11/2020  Date of discharge: 02/12/2020  Admission diagnosis: Urinary retention with difficult Foley catheter placement  Discharge diagnosis: Urinary retention with difficult Foley catheter placement  Secondary diagnoses: History of prostate cancer with multiple prostatic urethral and bladder stones  History and Physical: For full details, please see admission history and physical. Briefly, Dylan Woods is a 74 y.o. year old patient with a history of prostate cancer treated with radiation in 2018 resulting in significant lower urinary tract symptoms. He presented to the office yesterday with urinary retention. Multiple attempts in the office were made to place a Foley catheter, but were unsuccessful. The patient was taken the operating room on 02/11/2020 and found to have multiple prostatic urethral and bladder stones that were subsequently removed. He had a Foley catheter placed intraoperatively.  Hospital Course: Due to the patient living alone and not having anybody to stay with him overnight, the patient was placed under observation. He was discharged home with his Foley catheter with plans for follow-up with Dr. [name] in the coming days for a voiding trial.   Laboratory values: Recent Labs    02/11/20 1735  HGB 14.4  HCT 43.8   Recent Labs    02/11/20 1735  CREATININE 0.85    Disposition: Home  Discharge instruction: The patient was instructed to be ambulatory but told to refrain from heavy lifting, strenuous activity, or driving.  Discharge medications:  Allergies as of 02/12/2020      Reactions   Ampicillin Rash   Has patient had a PCN reaction causing immediate rash, facial/tongue/throat swelling, SOB or lightheadedness with hypotension: Yes Has patient had a PCN reaction causing severe rash involving mucus membranes or skin necrosis: No Has patient had a PCN reaction that required hospitalization: No Has patient had a PCN reaction occurring  within the last 10 years: No If all of the above answers are "NO", then may proceed with Cephalosporin use.   Ketorolac Other (See Comments)   "Makes him feel like he is crazy"   Betadine [povidone Iodine] Other (See Comments)   " irritates skin, burns and stings"      Medication List    TAKE these medications   acetaminophen 500 MG tablet Commonly known as: TYLENOL Take 500 mg by mouth every 6 (six) hours as needed for moderate pain.   acetaminophen-codeine 300-30 MG tablet Commonly known as: TYLENOL #3 Take 1 tablet by mouth daily as needed for severe pain.   alendronate 70 MG tablet Commonly known as: FOSAMAX 1 tablet once a week. Saturday   ALPRAZolam 0.25 MG tablet Commonly known as: XANAX Take 0.25 mg by mouth daily as needed for anxiety or sleep.   buPROPion 100 MG 12 hr tablet Commonly known as: Wellbutrin SR Take 1 tablet (100 mg total) by mouth daily.   CALCIUM-VITAMIN D PO Take 1 tablet by mouth 2 (two) times daily. 600-400mg  tablet   ciprofloxacin 500 MG tablet Commonly known as: Cipro Take 1 tablet (500 mg total) by mouth 2 (two) times daily for 3 days.   desmopressin 0.2 MG tablet Commonly known as: DDAVP Take 0.4 mg by mouth every evening.   IRON SUPPLEMENT PO Take 2 tablets by mouth 2 (two) times daily.   ketoconazole 2 % cream Commonly known as: NIZORAL Apply 1 application topically daily as needed for irritation.   Multi-Vitamins Tabs Take 1 tablet by mouth in the morning and at bedtime. Centrum Silver for Men 50+   oxybutynin 5 MG tablet Commonly  known as: DITROPAN Take 5 mg by mouth in the morning and at bedtime.   silodosin 4 MG Caps capsule Commonly known as: RAPAFLO Take 4 mg by mouth daily.   SM Chewable Vitamin C 500 MG Chew Generic drug: Ascorbic Acid Chew 1,000 mg by mouth in the morning and at bedtime.   Stendra 200 MG Tabs Generic drug: Avanafil Take 200 mg by mouth daily as needed (erectile dysfunction).   SYSTANE  COMPLETE OP Place 1 drop into both eyes daily as needed (dry eye).   topiramate 25 MG tablet Commonly known as: TOPAMAX Take 3 tablets (75 mg total) by mouth at bedtime.   traMADol 50 MG tablet Commonly known as: ULTRAM Take 1-2 tablets (50-100 mg total) by mouth every 6 (six) hours as needed for moderate pain or severe pain. Post-operatively What changed: Another medication with the same name was added. Make sure you understand how and when to take each.   traMADol 50 MG tablet Commonly known as: Ultram Take 1 tablet (50 mg total) by mouth every 6 (six) hours as needed for up to 3 days. What changed: You were already taking a medication with the same name, and this prescription was added. Make sure you understand how and when to take each.   UNISOM PO Take 1 tablet by mouth daily. Take on tablet night for sleep   valACYclovir 1000 MG tablet Commonly known as: VALTREX Take 1,000 mg by mouth daily as needed (for cold sores).       Followup:   Follow-up Information    [name], MD In 1 week.   Specialty: Urology Why: Call to schedule appointment for catheter removal Contact information: [address] [place] [address] [phone]

## 2020-02-13 NOTE — Anesthesia Postprocedure Evaluation (Signed)
Anesthesia Post Note  Patient: Dylan Woods  Procedure(s) Performed: CYSTOSCOPY bladder stone extraction placement of foley catheter (N/A Bladder)     Patient location during evaluation: PACU Anesthesia Type: General Level of consciousness: sedated and patient cooperative Pain management: pain level controlled Vital Signs Assessment: post-procedure vital signs reviewed and stable Respiratory status: spontaneous breathing Cardiovascular status: stable Anesthetic complications: no   No complications documented.  Last Vitals:  Vitals:   02/12/20 0136 02/12/20 0530  BP: 122/67 119/65  Pulse: (!) 53 (!) 54  Resp: 18   Temp: 36.4 C 36.4 C  SpO2: 97% 98%    Last Pain:  Vitals:   02/12/20 0800  TempSrc:   PainSc: 0-No pain                 Nolon Nations

## 2020-02-14 ENCOUNTER — Encounter (HOSPITAL_COMMUNITY): Payer: Self-pay | Admitting: Urology

## 2020-02-14 DIAGNOSIS — N210 Calculus in bladder: Secondary | ICD-10-CM | POA: Diagnosis not present

## 2020-02-14 DIAGNOSIS — R351 Nocturia: Secondary | ICD-10-CM | POA: Diagnosis not present

## 2020-02-14 DIAGNOSIS — N401 Enlarged prostate with lower urinary tract symptoms: Secondary | ICD-10-CM | POA: Diagnosis not present

## 2020-02-14 DIAGNOSIS — N3941 Urge incontinence: Secondary | ICD-10-CM | POA: Diagnosis not present

## 2020-02-15 DIAGNOSIS — N401 Enlarged prostate with lower urinary tract symptoms: Secondary | ICD-10-CM | POA: Diagnosis not present

## 2020-02-15 DIAGNOSIS — R351 Nocturia: Secondary | ICD-10-CM | POA: Diagnosis not present

## 2020-02-15 DIAGNOSIS — R300 Dysuria: Secondary | ICD-10-CM | POA: Diagnosis not present

## 2020-02-15 DIAGNOSIS — R3914 Feeling of incomplete bladder emptying: Secondary | ICD-10-CM | POA: Diagnosis not present

## 2020-03-22 DIAGNOSIS — Z8546 Personal history of malignant neoplasm of prostate: Secondary | ICD-10-CM | POA: Diagnosis not present

## 2020-03-22 DIAGNOSIS — N401 Enlarged prostate with lower urinary tract symptoms: Secondary | ICD-10-CM | POA: Diagnosis not present

## 2020-03-22 DIAGNOSIS — N210 Calculus in bladder: Secondary | ICD-10-CM | POA: Diagnosis not present

## 2020-03-22 DIAGNOSIS — R3914 Feeling of incomplete bladder emptying: Secondary | ICD-10-CM | POA: Diagnosis not present

## 2020-04-19 DIAGNOSIS — Z1159 Encounter for screening for other viral diseases: Secondary | ICD-10-CM | POA: Diagnosis not present

## 2020-04-21 ENCOUNTER — Other Ambulatory Visit: Payer: Self-pay | Admitting: Neurology

## 2020-05-09 DIAGNOSIS — L821 Other seborrheic keratosis: Secondary | ICD-10-CM | POA: Diagnosis not present

## 2020-05-09 DIAGNOSIS — D1801 Hemangioma of skin and subcutaneous tissue: Secondary | ICD-10-CM | POA: Diagnosis not present

## 2020-05-09 DIAGNOSIS — L718 Other rosacea: Secondary | ICD-10-CM | POA: Diagnosis not present

## 2020-05-09 DIAGNOSIS — D225 Melanocytic nevi of trunk: Secondary | ICD-10-CM | POA: Diagnosis not present

## 2020-05-09 DIAGNOSIS — C4442 Squamous cell carcinoma of skin of scalp and neck: Secondary | ICD-10-CM | POA: Diagnosis not present

## 2020-05-09 DIAGNOSIS — Z85828 Personal history of other malignant neoplasm of skin: Secondary | ICD-10-CM | POA: Diagnosis not present

## 2020-05-09 DIAGNOSIS — L570 Actinic keratosis: Secondary | ICD-10-CM | POA: Diagnosis not present

## 2020-05-09 DIAGNOSIS — D485 Neoplasm of uncertain behavior of skin: Secondary | ICD-10-CM | POA: Diagnosis not present

## 2020-07-12 DIAGNOSIS — Z8546 Personal history of malignant neoplasm of prostate: Secondary | ICD-10-CM | POA: Diagnosis not present

## 2020-07-12 DIAGNOSIS — R5383 Other fatigue: Secondary | ICD-10-CM | POA: Diagnosis not present

## 2020-07-12 DIAGNOSIS — M47816 Spondylosis without myelopathy or radiculopathy, lumbar region: Secondary | ICD-10-CM | POA: Diagnosis not present

## 2020-07-12 DIAGNOSIS — M546 Pain in thoracic spine: Secondary | ICD-10-CM | POA: Diagnosis not present

## 2020-07-12 DIAGNOSIS — E559 Vitamin D deficiency, unspecified: Secondary | ICD-10-CM | POA: Diagnosis not present

## 2020-07-12 DIAGNOSIS — M810 Age-related osteoporosis without current pathological fracture: Secondary | ICD-10-CM | POA: Diagnosis not present

## 2020-07-17 ENCOUNTER — Other Ambulatory Visit: Payer: Self-pay

## 2020-07-17 MED ORDER — TOPIRAMATE 25 MG PO TABS
75.0000 mg | ORAL_TABLET | Freq: Every day | ORAL | 3 refills | Status: DC
Start: 2020-07-17 — End: 2021-02-01

## 2020-07-19 DIAGNOSIS — Z8546 Personal history of malignant neoplasm of prostate: Secondary | ICD-10-CM | POA: Diagnosis not present

## 2020-07-19 DIAGNOSIS — N5201 Erectile dysfunction due to arterial insufficiency: Secondary | ICD-10-CM | POA: Diagnosis not present

## 2020-07-19 DIAGNOSIS — R351 Nocturia: Secondary | ICD-10-CM | POA: Diagnosis not present

## 2020-07-19 DIAGNOSIS — N401 Enlarged prostate with lower urinary tract symptoms: Secondary | ICD-10-CM | POA: Diagnosis not present

## 2020-08-25 DIAGNOSIS — M4005 Postural kyphosis, thoracolumbar region: Secondary | ICD-10-CM | POA: Diagnosis not present

## 2020-10-30 DIAGNOSIS — Z1152 Encounter for screening for COVID-19: Secondary | ICD-10-CM | POA: Diagnosis not present

## 2020-10-30 DIAGNOSIS — J208 Acute bronchitis due to other specified organisms: Secondary | ICD-10-CM | POA: Diagnosis not present

## 2020-10-30 DIAGNOSIS — R059 Cough, unspecified: Secondary | ICD-10-CM | POA: Diagnosis not present

## 2020-10-30 DIAGNOSIS — R509 Fever, unspecified: Secondary | ICD-10-CM | POA: Diagnosis not present

## 2020-10-30 DIAGNOSIS — R5383 Other fatigue: Secondary | ICD-10-CM | POA: Diagnosis not present

## 2020-10-30 DIAGNOSIS — R0989 Other specified symptoms and signs involving the circulatory and respiratory systems: Secondary | ICD-10-CM | POA: Diagnosis not present

## 2020-12-28 DIAGNOSIS — Z79899 Other long term (current) drug therapy: Secondary | ICD-10-CM | POA: Diagnosis not present

## 2020-12-28 DIAGNOSIS — M810 Age-related osteoporosis without current pathological fracture: Secondary | ICD-10-CM | POA: Diagnosis not present

## 2020-12-28 DIAGNOSIS — Z125 Encounter for screening for malignant neoplasm of prostate: Secondary | ICD-10-CM | POA: Diagnosis not present

## 2020-12-28 DIAGNOSIS — Z0000 Encounter for general adult medical examination without abnormal findings: Secondary | ICD-10-CM | POA: Diagnosis not present

## 2020-12-29 DIAGNOSIS — R809 Proteinuria, unspecified: Secondary | ICD-10-CM | POA: Diagnosis not present

## 2020-12-29 DIAGNOSIS — R82998 Other abnormal findings in urine: Secondary | ICD-10-CM | POA: Diagnosis not present

## 2021-01-03 DIAGNOSIS — Z1212 Encounter for screening for malignant neoplasm of rectum: Secondary | ICD-10-CM | POA: Diagnosis not present

## 2021-01-04 DIAGNOSIS — R002 Palpitations: Secondary | ICD-10-CM | POA: Diagnosis not present

## 2021-01-04 DIAGNOSIS — F419 Anxiety disorder, unspecified: Secondary | ICD-10-CM | POA: Diagnosis not present

## 2021-01-04 DIAGNOSIS — C61 Malignant neoplasm of prostate: Secondary | ICD-10-CM | POA: Diagnosis not present

## 2021-01-04 DIAGNOSIS — R490 Dysphonia: Secondary | ICD-10-CM | POA: Diagnosis not present

## 2021-01-04 DIAGNOSIS — N3281 Overactive bladder: Secondary | ICD-10-CM | POA: Diagnosis not present

## 2021-01-04 DIAGNOSIS — M810 Age-related osteoporosis without current pathological fracture: Secondary | ICD-10-CM | POA: Diagnosis not present

## 2021-01-04 DIAGNOSIS — E871 Hypo-osmolality and hyponatremia: Secondary | ICD-10-CM | POA: Diagnosis not present

## 2021-01-04 DIAGNOSIS — Z0000 Encounter for general adult medical examination without abnormal findings: Secondary | ICD-10-CM | POA: Diagnosis not present

## 2021-01-04 DIAGNOSIS — G44209 Tension-type headache, unspecified, not intractable: Secondary | ICD-10-CM | POA: Diagnosis not present

## 2021-01-04 DIAGNOSIS — R809 Proteinuria, unspecified: Secondary | ICD-10-CM | POA: Diagnosis not present

## 2021-01-04 DIAGNOSIS — M545 Low back pain, unspecified: Secondary | ICD-10-CM | POA: Diagnosis not present

## 2021-01-04 DIAGNOSIS — N401 Enlarged prostate with lower urinary tract symptoms: Secondary | ICD-10-CM | POA: Diagnosis not present

## 2021-01-04 DIAGNOSIS — Z23 Encounter for immunization: Secondary | ICD-10-CM | POA: Diagnosis not present

## 2021-01-11 DIAGNOSIS — M8589 Other specified disorders of bone density and structure, multiple sites: Secondary | ICD-10-CM | POA: Diagnosis not present

## 2021-01-12 DIAGNOSIS — C61 Malignant neoplasm of prostate: Secondary | ICD-10-CM | POA: Diagnosis not present

## 2021-01-18 DIAGNOSIS — M810 Age-related osteoporosis without current pathological fracture: Secondary | ICD-10-CM | POA: Diagnosis not present

## 2021-01-18 DIAGNOSIS — M25572 Pain in left ankle and joints of left foot: Secondary | ICD-10-CM | POA: Diagnosis not present

## 2021-01-18 DIAGNOSIS — M21622 Bunionette of left foot: Secondary | ICD-10-CM | POA: Diagnosis not present

## 2021-01-19 DIAGNOSIS — N5201 Erectile dysfunction due to arterial insufficiency: Secondary | ICD-10-CM | POA: Diagnosis not present

## 2021-01-19 DIAGNOSIS — Z8546 Personal history of malignant neoplasm of prostate: Secondary | ICD-10-CM | POA: Diagnosis not present

## 2021-01-19 DIAGNOSIS — N210 Calculus in bladder: Secondary | ICD-10-CM | POA: Diagnosis not present

## 2021-01-19 DIAGNOSIS — N401 Enlarged prostate with lower urinary tract symptoms: Secondary | ICD-10-CM | POA: Diagnosis not present

## 2021-01-19 DIAGNOSIS — R351 Nocturia: Secondary | ICD-10-CM | POA: Diagnosis not present

## 2021-01-23 ENCOUNTER — Ambulatory Visit: Payer: PPO | Admitting: Neurology

## 2021-01-25 DIAGNOSIS — Z8546 Personal history of malignant neoplasm of prostate: Secondary | ICD-10-CM | POA: Diagnosis not present

## 2021-01-29 DIAGNOSIS — Z961 Presence of intraocular lens: Secondary | ICD-10-CM | POA: Diagnosis not present

## 2021-01-29 DIAGNOSIS — H524 Presbyopia: Secondary | ICD-10-CM | POA: Diagnosis not present

## 2021-01-29 DIAGNOSIS — H40013 Open angle with borderline findings, low risk, bilateral: Secondary | ICD-10-CM | POA: Diagnosis not present

## 2021-02-01 ENCOUNTER — Encounter: Payer: Self-pay | Admitting: Neurology

## 2021-02-01 ENCOUNTER — Ambulatory Visit: Payer: PPO | Admitting: Neurology

## 2021-02-01 ENCOUNTER — Other Ambulatory Visit: Payer: Self-pay

## 2021-02-01 VITALS — BP 108/72 | HR 48 | Ht 72.0 in | Wt 180.0 lb

## 2021-02-01 DIAGNOSIS — R519 Headache, unspecified: Secondary | ICD-10-CM | POA: Diagnosis not present

## 2021-02-01 MED ORDER — TOPIRAMATE 25 MG PO TABS
50.0000 mg | ORAL_TABLET | Freq: Every day | ORAL | 3 refills | Status: DC
Start: 2021-02-01 — End: 2021-07-18

## 2021-02-01 MED ORDER — VENLAFAXINE HCL ER 37.5 MG PO CP24: ORAL_CAPSULE | ORAL | 1 refills | Status: DC

## 2021-02-01 NOTE — Patient Instructions (Signed)
We will start Effexor for headache prevention. Start 37.5 mg a day for 2 weeks, then go to 75 mg a day. At that point, reduce the Topamax to 25 mg at night for 3 weeks, then stop. If the headaches return, call our office.

## 2021-02-01 NOTE — Progress Notes (Signed)
Reason for visit: Migraine headache  Dylan Woods is an 75 y.o. male  History of present illness:  Dylan Woods is a 75 year old right-handed white male with a history of migraine headaches.  He has done quite well on Topamax, he has reported some cognitive difficulties on the medication.  The patient had the diagnosis of prostate cancer in 2018, he developed osteoporosis with the subsequent treatment for this, he is now on Fosamax.  He was concerned that the Topamax may be contributing to this and wishes to come off the medication.  He is not having any significant headaches on the Topamax, occasionally will have a mild headache and will take Tylenol for it.  He returns here for further evaluation.  He has now retired.  Past Medical History:  Diagnosis Date   Anemia    Bladder outlet obstruction    Degenerative arthritis    Diverticulosis    Foley catheter in place    Heart murmur    History of chest pain    per pt and epic documentation -- cardiac work-up done --- per dr Marlou Porch note felt to not be cardiac related   History of radiation therapy    boost w/ radioactive prostate seed implants, 110Gy; then IMRT  45Gy in 25 fractions , 01-20-2017 to 02-24-2017   History of urinary retention 12/23/2016   post surgery 12-18-2016   Hyperplasia of prostate with lower urinary tract symptoms (LUTS)    Intermittent palpitations    mostly at night--  cardiologist visit w/ dr Marlou Porch 08-07-2017 in epic, ekg showed PACs   Internal hemorrhoids    Irritable bowel syndrome with diarrhea    Migraines    neurologist-  dr [name]   Osteoporosis    Prostate cancer [facility] urologist-  dr dahlstedt/  oncologist-  dr [name]   dx 03-29-2016-- Stage T1c, Gleason 4+5, PSA 5.06, vol 74.51cc--- Hormone therapy (lupron injection) prostate decreased to 47cc; 12-18-2016  radiactive seed implants;  completed external beam radiation 02-24-2017 PTNS injections.     Past Surgical History:  Procedure Laterality  Date   CARDIOVASCULAR STRESS TEST  06-19-2016   dr Marlou Porch   Intermediate risk nuclear study w/ no reversible ischemia with moderate size and intensity fixed inferoseptal and septal perfusion defect, consider artifact scar/  LVEF nuclear stress 48% (45-54%)  with septal hypokinesis to dyskinesis   CATARACT EXTRACTION W/ INTRAOCULAR LENS  IMPLANT, BILATERAL  2011;  2004   CIRCUMCISION  age 73   CYSTOSCOPY N/A 12/18/2016   Procedure: CYSTOSCOPY FLEXIBLE;  Surgeon: [name], MD;  Location: [facility];  Service: Urology;  Laterality: N/A;  NO SEEDS FOUND IN BLADDER   CYSTOSCOPY N/A 02/11/2020   Procedure: CYSTOSCOPY bladder stone extraction placement of foley catheter;  Surgeon: Ceasar Mons, MD;  Location: [facility];  Service: Urology;  Laterality: N/A;   CYSTOSCOPY WITH INSERTION OF UROLIFT  03/ 2019   dr [name]  w/ anesthesia @ [facility]   GOLD SEED IMPLANT N/A 12/18/2016   Procedure: GOLD SEED IMPLANT;  Surgeon: [name], MD;  Location: [facility];  Service: Urology;  Laterality: N/A;   3 MARKERS  IMPLANTED   INGUINAL HERNIA REPAIR Right 1992   KNEE ARTHROSCOPY Left 2003;  2008   NASAL SINUS SURGERY  2015   PROSTATE BIOPSY  03-29-2016   dr Pilar Jarvis (office)   RADIOACTIVE SEED IMPLANT N/A 12/18/2016   Procedure: RADIOACTIVE SEED IMPLANT/BRACHYTHERAPY IMPLANT;  Surgeon: [name],  MD;  Location: [place];  Service: Urology;  Laterality: N/A;    62  SEEDS IMPLANTED   rt foot metatarsal Right    SPACE OAR INSTILLATION N/A 12/18/2016   Procedure: SPACE OAR INSTILLATION;  Surgeon: [name], MD;  Location: [facility];  Service: Urology;  Laterality: N/A;   TRANSURETHRAL RESECTION OF PROSTATE N/A 10/31/2017   Procedure: TRANSURETHRAL RESECTION OF THE PROSTATE (TURP);  Surgeon: [name], MD;  Location: [facility];  Service: Urology;  Laterality: N/A;     Family History  Problem Relation Age of Onset   Parkinsonism Father    Colon polyps Father    Cancer Mother        lymphoma   Colon polyps Mother    Cancer Brother        prostate ca s/p prostatectomy    Social history:  reports that he quit smoking about 51 years ago. His smoking use included cigarettes. He has never used smokeless tobacco. He reports that he does not currently use alcohol. He reports that he does not use drugs.    Allergies  Allergen Reactions   Ampicillin Rash    Has patient had a PCN reaction causing immediate rash, facial/tongue/throat swelling, SOB or lightheadedness with hypotension: Yes Has patient had a PCN reaction causing severe rash involving mucus membranes or skin necrosis: No Has patient had a PCN reaction that required hospitalization: No Has patient had a PCN reaction occurring within the last 10 years: No If all of the above answers are "NO", then may proceed with Cephalosporin use.    Ketorolac Other (See Comments)    "Makes him feel like he is crazy"   Betadine [Povidone Iodine] Other (See Comments)    " irritates skin, burns and stings"    Medications:  Prior to Admission medications   Medication Sig Start Date End Date Taking? Authorizing Provider  acetaminophen (TYLENOL) 500 MG tablet Take 500 mg by mouth every 6 (six) hours as needed for moderate pain.     [provider]  acetaminophen-codeine (TYLENOL #3) 300-30 MG tablet Take 1 tablet by mouth daily as needed for severe pain.  04/02/19   [provider]  alendronate (FOSAMAX) 70 MG tablet 1 tablet once a week. Saturday 07/15/18   [provider]  ALPRAZolam Duanne Moron) 0.25 MG tablet Take 0.25 mg by mouth daily as needed for anxiety or sleep.     [provider]  Ascorbic Acid (SM CHEWABLE VITAMIN C) 500 MG CHEW Chew 1,000 mg by mouth in the morning and at bedtime.     [provider]  Avanafil (STENDRA) 200 MG TABS Take 200 mg by mouth  daily as needed (erectile dysfunction).    [provider]  buPROPion (WELLBUTRIN SR) 100 MG 12 hr tablet TAKE 1 TABLET(100 MG) BY MOUTH DAILY 04/24/20   Kathrynn Ducking, MD  CALCIUM-VITAMIN D PO Take 1 tablet by mouth 2 (two) times daily. 600-400mg  tablet     [provider]  desmopressin (DDAVP) 0.2 MG tablet Take 0.4 mg by mouth every evening.  05/28/18   [provider]  Doxylamine Succinate, Sleep, (UNISOM PO) Take 1 tablet by mouth daily. Take on tablet night for sleep     [provider]  Ferrous Sulfate (IRON SUPPLEMENT PO) Take 2 tablets by mouth 2 (two) times daily.     [provider]  ketoconazole (NIZORAL) 2 % cream Apply 1 application topically daily as needed  for irritation.  05/06/19   [provider]  Multiple Vitamin (MULTI-VITAMINS) TABS Take 1 tablet by mouth in the morning and at bedtime. Centrum Silver for Men 50+    [provider]  oxybutynin (DITROPAN) 5 MG tablet Take 5 mg by mouth in the morning and at bedtime.     [provider]  Propylene Glycol (SYSTANE COMPLETE OP) Place 1 drop into both eyes daily as needed (dry eye).    [provider]  silodosin (RAPAFLO) 4 MG CAPS capsule Take 4 mg by mouth daily. 01/27/20   [provider]  topiramate (TOPAMAX) 25 MG tablet Take 3 tablets (75 mg total) by mouth at bedtime. 07/17/20   Suzzanne Cloud, NP  traMADol (ULTRAM) 50 MG tablet Take 1-2 tablets (50-100 mg total) by mouth every 6 (six) hours as needed for moderate pain or severe pain. Post-operatively Patient not taking: Reported on 02/11/2020 11/01/17   Alexis Frock, MD  valACYclovir (VALTREX) 1000 MG tablet Take 1,000 mg by mouth daily as needed (for cold sores).  11/13/15   [provider]    ROS:  Out of a complete 14 system review of symptoms, the patient complains only of the following symptoms, and all other reviewed systems are negative.  Headache Arthritis  Blood  pressure 108/72, pulse (!) 48, height 6' (1.829 m), weight 180 lb (81.6 kg), SpO2 98 %.  Physical Exam  General: The patient is alert and cooperative at the time of the examination.  Skin: No significant peripheral edema is noted.   Neurologic Exam  Mental status: The patient is alert and oriented x 3 at the time of the examination. The patient has apparent normal recent and remote memory, with an apparently normal attention span and concentration ability.   Cranial nerves: Facial symmetry is present. Speech is normal, no aphasia or dysarthria is noted. Extraocular movements are full. Visual fields are full.  Motor: The patient has good strength in all 4 extremities.  Sensory examination: Soft touch sensation is symmetric on the face, arms, and legs.  Coordination: The patient has good finger-nose-finger and heel-to-shin bilaterally.  Gait and station: The patient has a normal gait. Tandem gait is slightly unsteady. Romberg is negative. No drift is seen.  Reflexes: Deep tendon reflexes are symmetric.   Assessment/Plan:  1.  Migraine headache  The patient is doing well with the Topamax, but he wishes to come off the medication for fears of this is having some impact on osteoporosis.  The patient will be started on Effexor at 37.5 mg daily for 2 weeks and then go to 75 mg daily.  At that point, he will reduce the Topamax to 25 mg at night for 3 weeks and then stop the drug.  If the headaches return, we will have him contact our office and consider going up on the dose of Effexor.  In the future, other medications such as Aimovig or Ajovy can be used if the Effexor is not helpful.  The patient will follow-up in 4 months, he can be followed through Dr. Billey Gosling.  Jill Alexanders MD 02/01/2021 1:53 PM  [facility] [address] [place], [place] [address]  Phone [phone] Fax [phone]

## 2021-02-05 ENCOUNTER — Telehealth: Payer: Self-pay | Admitting: Neurology

## 2021-02-05 NOTE — Telephone Encounter (Signed)
I called the pt. He reports he has thought on the Effexor start and would like to discuss another medication. Reports he is hesitant to start this drug because of the class of med and would like to discuss with Dr. [name] once he is available.

## 2021-02-05 NOTE — Telephone Encounter (Signed)
Pt called has some questions about his venlafaxine XR (EFFEXOR XR) 37.5 MG 24 hr capsule. Pt requesting a call back.

## 2021-02-05 NOTE — Telephone Encounter (Signed)
I called the patient, left a message, I will call back later. 

## 2021-02-06 NOTE — Telephone Encounter (Signed)
I called the patient a third time.  I left another message.  Apparently patient is hesitant to take the Effexor which can be used for migraine.  If he does not wish to go on the medication, we can use another medication such as Depakote for the headache, this has an FDA approved indication for migraine.  He will call and let me know what he wants to do.

## 2021-02-07 NOTE — Telephone Encounter (Signed)
Pt is asking for a call back from Dr [name] re: what he wants to do about the Effexor

## 2021-02-07 NOTE — Addendum Note (Signed)
Addended by: Kathrynn Ducking on: 02/07/2021 04:34 PM   Modules accepted: Orders

## 2021-02-07 NOTE — Telephone Encounter (Signed)
I called the patient.  He does not wish to go on Effexor, he is concerned about potential side effects.  Basically, what he wants to do is to taper off the Topamax although in the past when he did this his headaches worsen.  We will go ahead and have him come down on Topamax to 25 mg at night for a month and if he does well, we will stop the drug.  If the headaches recur, we may consider another medication such as Aimovig or Ajovy.  He does not wish to go on Depakote due to potential for weight gain.

## 2021-02-20 DIAGNOSIS — Z87898 Personal history of other specified conditions: Secondary | ICD-10-CM

## 2021-02-20 HISTORY — DX: Personal history of other specified conditions: Z87.898

## 2021-02-28 DIAGNOSIS — R32 Unspecified urinary incontinence: Secondary | ICD-10-CM | POA: Diagnosis not present

## 2021-03-01 DIAGNOSIS — N3941 Urge incontinence: Secondary | ICD-10-CM | POA: Diagnosis not present

## 2021-03-01 DIAGNOSIS — N210 Calculus in bladder: Secondary | ICD-10-CM | POA: Diagnosis not present

## 2021-03-12 DIAGNOSIS — H4042X0 Glaucoma secondary to eye inflammation, left eye, stage unspecified: Secondary | ICD-10-CM | POA: Diagnosis not present

## 2021-03-14 ENCOUNTER — Other Ambulatory Visit (HOSPITAL_COMMUNITY): Payer: Self-pay | Admitting: Internal Medicine

## 2021-03-14 ENCOUNTER — Other Ambulatory Visit: Payer: Self-pay | Admitting: Internal Medicine

## 2021-03-14 ENCOUNTER — Encounter (HOSPITAL_BASED_OUTPATIENT_CLINIC_OR_DEPARTMENT_OTHER): Payer: Self-pay

## 2021-03-14 ENCOUNTER — Ambulatory Visit (HOSPITAL_BASED_OUTPATIENT_CLINIC_OR_DEPARTMENT_OTHER)
Admission: RE | Admit: 2021-03-14 | Discharge: 2021-03-14 | Disposition: A | Discharge status: Home / Self Care | Payer: PPO | Source: Ambulatory Visit | Attending: Internal Medicine | Admitting: Internal Medicine

## 2021-03-14 ENCOUNTER — Other Ambulatory Visit: Payer: Self-pay

## 2021-03-14 DIAGNOSIS — I6523 Occlusion and stenosis of bilateral carotid arteries: Secondary | ICD-10-CM | POA: Diagnosis not present

## 2021-03-14 DIAGNOSIS — R002 Palpitations: Secondary | ICD-10-CM | POA: Diagnosis not present

## 2021-03-14 DIAGNOSIS — R202 Paresthesia of skin: Secondary | ICD-10-CM | POA: Diagnosis not present

## 2021-03-14 DIAGNOSIS — R519 Headache, unspecified: Secondary | ICD-10-CM | POA: Insufficient documentation

## 2021-03-14 DIAGNOSIS — R42 Dizziness and giddiness: Secondary | ICD-10-CM

## 2021-03-14 DIAGNOSIS — R0789 Other chest pain: Secondary | ICD-10-CM | POA: Diagnosis not present

## 2021-03-14 DIAGNOSIS — E041 Nontoxic single thyroid nodule: Secondary | ICD-10-CM | POA: Diagnosis not present

## 2021-03-14 DIAGNOSIS — E871 Hypo-osmolality and hyponatremia: Secondary | ICD-10-CM | POA: Diagnosis not present

## 2021-03-14 DIAGNOSIS — G44209 Tension-type headache, unspecified, not intractable: Secondary | ICD-10-CM | POA: Diagnosis not present

## 2021-03-14 MED ORDER — IOHEXOL 350 MG/ML SOLN
75.0000 mL | Freq: Once | INTRAVENOUS | Status: AC | PRN
  Administered 2021-03-14: 75 mL via INTRAVENOUS

## 2021-03-16 DIAGNOSIS — R200 Anesthesia of skin: Secondary | ICD-10-CM | POA: Diagnosis not present

## 2021-03-16 DIAGNOSIS — R202 Paresthesia of skin: Secondary | ICD-10-CM | POA: Diagnosis not present

## 2021-03-16 DIAGNOSIS — E041 Nontoxic single thyroid nodule: Secondary | ICD-10-CM | POA: Diagnosis not present

## 2021-03-17 DIAGNOSIS — R202 Paresthesia of skin: Secondary | ICD-10-CM | POA: Diagnosis not present

## 2021-03-17 DIAGNOSIS — I6523 Occlusion and stenosis of bilateral carotid arteries: Secondary | ICD-10-CM | POA: Diagnosis not present

## 2021-03-17 DIAGNOSIS — R519 Headache, unspecified: Secondary | ICD-10-CM | POA: Diagnosis not present

## 2021-03-17 DIAGNOSIS — M47812 Spondylosis without myelopathy or radiculopathy, cervical region: Secondary | ICD-10-CM | POA: Diagnosis not present

## 2021-03-17 DIAGNOSIS — R200 Anesthesia of skin: Secondary | ICD-10-CM | POA: Diagnosis not present

## 2021-03-17 DIAGNOSIS — R0602 Shortness of breath: Secondary | ICD-10-CM | POA: Diagnosis not present

## 2021-03-17 DIAGNOSIS — R29700 NIHSS score 0: Secondary | ICD-10-CM | POA: Diagnosis not present

## 2021-03-17 DIAGNOSIS — Z8546 Personal history of malignant neoplasm of prostate: Secondary | ICD-10-CM | POA: Diagnosis not present

## 2021-03-17 DIAGNOSIS — M5412 Radiculopathy, cervical region: Secondary | ICD-10-CM | POA: Diagnosis not present

## 2021-03-17 DIAGNOSIS — I471 Supraventricular tachycardia: Secondary | ICD-10-CM | POA: Diagnosis not present

## 2021-03-17 DIAGNOSIS — R739 Hyperglycemia, unspecified: Secondary | ICD-10-CM | POA: Diagnosis not present

## 2021-03-19 DIAGNOSIS — H4042X0 Glaucoma secondary to eye inflammation, left eye, stage unspecified: Secondary | ICD-10-CM | POA: Diagnosis not present

## 2021-03-20 ENCOUNTER — Other Ambulatory Visit: Payer: Self-pay | Admitting: Registered Nurse

## 2021-03-20 DIAGNOSIS — E041 Nontoxic single thyroid nodule: Secondary | ICD-10-CM

## 2021-03-22 DIAGNOSIS — R002 Palpitations: Secondary | ICD-10-CM | POA: Diagnosis not present

## 2021-03-22 DIAGNOSIS — I491 Atrial premature depolarization: Secondary | ICD-10-CM | POA: Diagnosis not present

## 2021-03-22 DIAGNOSIS — R202 Paresthesia of skin: Secondary | ICD-10-CM | POA: Diagnosis not present

## 2021-03-22 DIAGNOSIS — R0789 Other chest pain: Secondary | ICD-10-CM | POA: Diagnosis not present

## 2021-03-22 DIAGNOSIS — G44209 Tension-type headache, unspecified, not intractable: Secondary | ICD-10-CM | POA: Diagnosis not present

## 2021-03-22 DIAGNOSIS — I6529 Occlusion and stenosis of unspecified carotid artery: Secondary | ICD-10-CM | POA: Diagnosis not present

## 2021-03-22 DIAGNOSIS — E041 Nontoxic single thyroid nodule: Secondary | ICD-10-CM | POA: Diagnosis not present

## 2021-03-22 DIAGNOSIS — E871 Hypo-osmolality and hyponatremia: Secondary | ICD-10-CM | POA: Diagnosis not present

## 2021-03-28 ENCOUNTER — Ambulatory Visit
Admission: RE | Admit: 2021-03-28 | Discharge: 2021-03-28 | Disposition: A | Discharge status: Home / Self Care | Payer: PPO | Source: Ambulatory Visit | Attending: Registered Nurse | Admitting: Registered Nurse

## 2021-03-28 DIAGNOSIS — E041 Nontoxic single thyroid nodule: Secondary | ICD-10-CM | POA: Diagnosis not present

## 2021-03-29 ENCOUNTER — Other Ambulatory Visit (HOSPITAL_COMMUNITY): Payer: Self-pay | Admitting: Internal Medicine

## 2021-03-29 ENCOUNTER — Other Ambulatory Visit: Payer: Self-pay

## 2021-03-29 ENCOUNTER — Ambulatory Visit (HOSPITAL_COMMUNITY)
Admission: RE | Admit: 2021-03-29 | Discharge: 2021-03-29 | Disposition: A | Discharge status: Home / Self Care | Payer: PPO | Source: Ambulatory Visit | Attending: Internal Medicine | Admitting: Internal Medicine

## 2021-03-29 DIAGNOSIS — I6529 Occlusion and stenosis of unspecified carotid artery: Secondary | ICD-10-CM | POA: Insufficient documentation

## 2021-03-29 NOTE — Progress Notes (Signed)
CC:  headaches  Follow-up Visit  Last visit: 02/01/21  Brief HPI: 75 year old male with a history of prostate cancer who follows in clinic for migraines. At his last visit he was tapered off of Topamax due to patient concerns it may be contributing to osteoporosis.  Interval History: He has weaned off of Topamax and has been off of this for the past month. Was prescribed Effexor but has not had any more migraines so has not picked it up. Does have rare lower level headaches every 2-3 weeks which are not as bothersome to him.  On Thanksgiving he woke up with numbness and tingling in his left arm. This lasted for ~30 minutes to one hour then subsided. No associated headache. Later that night he developed numbness and tingling in his left leg. Went to his PCP who ordered a CTH was negative. CTA head/neck did not show occlusion or flow limiting stenosis. 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 CT did incidentally show multiple thyroid nodules which are being worked up by his PCP.   Had another episode of numbness/tingling after Thanksgiving so he went to the ED. Thinks he had an MRI there but is not sure. Was told he had an irregular heartbeat and was started on diltiazem.  Carotid US 03/26/21 showed 1-39% stenosis on the left, no evidence of stenosis on the right.  He is currently taking ASA 325 and crestor 5 mg every other day (unable to tolerate daily). LDL in September 2022 was 94.  He has reported tingling in his left hand as far back as 2018.  Prior Therapies                                  Topamax 50 mg QHS  Physical Exam:   Vital Signs: BP 126/70   Pulse (!) 57   Ht 6' (1.829 m)   Wt 187 lb (84.8 kg)   BMI 25.36 kg/m  GENERAL:  well appearing, in no acute distress, alert  SKIN:  Color, texture, turgor normal. No rashes or lesions HEAD:  Normocephalic/atraumatic. RESP: normal respiratory effort MSK:  No gross joint deformities.    NEUROLOGICAL: Mental Status: Alert, oriented to person, place and time, Follows commands, and Speech fluent and appropriate. Cranial Nerves: PERRL, face symmetric, no dysarthria, hearing grossly intact Motor: moves all extremities equally Gait: normal-based.  IMPRESSION: 75 year old male with a history of prostate cancer and migraines who presents for follow up. He had 3 episodes of numbness/tingling on his left arm and leg which were not associated with headaches. Unfortunately I am not able to access the records from his prior ED visit. He will bring the MRI discs to the office for review. Will order A1c and TTE to complete stroke workup. Discussed Cardiology evaluation and heart monitor to evaluate his irregular heartbeat, but patient would prefer to have PCP order this. Patient is concerned there may be a pinched nerve in his neck. Discussed that this may contribute to left upper extremity symptoms, however it is less likely to cause numbness/paresthesias in his leg unless he also has concurrent lumbar stenosis. Would focus on ruling out stroke risk factors first, then can consider further workup for peripheral causes.  PLAN: -Continue ASA 81, crestor 5 mg every other day (offered alternative statin but patient prefers to stay on current regimen at this time) -A1c level -TTE -He will bring  MRI discs to the office for evaluation -Recommend Cardiology evaluation for irregular heartbeat, he would prefer PCP manage this -next steps: consider MRI C-spine if not done during hospitalization  Follow-up: 6 months  I spent a total of 45 minutes on the date of the service. Discussed stroke risk factors and recommended workup. Written educational materials and patient instructions outlining all of the above were given.  Genia Harold, MD 04/03/21 12:00 PM

## 2021-04-02 DIAGNOSIS — H4042X0 Glaucoma secondary to eye inflammation, left eye, stage unspecified: Secondary | ICD-10-CM | POA: Diagnosis not present

## 2021-04-03 ENCOUNTER — Encounter: Payer: Self-pay | Admitting: Psychiatry

## 2021-04-03 ENCOUNTER — Ambulatory Visit: Payer: PPO | Admitting: Psychiatry

## 2021-04-03 VITALS — BP 126/70 | HR 57 | Ht 72.0 in | Wt 187.0 lb

## 2021-04-03 DIAGNOSIS — R202 Paresthesia of skin: Secondary | ICD-10-CM | POA: Diagnosis not present

## 2021-04-03 DIAGNOSIS — G459 Transient cerebral ischemic attack, unspecified: Secondary | ICD-10-CM | POA: Diagnosis not present

## 2021-04-03 NOTE — Patient Instructions (Addendum)
Blood work to check blood sugar levels Ultrasound of heart Recommend Cardiology evaluation for irregular heartbeat Bring MRI discs to the office and I will evaluate them

## 2021-04-04 DIAGNOSIS — R339 Retention of urine, unspecified: Secondary | ICD-10-CM | POA: Diagnosis not present

## 2021-04-04 LAB — HEMOGLOBIN A1C
Est. average glucose Bld gHb Est-mCnc: 114 mg/dL
Hgb A1c MFr Bld: 5.6 % (ref 4.8–5.6)

## 2021-04-05 ENCOUNTER — Encounter: Payer: Self-pay | Admitting: Psychiatry

## 2021-04-18 ENCOUNTER — Telehealth: Payer: Self-pay | Admitting: Psychiatry

## 2021-04-18 ENCOUNTER — Other Ambulatory Visit: Payer: Self-pay | Admitting: Internal Medicine

## 2021-04-18 DIAGNOSIS — E041 Nontoxic single thyroid nodule: Secondary | ICD-10-CM

## 2021-04-18 NOTE — Telephone Encounter (Signed)
health team no Dylan Woods, sent Butch Penny a message she will reach out to the patient to schedule.

## 2021-04-19 DIAGNOSIS — R3915 Urgency of urination: Secondary | ICD-10-CM | POA: Diagnosis not present

## 2021-04-19 DIAGNOSIS — N401 Enlarged prostate with lower urinary tract symptoms: Secondary | ICD-10-CM | POA: Diagnosis not present

## 2021-04-27 DIAGNOSIS — R809 Proteinuria, unspecified: Secondary | ICD-10-CM | POA: Diagnosis not present

## 2021-04-27 DIAGNOSIS — C61 Malignant neoplasm of prostate: Secondary | ICD-10-CM | POA: Diagnosis not present

## 2021-05-08 ENCOUNTER — Other Ambulatory Visit: Payer: Self-pay | Admitting: Internal Medicine

## 2021-05-08 ENCOUNTER — Ambulatory Visit (INDEPENDENT_AMBULATORY_CARE_PROVIDER_SITE_OTHER): Payer: PPO

## 2021-05-08 DIAGNOSIS — R002 Palpitations: Secondary | ICD-10-CM

## 2021-05-08 DIAGNOSIS — R42 Dizziness and giddiness: Secondary | ICD-10-CM

## 2021-05-08 DIAGNOSIS — I491 Atrial premature depolarization: Secondary | ICD-10-CM

## 2021-05-08 NOTE — Progress Notes (Unsigned)
Enrolled for [employer] to mail a ZIO XT long term holter monitor to the patients address on file.  

## 2021-05-09 ENCOUNTER — Other Ambulatory Visit (HOSPITAL_COMMUNITY)
Admission: RE | Admit: 2021-05-09 | Discharge: 2021-05-09 | Disposition: A | Discharge status: Home / Self Care | Payer: PPO | Source: Ambulatory Visit | Attending: Internal Medicine | Admitting: Internal Medicine

## 2021-05-09 ENCOUNTER — Ambulatory Visit
Admission: RE | Admit: 2021-05-09 | Discharge: 2021-05-09 | Disposition: A | Discharge status: Home / Self Care | Payer: PPO | Source: Ambulatory Visit | Attending: Internal Medicine | Admitting: Internal Medicine

## 2021-05-09 DIAGNOSIS — E042 Nontoxic multinodular goiter: Secondary | ICD-10-CM | POA: Insufficient documentation

## 2021-05-09 DIAGNOSIS — E041 Nontoxic single thyroid nodule: Secondary | ICD-10-CM

## 2021-05-10 DIAGNOSIS — D225 Melanocytic nevi of trunk: Secondary | ICD-10-CM | POA: Diagnosis not present

## 2021-05-10 DIAGNOSIS — Z85828 Personal history of other malignant neoplasm of skin: Secondary | ICD-10-CM | POA: Diagnosis not present

## 2021-05-10 DIAGNOSIS — L570 Actinic keratosis: Secondary | ICD-10-CM | POA: Diagnosis not present

## 2021-05-10 DIAGNOSIS — L821 Other seborrheic keratosis: Secondary | ICD-10-CM | POA: Diagnosis not present

## 2021-05-10 DIAGNOSIS — C44319 Basal cell carcinoma of skin of other parts of face: Secondary | ICD-10-CM | POA: Diagnosis not present

## 2021-05-11 DIAGNOSIS — R002 Palpitations: Secondary | ICD-10-CM

## 2021-05-11 DIAGNOSIS — R42 Dizziness and giddiness: Secondary | ICD-10-CM

## 2021-05-11 DIAGNOSIS — I491 Atrial premature depolarization: Secondary | ICD-10-CM | POA: Diagnosis not present

## 2021-05-11 LAB — CYTOLOGY - NON PAP

## 2021-05-16 ENCOUNTER — Encounter: Payer: Self-pay | Admitting: Internal Medicine

## 2021-05-21 DIAGNOSIS — Z8546 Personal history of malignant neoplasm of prostate: Secondary | ICD-10-CM | POA: Diagnosis not present

## 2021-05-21 DIAGNOSIS — R350 Frequency of micturition: Secondary | ICD-10-CM | POA: Diagnosis not present

## 2021-05-21 DIAGNOSIS — R351 Nocturia: Secondary | ICD-10-CM | POA: Diagnosis not present

## 2021-05-21 DIAGNOSIS — N5201 Erectile dysfunction due to arterial insufficiency: Secondary | ICD-10-CM | POA: Diagnosis not present

## 2021-05-21 DIAGNOSIS — R3915 Urgency of urination: Secondary | ICD-10-CM | POA: Diagnosis not present

## 2021-05-30 ENCOUNTER — Telehealth: Payer: Self-pay | Admitting: *Deleted

## 2021-05-30 NOTE — Telephone Encounter (Signed)
Dr Hilarie Fredrickson- Pt is scheduled for recall colon for family hx. Of colon polyps. Pt is 75, no recall assessment sheet noted in chart. Please review, pt is being seen for palpitations, dizziness and PACs.  Has echo scheduled for 2/14 and has a ZIO monitor ordered. OK to proceed with recall colon on 3/9?  Please advise, thank you! Lattie Haw PV

## 2021-05-30 NOTE — Telephone Encounter (Signed)
Lattie Haw Thanks for the note. I think we should wait until he completes his cardiology evaluation including ECHO and ZIO monitoring. Would delay colonoscopy until this concludes. I will CC: Dottie who can help followup on this cards eval. Thanks JMP

## 2021-05-30 NOTE — Telephone Encounter (Signed)
Spoke with pt per phone. Informed pt we will need cardiac clearance prior to procedure. Let him know we will cancel scheduled PV and colon for now. Instructed him to call back once Cardiology clears him.

## 2021-05-31 DIAGNOSIS — R002 Palpitations: Secondary | ICD-10-CM | POA: Diagnosis not present

## 2021-05-31 DIAGNOSIS — I491 Atrial premature depolarization: Secondary | ICD-10-CM | POA: Diagnosis not present

## 2021-05-31 DIAGNOSIS — R42 Dizziness and giddiness: Secondary | ICD-10-CM | POA: Diagnosis not present

## 2021-06-05 ENCOUNTER — Ambulatory Visit (HOSPITAL_COMMUNITY)
Admission: RE | Admit: 2021-06-05 | Discharge: 2021-06-05 | Disposition: A | Discharge status: Home / Self Care | Payer: PPO | Source: Ambulatory Visit | Attending: Psychiatry | Admitting: Psychiatry

## 2021-06-05 ENCOUNTER — Other Ambulatory Visit: Payer: Self-pay

## 2021-06-05 DIAGNOSIS — I358 Other nonrheumatic aortic valve disorders: Secondary | ICD-10-CM | POA: Insufficient documentation

## 2021-06-05 DIAGNOSIS — G459 Transient cerebral ischemic attack, unspecified: Secondary | ICD-10-CM | POA: Insufficient documentation

## 2021-06-05 DIAGNOSIS — I351 Nonrheumatic aortic (valve) insufficiency: Secondary | ICD-10-CM | POA: Insufficient documentation

## 2021-06-05 DIAGNOSIS — I700 Atherosclerosis of aorta: Secondary | ICD-10-CM | POA: Insufficient documentation

## 2021-06-05 LAB — ECHOCARDIOGRAM COMPLETE BUBBLE STUDY
AR max vel: 2 cm2
AV Peak grad: 8.3 mmHg
Ao pk vel: 1.44 m/s
Area-P 1/2: 4.99 cm2
Calc EF: 53.4 %
S' Lateral: 3.5 cm
Single Plane A2C EF: 55.5 %
Single Plane A4C EF: 52 %

## 2021-06-07 ENCOUNTER — Ambulatory Visit: Payer: PPO | Admitting: Psychiatry

## 2021-06-08 NOTE — Telephone Encounter (Signed)
Left message for the pt to call the office to schedule a NEW PT APPT as we have not seen the since 11/2017. Pt can be set up with who may be able to see him first as a NEW PT since it has been over 3 yrs. APPt can be at [facility], [facility] or [facility] location.   I will update the requesting office the is going to need a NEW PT Appt for pre op clearance.

## 2021-06-08 NOTE — Telephone Encounter (Signed)
° °  Name: Dylan Woods  DOB: 04-Nov-1945  MRN: [id number]  Primary Cardiologist: None  Chart reviewed as part of pre-operative protocol coverage. Because of [name]'s past medical history and time since last visit, he will require a follow-up visit in order to better assess preoperative cardiovascular risk.  Pre-op covering staff: - Please schedule appointment and call patient to inform them. If patient already had an upcoming appointment within acceptable timeframe, please add "pre-op clearance" to the appointment notes so provider is aware. - Please contact requesting surgeon's office via preferred method (i.e, phone, fax) to inform them of need for appointment prior to surgery.  If applicable, this message will also be routed to pharmacy pool and/or primary cardiologist for input on holding anticoagulant/antiplatelet agent as requested below so that this information is available to the clearing provider at time of patient's appointment.   Christell Faith, [name]  06/08/2021, 9:03 AM

## 2021-06-08 NOTE — Telephone Encounter (Signed)
Request for surgical clearance:     Endoscopy Procedure  What type of surgery is being performed?     colonoscopy  When is this surgery scheduled?     TBD  What type of clearance is required ?   MEDICAL  Are there any medications that need to be held prior to surgery and how long? no  Practice name and name of physician performing surgery?      [facility]  What is your office phone and fax number?      Phone- [phone]  Fax219 [phone]  Anesthesia type (None, local, MAC, general) ?       MAC

## 2021-06-11 ENCOUNTER — Telehealth: Payer: Self-pay | Admitting: Cardiology

## 2021-06-11 ENCOUNTER — Ambulatory Visit: Payer: PPO | Admitting: Psychiatry

## 2021-06-11 NOTE — Telephone Encounter (Signed)
Patient is requesting a call back. He has been referred by PCP and scheduled for 3/16 with [name]. He would like to confirm that this appointment will also cover his clearance for Colonoscopy. He states he would like to confirm this with Arbie Cookey, CMA specifically as she contacted him regarding this matter. See 2/17 encounter.

## 2021-06-11 NOTE — Telephone Encounter (Signed)
Left message for the pt that the appt with [name] 07/05/21 is to re-establish as a NEW PT for pre op clearance. I left message as I cannot make a promise that he will be cleared on that day, this will be decided by [name]. MD will decide if further cardiac testing will be needed or if MD feels he can clear the pt after the appt. I will forward clearance request to [name] for upcoming appt . Will send FYI to requesting office the pt has a NEW PT APPT 07/05/21.    Larina Bras, [place]  Certified Medical Assistant Gastroenterology Telephone Encounter Signed Encounter Date:  05/30/2021   Signed                     Request for surgical clearance:     Endoscopy Procedure   What type of surgery is being performed?     colonoscopy   When is this surgery scheduled?     TBD   What type of clearance is required ?   MEDICAL   Are there any medications that need to be held prior to surgery and how long? no   Practice name and name of physician performing surgery?      [facility]   What is your office phone and fax number?      Phone- [phone]  Fax872 [phone]   Anesthesia type (None, local, MAC, general) ?       MAC

## 2021-06-12 ENCOUNTER — Encounter: Payer: Self-pay | Admitting: Psychiatry

## 2021-06-28 ENCOUNTER — Encounter: Payer: PPO | Admitting: Internal Medicine

## 2021-07-04 NOTE — H&P (View-Only) (Signed)
?Cardiology Office Note:   ? ?Date:  07/05/2021  ? ?ID:  Dylan Woods, DOB 20-May-1945, MRN [id number] ? ?PCP:  Shon Baton, MD  ?Cardiologist:  None   ? ?Referring MD: Shon Baton, MD  ? ?No chief complaint on file. ? ?History of Present Illness:   ? ?Dylan Woods is a 76 y.o. male here today for pre-operative clearance for colonoscopy at the request of Dr. Virgina Jock.  ? ?Echo 06/05/21 revealed LVEF 45-50%, global hypokinesis, and aortic valve sclerosis/calcification with no stenosis. There was also trivial mitral valve regurgitation and mild aortic valve regurgitation.  ? ?He has been seen in 2018 and 2019 for palpitations.  ? ?Today, he is doing well. He joined the [employer]. ? ?Prior to Thanksgiving, he felt numbness and tingling in his L arm and chest pain. The symptoms occurred again after Thanksgiving. He presented to the ED and was kept overnight for observation. Since then, he has been seen by neurology for the tingling sensation. He underwent further imaging and has been told he has general hypokinesis, especially in the L ventricle, and atrial tachycardia. ? ?He lives along and his family is out of time. His biggest barrier to treatment is finding someone to drive him home after an anaesthesia event. In 1992, he had a R inguinal hernia under Versed and did well.  ? 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 During that time, he would only smoke 1 to 2 cigarettes. He walks 2 to 3 miles daily and goes to the gym. His father had parkinson's but he does not believe his father had heart disease. ? ?He denies any shortness of breath, lightheadedness, headaches, syncope, orthopnea, PND, lower extremity edema or exertional symptoms. ? ?Past Medical History:  ?Diagnosis Date  ? Anemia   ? Bladder outlet obstruction   ? Degenerative arthritis   ? Diverticulosis   ? Foley catheter in place   ? Heart murmur   ? History of chest pain   ? per pt and epic  documentation -- cardiac work-up done --- per dr Marlou Porch note felt to not be cardiac related  ? History of radiation therapy   ? boost w/ radioactive prostate seed implants, 110Gy; then IMRT  45Gy in 25 fractions , 01-20-2017 to 02-24-2017  ? History of urinary retention 12/23/2016  ? post surgery 12-18-2016  ? Hyperplasia of prostate with lower urinary tract symptoms (LUTS)   ? Intermittent palpitations   ? mostly at night--  cardiologist visit w/ dr Marlou Porch 08-07-2017 in epic, ekg showed PACs  ? Internal hemorrhoids   ? Irritable bowel syndrome with diarrhea   ? Migraines   ? neurologist-  dr [name]  ? Osteoporosis   ? Prostate cancer [facility]) urologist-  dr dahlstedt/  oncologist-  dr [name]  ? dx 03-29-2016-- Stage T1c, Gleason 4+5, PSA 5.06, vol 74.51cc--- Hormone therapy (lupron injection) prostate decreased to 47cc; 12-18-2016  radiactive seed implants;  completed external beam radiation 02-24-2017 PTNS injections.   ? ? ?Past Surgical History:  ?Procedure Laterality Date  ? CARDIOVASCULAR STRESS TEST  06-19-2016   dr Marlou Porch  ? Intermediate risk nuclear study w/ no reversible ischemia with moderate size and intensity fixed inferoseptal and septal perfusion defect, consider artifact scar/  LVEF nuclear stress 48% (45-54%)  with septal hypokinesis to dyskinesis  ? CATARACT EXTRACTION W/ INTRAOCULAR LENS  IMPLANT, BILATERAL  2011;  2004  ? CIRCUMCISION  age 3  ?  CYSTOSCOPY N/A 12/18/2016  ? Procedure: CYSTOSCOPY FLEXIBLE;  Surgeon: [name], MD;  Location[facility];  Service: Urology;  Laterality: N/A;  NO SEEDS FOUND IN BLADDER  ? CYSTOSCOPY N/A 02/11/2020  ? Procedure: CYSTOSCOPY bladder stone extraction placement of foley catheter;  Surgeon: Ceasar Mons, MD;  Location: [facility];  Service: Urology;  Laterality: N/A;  ? CYSTOSCOPY WITH INSERTION OF UROLIFT  03/ 2019   dr [name]  w/ anesthesia @ [facility]  ? GOLD SEED IMPLANT N/A 12/18/2016  ? Procedure: GOLD SEED  IMPLANT;  Surgeon: [name], MD;  Location: [facility];  Service: Urology;  Laterality: N/A;   3 MARKERS  IMPLANTED  ? INGUINAL HERNIA REPAIR Right 1992  ? KNEE ARTHROSCOPY Left 2003;  2008  ? NASAL SINUS SURGERY  2015  ? PROSTATE BIOPSY  03-29-2016   dr Pilar Jarvis (office)  ? RADIOACTIVE SEED IMPLANT N/A 12/18/2016  ? Procedure: RADIOACTIVE SEED IMPLANT/BRACHYTHERAPY IMPLANT;  Surgeon: [name], MD;  Location: [facility];  Service: Urology;  Laterality: N/A;    62  SEEDS IMPLANTED  ? rt foot metatarsal Right   ? SPACE OAR INSTILLATION N/A 12/18/2016  ? Procedure: SPACE OAR INSTILLATION;  Surgeon: [name], MD;  Location: [facility]  Service: Urology;  Laterality: N/A;  ? TRANSURETHRAL RESECTION OF PROSTATE N/A 10/31/2017  ? Procedure: TRANSURETHRAL RESECTION OF THE PROSTATE (TURP);  Surgeon: [name], MD;  Location: [facility];  Service: Urology;  Laterality: N/A;  ? ? ?Current Medications: ?Current Meds  ?Medication Sig  ? acetaminophen (TYLENOL) 500 MG tablet Take 500 mg by mouth every 6 (six) hours as needed for moderate pain.   ? acetaminophen-codeine (TYLENOL #3) 300-30 MG tablet Take 1 tablet by mouth daily as needed for severe pain.   ? alendronate (FOSAMAX) 70 MG tablet 1 tablet once a week. Saturday  ? ALPRAZolam (XANAX) 0.25 MG tablet Take 0.25 mg by mouth daily as needed for anxiety or sleep.   ? Ascorbic Acid 500 MG CHEW Chew 1,000 mg by mouth in the morning and at bedtime.   ? ASPIRIN CHILDRENS 81 MG chewable tablet   ? CALCIUM-VITAMIN D PO Take 1 tablet by mouth 2 (two) times daily. 600-'400mg'$  tablet   ? desmopressin (DDAVP) 0.2 MG tablet Take 0.4 mg by mouth every evening.   ? diltiazem (CARDIZEM CD) 180 MG 24 hr capsule Take 180 mg by mouth daily.  ? Doxylamine Succinate, Sleep, (UNISOM PO) Take 1 tablet by mouth daily. Take on tablet night for sleep   ? Ferrous Sulfate (IRON SUPPLEMENT PO) Take 2  tablets by mouth 2 (two) times daily.   ? ketoconazole (NIZORAL) 2 % cream Apply 1 application topically daily as needed for irritation.   ? Multiple Vitamin (MULTI-VITAMINS) TABS Take 1 tablet by mouth in the morning and at bedtime. Centrum Silver for Men 50+  ? Propylene Glycol (SYSTANE COMPLETE OP) Place 1 drop into both eyes daily as needed (dry eye).  ? rosuvastatin (CRESTOR) 5 MG tablet Take 5 mg by mouth daily.  ? solifenacin (VESICARE) 5 MG tablet Take 5 mg by mouth daily.  ? valACYclovir (VALTREX) 1000 MG tablet Take 1,000 mg by mouth daily as needed (for cold sores).   ? Vibegron (GEMTESA PO) Take by mouth.  ?  ? ?Allergies:   Ampicillin, Ketorolac, and Betadine [povidone iodine]  ? ?Social History  ? ?Socioeconomic History  ? Marital status:  Widowed  ?  Spouse name: Not on file  ? Number of children: 1  ? Years of education: college  ? Highest education level: Not on file  ?Occupational History  ?  Employer: [employer]  ?  Comment: Lawyer  ?Tobacco Use  ? Smoking status: Former  ?  Years: 6.00  ?  Types: Cigarettes  ?  Quit date: 12/10/1969  ?  Years since quitting: 51.6  ? Smokeless tobacco: Never  ?Vaping Use  ? Vaping Use: Never used  ?Substance and Sexual Activity  ? Alcohol use: Not Currently  ? Drug use: No  ? Sexual activity: Not Currently  ?Other Topics Concern  ? Not on file  ?Social History Narrative  ? Patient lives at home alone widowed.  ? 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  ? Right handed.  ? College education.  ? Caffeine one cup daily coffee.  ? ?Social Determinants of Health  ? ?Financial Resource Strain: Not on file  ?Food Insecurity: Not on file  ?Transportation Needs: Not on file  ?Physical Activity: Not on file  ?Stress: Not on file  ?Social Connections: Not on file  ?  ? ?Family History: ?The patient's family history includes Cancer in his brother and mother; Colon polyps in his father and mother; Parkinsonism in his father. ? ?ROS:    ?Please see the history of present illness.    ?(+) Tingling (L arm) ?(+) Loss of sensation (L arm) ?(+) Chest pain ?All other systems reviewed and negative.  ? ?EKGs/Labs/Other Studies Reviewed:   ? ?The fol

## 2021-07-04 NOTE — Progress Notes (Signed)
?Cardiology Office Note:   ? ?Date:  07/05/2021  ? ?ID:  Dylan Woods, DOB [birth year], MRN [id number] ? ?PCP:  Shon Baton, MD  ?Cardiologist:  None   ? ?Referring MD: Shon Baton, MD  ? ?No chief complaint on file. ? ?History of Present Illness:   ? ?Dylan Woods is a 76 y.o. male here today for pre-operative clearance for colonoscopy at the request of Dr. Virgina Jock.  ? ?Echo 06/05/21 revealed LVEF 45-50%, global hypokinesis, and aortic valve sclerosis/calcification with no stenosis. There was also trivial mitral valve regurgitation and mild aortic valve regurgitation.  ? ?He has been seen in 2018 and 2019 for palpitations.  ? ?Today, he is doing well. He joined the [employer]. ? ?Prior to Thanksgiving, he felt numbness and tingling in his L arm and chest pain. The symptoms occurred again after Thanksgiving. He presented to the ED and was kept overnight for observation. Since then, he has been seen by neurology for the tingling sensation. He underwent further imaging and has been told he has general hypokinesis, especially in the L ventricle, and atrial tachycardia. ? ?He lives along and his family is out of time. His biggest barrier to treatment is finding someone to drive him home after an anaesthesia event. In 1992, he had a R inguinal hernia under Versed and did well.  ? 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 During that time, he would only smoke 1 to 2 cigarettes. He walks 2 to 3 miles daily and goes to the gym. His father had parkinson's but he does not believe his father had heart disease. ? ?He denies any shortness of breath, lightheadedness, headaches, syncope, orthopnea, PND, lower extremity edema or exertional symptoms. ? ?Past Medical History:  ?Diagnosis Date  ? Anemia   ? Bladder outlet obstruction   ? Degenerative arthritis   ? Diverticulosis   ? Foley catheter in place   ? Heart murmur   ? History of chest pain   ? per pt and epic  documentation -- cardiac work-up done --- per dr Marlou Porch note felt to not be cardiac related  ? History of radiation therapy   ? boost w/ radioactive prostate seed implants, 110Gy; then IMRT  45Gy in 25 fractions , 01-20-2017 to 02-24-2017  ? History of urinary retention 12/23/2016  ? post surgery 12-18-2016  ? Hyperplasia of prostate with lower urinary tract symptoms (LUTS)   ? Intermittent palpitations   ? mostly at night--  cardiologist visit w/ dr Marlou Porch 08-07-2017 in epic, ekg showed PACs  ? Internal hemorrhoids   ? Irritable bowel syndrome with diarrhea   ? Migraines   ? neurologist-  dr [name]  ? Osteoporosis   ? Prostate cancer [facility]) urologist-  dr dahlstedt/  oncologist-  dr [name]  ? dx 03-29-2016-- Stage T1c, Gleason 4+5, PSA 5.06, vol 74.51cc--- Hormone therapy (lupron injection) prostate decreased to 47cc; 12-18-2016  radiactive seed implants;  completed external beam radiation 02-24-2017 PTNS injections.   ? ? ?Past Surgical History:  ?Procedure Laterality Date  ? CARDIOVASCULAR STRESS TEST  06-19-2016   dr Marlou Porch  ? Intermediate risk nuclear study w/ no reversible ischemia with moderate size and intensity fixed inferoseptal and septal perfusion defect, consider artifact scar/  LVEF nuclear stress 48% (45-54%)  with septal hypokinesis to dyskinesis  ? CATARACT EXTRACTION W/ INTRAOCULAR LENS  IMPLANT, BILATERAL  2011;  2004  ? CIRCUMCISION  age 76  ?  CYSTOSCOPY N/A 12/18/2016  ? Procedure: CYSTOSCOPY FLEXIBLE;  Surgeon: [name], MD;  Location: [facility];  Service: Urology;  Laterality: N/A;  NO SEEDS FOUND IN BLADDER  ? CYSTOSCOPY N/A 02/11/2020  ? Procedure: CYSTOSCOPY bladder stone extraction placement of foley catheter;  Surgeon: Ceasar Mons, MD;  Location: [facility];  Service: Urology;  Laterality: N/A;  ? CYSTOSCOPY WITH INSERTION OF UROLIFT  03/ 2019   dr [name]  w/ anesthesia @ [facility]  ? GOLD SEED IMPLANT N/A 12/18/2016  ? Procedure: GOLD SEED  IMPLANT;  Surgeon: [name], MD;  Location: [facility];  Service: Urology;  Laterality: N/A;   3 MARKERS  IMPLANTED  ? INGUINAL HERNIA REPAIR Right 1992  ? KNEE ARTHROSCOPY Left 2003;  2008  ? NASAL SINUS SURGERY  2015  ? PROSTATE BIOPSY  03-29-2016   dr Pilar Jarvis (office)  ? RADIOACTIVE SEED IMPLANT N/A 12/18/2016  ? Procedure: RADIOACTIVE SEED IMPLANT/BRACHYTHERAPY IMPLANT;  Surgeon: [name], MD;  Location: [facility];  Service: Urology;  Laterality: N/A;    62  SEEDS IMPLANTED  ? rt foot metatarsal Right   ? SPACE OAR INSTILLATION N/A 12/18/2016  ? Procedure: SPACE OAR INSTILLATION;  Surgeon: [name], MD;  Location: [facility];  Service: Urology;  Laterality: N/A;  ? TRANSURETHRAL RESECTION OF PROSTATE N/A 10/31/2017  ? Procedure: TRANSURETHRAL RESECTION OF THE PROSTATE (TURP);  Surgeon: [name], MD;  Location: [facility];  Service: Urology;  Laterality: N/A;  ? ? ?Current Medications: ?Current Meds  ?Medication Sig  ? acetaminophen (TYLENOL) 500 MG tablet Take 500 mg by mouth every 6 (six) hours as needed for moderate pain.   ? acetaminophen-codeine (TYLENOL #3) 300-30 MG tablet Take 1 tablet by mouth daily as needed for severe pain.   ? alendronate (FOSAMAX) 70 MG tablet 1 tablet once a week. Saturday  ? ALPRAZolam (XANAX) 0.25 MG tablet Take 0.25 mg by mouth daily as needed for anxiety or sleep.   ? Ascorbic Acid 500 MG CHEW Chew 1,000 mg by mouth in the morning and at bedtime.   ? ASPIRIN CHILDRENS 81 MG chewable tablet   ? CALCIUM-VITAMIN D PO Take 1 tablet by mouth 2 (two) times daily. 600-'400mg'$  tablet   ? desmopressin (DDAVP) 0.2 MG tablet Take 0.4 mg by mouth every evening.   ? diltiazem (CARDIZEM CD) 180 MG 24 hr capsule Take 180 mg by mouth daily.  ? Doxylamine Succinate, Sleep, (UNISOM PO) Take 1 tablet by mouth daily. Take on tablet night for sleep   ? Ferrous Sulfate (IRON SUPPLEMENT PO) Take 2  tablets by mouth 2 (two) times daily.   ? ketoconazole (NIZORAL) 2 % cream Apply 1 application topically daily as needed for irritation.   ? Multiple Vitamin (MULTI-VITAMINS) TABS Take 1 tablet by mouth in the morning and at bedtime. Centrum Silver for Men 50+  ? Propylene Glycol (SYSTANE COMPLETE OP) Place 1 drop into both eyes daily as needed (dry eye).  ? rosuvastatin (CRESTOR) 5 MG tablet Take 5 mg by mouth daily.  ? solifenacin (VESICARE) 5 MG tablet Take 5 mg by mouth daily.  ? valACYclovir (VALTREX) 1000 MG tablet Take 1,000 mg by mouth daily as needed (for cold sores).   ? Vibegron (GEMTESA PO) Take by mouth.  ?  ? ?Allergies:   Ampicillin, Ketorolac, and Betadine [povidone iodine]  ? ?Social History  ? ?Socioeconomic History  ? Marital status:  Widowed  ?  Spouse name: Not on file  ? Number of children: 1  ? Years of education: college  ? Highest education level: Not on file  ?Occupational History  ?  Employer: [employer]  ?  Comment: Lawyer  ?Tobacco Use  ? Smoking status: Former  ?  Years: 6.00  ?  Types: Cigarettes  ?  Quit date: 12/10/1969  ?  Years since quitting: 51.6  ? Smokeless tobacco: Never  ?Vaping Use  ? Vaping Use: Never used  ?Substance and Sexual Activity  ? Alcohol use: Not Currently  ? Drug use: No  ? Sexual activity: Not Currently  ?Other Topics Concern  ? Not on file  ?Social History Narrative  ? Patient lives at home alone widowed.  ? 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  ? Right handed.  ? College education.  ? Caffeine one cup daily coffee.  ? ?Social Determinants of Health  ? ?Financial Resource Strain: Not on file  ?Food Insecurity: Not on file  ?Transportation Needs: Not on file  ?Physical Activity: Not on file  ?Stress: Not on file  ?Social Connections: Not on file  ?  ? ?Family History: ?The patient's family history includes Cancer in his brother and mother; Colon polyps in his father and mother; Parkinsonism in his father. ? ?ROS:    ?Please see the history of present illness.    ?(+) Tingling (L arm) ?(+) Loss of sensation (L arm) ?(+) Chest pain ?All other systems reviewed and negative.  ? ?EKGs/Labs/Other Studies Reviewed:   ? ?The fol

## 2021-07-05 ENCOUNTER — Other Ambulatory Visit: Payer: Self-pay

## 2021-07-05 ENCOUNTER — Encounter: Payer: Self-pay | Admitting: Cardiology

## 2021-07-05 ENCOUNTER — Ambulatory Visit: Payer: PPO | Admitting: Cardiology

## 2021-07-05 VITALS — BP 134/88 | HR 96 | Ht 72.0 in | Wt 189.0 lb

## 2021-07-05 DIAGNOSIS — R072 Precordial pain: Secondary | ICD-10-CM | POA: Diagnosis not present

## 2021-07-05 DIAGNOSIS — I491 Atrial premature depolarization: Secondary | ICD-10-CM | POA: Diagnosis not present

## 2021-07-05 DIAGNOSIS — Z01812 Encounter for preprocedural laboratory examination: Secondary | ICD-10-CM | POA: Diagnosis not present

## 2021-07-05 NOTE — Patient Instructions (Signed)
Medication Instructions:  ?The current medical regimen is effective;  continue present plan and medications. ? ?*If you need a refill on your cardiac medications before your next appointment, please call your pharmacy* ? ? ?Lab Work: ?To be scheduled (BMP,CBC) once left heart cath has been scheduled ? ?If you have labs (blood work) drawn today and your tests are completely normal, you will receive your results only by: ?MyChart Message (if you have MyChart) OR ?A paper copy in the mail ?If you have any lab test that is abnormal or we need to change your treatment, we will call you to review the results. ? ?You have been referred to Electrophysiology for the evaluation of frequent PACs (premature atrial contractions). ? ? ?Testing/Procedures: ? ? ?[facility] OFF[facility] ?[address], [address] ?[place] [address] ?Dept: [phone] ?Loc: [phone] ? ?Dylan Woods  07/05/2021 ? ?You are scheduled for a Cardiac Catheterization on  to be determined. ? ? ?1. Please arrive at the [facility] (Main Entrance A) at [facility]: [address] [place], [place] [address] at    TBD    (two hours before your procedure to ensure your preparation). Free valet parking service is available.  ? ?Special note: Every effort is made to have your procedure done on time. Please understand that emergencies sometimes delay scheduled procedures. ? ?2. Diet: Do not eat or drink anything after midnight prior to your procedure except sips of water to take medications. ? ?3. Labs:  TBD ? ?4. Medication instructions in preparation for your procedure: ? ?On the morning of your procedure, take your Aspirin and any morning medicines NOT listed above.  You may use sips of water. ? ?5. Plan for one night stay--bring personal belongings. ?6. Bring a current list of your medications and current insurance cards. ?7. You MUST have a responsible person to drive  you home. ?8. Someone MUST be with you the first 24 hours after you arrive home or your discharge will be delayed. ?9. Please wear clothes that are easy to get on and off and wear slip-on shoes. ? ?Thank you for allowing [place] to care for you! ?  -- [facility] ? ?Follow-Up: ?At [facility], you and your health needs are our priority.  As part of our continuing mission to provide you with exceptional heart care, we have created designated Provider Care Teams.  These Care Teams include your primary Cardiologist (physician) and Advanced Practice Providers (APPs -  Physician Assistants and Nurse Practitioners) who all work together to provide you with the care you need, when you need it. ? ?We recommend signing up for the patient portal called "MyChart".  Sign up information is provided on this After Visit Summary.  MyChart is used to connect with patients for Virtual Visits (Telemedicine).  Patients are able to view lab/test results, encounter notes, upcoming appointments, etc.  Non-urgent messages can be sent to your provider as well.   ?To learn more about what you can do with MyChart, go to [email or web address].   ? ?Your next appointment:   ?Follow up with Dr Marlou Porch will be determined at a later date. ? ?Thank you for choosing [facility]!! ? ? ? ?

## 2021-07-05 NOTE — Assessment & Plan Note (Signed)
Recent episode of left-sided chest discomfort with left arm tingling.  Echocardiogram demonstrated ejection fraction of 45 to 50% mildly reduced.  Previous nuclear stress test in 2018 showed a fixed inferoseptal perfusion defect.  EF was calculated at at 48% in 2018.  Given these findings, I would like to go ahead and proceed with a left heart catheterization.  Risks and benefits including stroke heart attack death renal impairment bleeding have been discussed.  He is willing to proceed.  Described the procedure.  We will hold off on colonoscopy at this point. ?

## 2021-07-05 NOTE — Assessment & Plan Note (Signed)
Frequent PACs noted on monitor.  Salvos noted on EKG today.  Given the frequency, I would like for him to sit down with electrophysiology.  He is currently on diltiazem 180 mg daily.  Perhaps a transition to antiarrhythmic may be beneficial. ?

## 2021-07-06 ENCOUNTER — Telehealth: Payer: Self-pay | Admitting: Cardiology

## 2021-07-06 NOTE — Telephone Encounter (Signed)
Pt scheduled for left heart cath 3/31 - to arrive at 7 am - procedure at 9 am. ?Lab ordered and scheduled for 3/29 here at our office. ? ?Pt is was of appt dates and times.  All questions answered at the time of the call.  He will call back if any questions/concern.  ? ? ?

## 2021-07-06 NOTE — Telephone Encounter (Signed)
Patient is requesting to speak with Jeannene Patella, RN to schedule procedure discussed during 3/16 appointment.  ?He also wanted to provide a few dates that he will be available on: ?3/29, 3/30, 3/31, 4/12, 4/17, 4/18, 4/19, 4/20, 4/21 ?

## 2021-07-13 DIAGNOSIS — H4042X0 Glaucoma secondary to eye inflammation, left eye, stage unspecified: Secondary | ICD-10-CM | POA: Diagnosis not present

## 2021-07-16 DIAGNOSIS — H4042X0 Glaucoma secondary to eye inflammation, left eye, stage unspecified: Secondary | ICD-10-CM | POA: Diagnosis not present

## 2021-07-18 ENCOUNTER — Other Ambulatory Visit: Payer: Self-pay

## 2021-07-18 ENCOUNTER — Other Ambulatory Visit: Payer: PPO | Admitting: *Deleted

## 2021-07-18 DIAGNOSIS — Z01812 Encounter for preprocedural laboratory examination: Secondary | ICD-10-CM

## 2021-07-18 DIAGNOSIS — R072 Precordial pain: Secondary | ICD-10-CM

## 2021-07-18 LAB — BASIC METABOLIC PANEL
BUN/Creatinine Ratio: 26 — ABNORMAL HIGH (ref 10–24)
BUN: 19 mg/dL (ref 8–27)
CO2: 29 mmol/L (ref 20–29)
Calcium: 9.8 mg/dL (ref 8.6–10.2)
Chloride: 94 mmol/L — ABNORMAL LOW (ref 96–106)
Creatinine, Ser: 0.74 mg/dL — ABNORMAL LOW (ref 0.76–1.27)
Glucose: 105 mg/dL — ABNORMAL HIGH (ref 70–99)
Potassium: 4.7 mmol/L (ref 3.5–5.2)
Sodium: 130 mmol/L — ABNORMAL LOW (ref 134–144)
eGFR: 93 mL/min/{1.73_m2} (ref >59)

## 2021-07-18 LAB — CBC
Hematocrit: 41.2 % (ref 37.5–51.0)
Hemoglobin: 14.1 g/dL (ref 13.0–17.7)
MCH: 30.8 pg (ref 26.6–33.0)
MCHC: 34.2 g/dL (ref 31.5–35.7)
MCV: 90 fL (ref 79–97)
Platelets: 200 10*3/uL (ref 150–450)
RBC: 4.58 x10E6/uL (ref 4.14–5.80)
RDW: 13.1 % (ref 11.6–15.4)
WBC: 4.7 10*3/uL (ref 3.4–10.8)

## 2021-07-19 ENCOUNTER — Telehealth: Payer: Self-pay | Admitting: *Deleted

## 2021-07-19 DIAGNOSIS — H4042X0 Glaucoma secondary to eye inflammation, left eye, stage unspecified: Secondary | ICD-10-CM | POA: Diagnosis not present

## 2021-07-19 NOTE — Telephone Encounter (Signed)
Left message for patient to call back to review procedure instructions 

## 2021-07-19 NOTE — Telephone Encounter (Signed)
Spoke with patient, no further questions. ?

## 2021-07-19 NOTE — Telephone Encounter (Signed)
Error

## 2021-07-19 NOTE — Telephone Encounter (Signed)
patient is returning phone call in regards to his procedure he having tomorrow ?

## 2021-07-19 NOTE — Telephone Encounter (Addendum)
Cardiac Catheterization scheduled at [facility] for: Friday July 20, 2021 9 AM ?Arrival time and place: [facility] at: 7 AM ? ? ?No solid food after midnight prior to cath, clear liquids until 5 AM day of procedure. ? ?Medication instructions: ?-Usual morning medications can be taken with sips of water including aspirin 81 mg. ? ?Confirmed patient has responsible adult to drive home post procedure and be with patient first 24 hours after arriving home. ? ?Reviewed procedure instructions with patient. ? ? ?

## 2021-07-20 ENCOUNTER — Other Ambulatory Visit: Payer: Self-pay

## 2021-07-20 ENCOUNTER — Ambulatory Visit (HOSPITAL_COMMUNITY)
Admission: RE | Admit: 2021-07-20 | Discharge: 2021-07-20 | Disposition: A | Discharge status: Home / Self Care | Payer: PPO | Attending: Cardiology | Admitting: Cardiology

## 2021-07-20 ENCOUNTER — Encounter (HOSPITAL_COMMUNITY)
Admission: RE | Disposition: A | Discharge status: Home / Self Care | Payer: Self-pay | Source: Home / Self Care | Attending: Cardiology

## 2021-07-20 DIAGNOSIS — R079 Chest pain, unspecified: Secondary | ICD-10-CM | POA: Diagnosis not present

## 2021-07-20 DIAGNOSIS — I2510 Atherosclerotic heart disease of native coronary artery without angina pectoris: Secondary | ICD-10-CM | POA: Diagnosis not present

## 2021-07-20 DIAGNOSIS — Z79899 Other long term (current) drug therapy: Secondary | ICD-10-CM | POA: Diagnosis not present

## 2021-07-20 DIAGNOSIS — Z87891 Personal history of nicotine dependence: Secondary | ICD-10-CM | POA: Diagnosis not present

## 2021-07-20 DIAGNOSIS — R072 Precordial pain: Secondary | ICD-10-CM | POA: Diagnosis present

## 2021-07-20 DIAGNOSIS — I351 Nonrheumatic aortic (valve) insufficiency: Secondary | ICD-10-CM | POA: Insufficient documentation

## 2021-07-20 DIAGNOSIS — I491 Atrial premature depolarization: Secondary | ICD-10-CM | POA: Diagnosis not present

## 2021-07-20 HISTORY — PX: LEFT HEART CATH AND CORONARY ANGIOGRAPHY: CATH118249

## 2021-07-20 SURGERY — LEFT HEART CATH AND CORONARY ANGIOGRAPHY
Anesthesia: LOCAL

## 2021-07-20 MED ORDER — VERAPAMIL HCL 2.5 MG/ML IV SOLN
INTRAVENOUS | Status: DC | PRN
  Administered 2021-07-20: 10 mL via INTRA_ARTERIAL

## 2021-07-20 MED ORDER — SODIUM CHLORIDE 0.9 % IV SOLN: 250.0000 mL | INTRAVENOUS | Status: DC | PRN

## 2021-07-20 MED ORDER — ONDANSETRON HCL 4 MG/2ML IJ SOLN: 4.0000 mg | Freq: Four times a day (QID) | INTRAMUSCULAR | Status: DC | PRN

## 2021-07-20 MED ORDER — SODIUM CHLORIDE 0.9% FLUSH: 3.0000 mL | INTRAVENOUS | Status: DC | PRN

## 2021-07-20 MED ORDER — FENTANYL CITRATE (PF) 100 MCG/2ML IJ SOLN
INTRAMUSCULAR | Status: AC
  Filled 2021-07-20: qty 2

## 2021-07-20 MED ORDER — IOHEXOL 350 MG/ML SOLN
INTRAVENOUS | Status: DC | PRN
  Administered 2021-07-20: 50 mL

## 2021-07-20 MED ORDER — SODIUM CHLORIDE 0.9 % WEIGHT BASED INFUSION
3.0000 mL/kg/h | INTRAVENOUS | Status: AC
  Administered 2021-07-20: 3 mL/kg/h via INTRAVENOUS

## 2021-07-20 MED ORDER — ASPIRIN 81 MG PO CHEW: 81.0000 mg | CHEWABLE_TABLET | ORAL | Status: DC

## 2021-07-20 MED ORDER — FENTANYL CITRATE (PF) 100 MCG/2ML IJ SOLN
INTRAMUSCULAR | Status: DC | PRN
  Administered 2021-07-20: 25 ug via INTRAVENOUS

## 2021-07-20 MED ORDER — HEPARIN (PORCINE) IN NACL 1000-0.9 UT/500ML-% IV SOLN
INTRAVENOUS | Status: AC
  Filled 2021-07-20: qty 1000

## 2021-07-20 MED ORDER — HEPARIN (PORCINE) IN NACL 1000-0.9 UT/500ML-% IV SOLN
INTRAVENOUS | Status: DC | PRN
  Administered 2021-07-20 (×2): 500 mL

## 2021-07-20 MED ORDER — VERAPAMIL HCL 2.5 MG/ML IV SOLN
INTRAVENOUS | Status: AC
  Filled 2021-07-20: qty 2

## 2021-07-20 MED ORDER — FENTANYL CITRATE (PF) 100 MCG/2ML IJ SOLN
INTRAMUSCULAR | Status: AC
Start: 2021-07-20 — End: ?
  Filled 2021-07-20: qty 2

## 2021-07-20 MED ORDER — LIDOCAINE HCL (PF) 1 % IJ SOLN
INTRAMUSCULAR | Status: DC | PRN
  Administered 2021-07-20: 2 mL

## 2021-07-20 MED ORDER — SODIUM CHLORIDE 0.9 % IV SOLN
250.0000 mL | INTRAVENOUS | Status: DC | PRN
Start: 2021-07-20 — End: 2021-07-20

## 2021-07-20 MED ORDER — HEPARIN SODIUM (PORCINE) 1000 UNIT/ML IJ SOLN
INTRAMUSCULAR | Status: AC
  Filled 2021-07-20: qty 10

## 2021-07-20 MED ORDER — MIDAZOLAM HCL 2 MG/2ML IJ SOLN
INTRAMUSCULAR | Status: AC
  Filled 2021-07-20: qty 2

## 2021-07-20 MED ORDER — SODIUM CHLORIDE 0.9% FLUSH: 3.0000 mL | Freq: Two times a day (BID) | INTRAVENOUS | Status: DC

## 2021-07-20 MED ORDER — HEPARIN SODIUM (PORCINE) 1000 UNIT/ML IJ SOLN
INTRAMUSCULAR | Status: DC | PRN
  Administered 2021-07-20: 4000 [IU] via INTRAVENOUS

## 2021-07-20 MED ORDER — MIDAZOLAM HCL 2 MG/2ML IJ SOLN
INTRAMUSCULAR | Status: DC | PRN
  Administered 2021-07-20: 1 mg via INTRAVENOUS

## 2021-07-20 MED ORDER — SODIUM CHLORIDE 0.9 % WEIGHT BASED INFUSION: 1.0000 mL/kg/h | INTRAVENOUS | Status: AC

## 2021-07-20 MED ORDER — LIDOCAINE HCL (PF) 1 % IJ SOLN
INTRAMUSCULAR | Status: AC
  Filled 2021-07-20: qty 30

## 2021-07-20 MED ORDER — ACETAMINOPHEN 325 MG PO TABS: 650.0000 mg | ORAL_TABLET | ORAL | Status: DC | PRN

## 2021-07-20 MED ORDER — SODIUM CHLORIDE 0.9 % WEIGHT BASED INFUSION: 1.0000 mL/kg/h | INTRAVENOUS | Status: DC

## 2021-07-20 SURGICAL SUPPLY — 9 items
BAND ZEPHYR COMPRESS 30 LONG (HEMOSTASIS) ×1 IMPLANT
CATH 5FR JL3.5 JR4 ANG PIG MP (CATHETERS) ×1 IMPLANT
GLIDESHEATH SLEND SS 6F .021 (SHEATH) ×1 IMPLANT
GUIDEWIRE INQWIRE 1.5J.035X260 (WIRE) IMPLANT
INQWIRE 1.5J .035X260CM (WIRE) ×2
KIT HEART LEFT (KITS) ×2 IMPLANT
PACK CARDIAC CATHETERIZATION (CUSTOM PROCEDURE TRAY) ×2 IMPLANT
TRANSDUCER W/STOPCOCK (MISCELLANEOUS) ×2 IMPLANT
TUBING CIL FLEX 10 FLL-RA (TUBING) ×2 IMPLANT

## 2021-07-20 NOTE — Interval H&P Note (Signed)
History and Physical Interval Note: ? ?07/20/2021 ?9:46 AM ? ?Dylan Woods  has presented today for surgery, with the diagnosis of chest pain.  The various methods of treatment have been discussed with the patient and family. After consideration of risks, benefits and other options for treatment, the patient has consented to  Procedure(s): ?LEFT HEART CATH AND CORONARY ANGIOGRAPHY (N/A) as a surgical intervention.  The patient's history has been reviewed, patient examined, no change in status, stable for surgery.  I have reviewed the patient's chart and labs.  Questions were answered to the patient's satisfaction.   ?Cath Lab Visit (complete for each Cath Lab visit) ? ?Clinical Evaluation Leading to the Procedure:  ? ?ACS: No. ? ?Non-ACS:   ? ?Anginal Classification: CCS III ? ?Anti-ischemic medical therapy: No Therapy ? ?Non-Invasive Test Results: No non-invasive testing performed ? ?Prior CABG: No previous CABG ? ? ? ? ? ? ? ?Collier Salina [facility] ?07/20/2021 ?9:46 AM ? ? ? ?

## 2021-07-23 ENCOUNTER — Encounter (HOSPITAL_COMMUNITY): Payer: Self-pay | Admitting: Cardiology

## 2021-07-26 DIAGNOSIS — H4042X0 Glaucoma secondary to eye inflammation, left eye, stage unspecified: Secondary | ICD-10-CM | POA: Diagnosis not present

## 2021-08-02 DIAGNOSIS — H4042X0 Glaucoma secondary to eye inflammation, left eye, stage unspecified: Secondary | ICD-10-CM | POA: Diagnosis not present

## 2021-08-03 DIAGNOSIS — T8522XA Displacement of intraocular lens, initial encounter: Secondary | ICD-10-CM | POA: Diagnosis not present

## 2021-08-03 DIAGNOSIS — D3132 Benign neoplasm of left choroid: Secondary | ICD-10-CM | POA: Diagnosis not present

## 2021-08-03 DIAGNOSIS — H35372 Puckering of macula, left eye: Secondary | ICD-10-CM | POA: Diagnosis not present

## 2021-08-03 DIAGNOSIS — H43813 Vitreous degeneration, bilateral: Secondary | ICD-10-CM | POA: Diagnosis not present

## 2021-08-03 DIAGNOSIS — H35411 Lattice degeneration of retina, right eye: Secondary | ICD-10-CM | POA: Diagnosis not present

## 2021-08-06 ENCOUNTER — Encounter: Payer: Self-pay | Admitting: Cardiology

## 2021-08-06 ENCOUNTER — Ambulatory Visit: Payer: PPO | Admitting: Cardiology

## 2021-08-06 VITALS — BP 108/70 | HR 63 | Ht 73.0 in | Wt 186.8 lb

## 2021-08-06 DIAGNOSIS — I6529 Occlusion and stenosis of unspecified carotid artery: Secondary | ICD-10-CM

## 2021-08-06 DIAGNOSIS — I491 Atrial premature depolarization: Secondary | ICD-10-CM

## 2021-08-06 DIAGNOSIS — E7849 Other hyperlipidemia: Secondary | ICD-10-CM | POA: Diagnosis not present

## 2021-08-06 MED ORDER — METOPROLOL SUCCINATE ER 50 MG PO TB24
50.0000 mg | ORAL_TABLET | Freq: Every day | ORAL | 6 refills | Status: DC
Start: 2021-08-06 — End: 2022-01-30

## 2021-08-06 NOTE — Patient Instructions (Addendum)
Medication Instructions:  ?Your physician has recommended you make the following change in your medication:  ?STOP Diltiazem ?START Metoprolol Succinate (Toprol) 50 mg once daily at bedtime ? ?*If you need a refill on your cardiac medications before your next appointment, please call your pharmacy* ? ? ?Lab Work: ?Today: Fasting lipid profile ? ?If you have labs (blood work) drawn today and your tests are completely normal, you will receive your results only by: ?MyChart Message (if you have MyChart) OR ?A paper copy in the mail ?If you have any lab test that is abnormal or we need to change your treatment, we will call you to review the results. ? ? ?Testing/Procedures: ?None ordered ? ? ?Follow-Up: ?At [facility], you and your health needs are our priority.  As part of our continuing mission to provide you with exceptional heart care, we have created designated Provider Care Teams.  These Care Teams include your primary Cardiologist (physician) and Advanced Practice Providers (APPs -  Physician Assistants and Nurse Practitioners) who all work together to provide you with the care you need, when you need it. ? ?Your next appointment:   ?As needed  ? ?The format for your next appointment:   ?In Person ? ?Provider:   ?Allegra Lai, MD ? ? ? ?Thank you for choosing [facility]!! ? ? ?Trinidad Curet, RN ?[phone] ? ?Other Instructions ? ? Metoprolol Extended-Release Tablets ?What is this medication? ?METOPROLOL (me TOE proe lole) treats high blood pressure and heart failure. It may also be used to prevent chest pain (angina). It works by lowering your blood pressure and heart rate, making it easier for your heart to pump blood to the rest of your body. It belongs to a group of medications called beta blockers. ?This medicine may be used for other purposes; ask your health care provider or pharmacist if you have questions. ?COMMON BRAND NAME(S): toprol, Toprol XL ?What should I tell my care team before I  take this medication? ?They need to know if you have any of these conditions: ?Diabetes ?Heart or vessel disease like slow heart rate, worsening heart failure, heart block, sick sinus syndrome, or Raynaud's disease ?Kidney disease ?Liver disease ?Lung or breathing disease, like asthma or emphysema ?Pheochromocytoma ?Thyroid disease ?An unusual or allergic reaction to metoprolol, other beta blockers, medications, foods, dyes, or preservatives ?Pregnant or trying to get pregnant ?Breast-feeding ?How should I use this medication? ?Take this medication by mouth. Take it as directed on the prescription label at the same time every day. Take it with food. You may cut the tablet in half if it is scored (has a line in the middle of it). This may help you swallow the tablet if the whole tablet is too big. Be sure to take both halves. Do not take just one-half of the tablet. Keep taking it unless your care team tells you to stop. ?Talk to your care team about the use of this medication in children. While it may be prescribed for children as young as 6 years for selected conditions, precautions do apply. ?Overdosage: If you think you have taken too much of this medicine contact a poison control center or emergency room at once. ?NOTE: This medicine is only for you. Do not share this medicine with others. ?What if I miss a dose? ?If you miss a dose, take it as soon as you can. If it is almost time for your next dose, take only that dose. Do not take double or extra  doses. ?What may interact with this medication? ?This medication may interact with the following: ?Certain medications for blood pressure, heart disease, irregular heartbeat ?Certain medications for depression, like monoamine oxidase (MAO) inhibitors, fluoxetine, or paroxetine ?Clonidine ?Dobutamine ?Epinephrine ?Isoproterenol ?Reserpine ?This list may not describe all possible interactions. Give your health care provider a list of all the medicines, herbs,  non-prescription drugs, or dietary supplements you use. Also tell them if you smoke, drink alcohol, or use illegal drugs. Some items may interact with your medicine. ?What should I watch for while using this medication? ?Visit your care team for regular checks on your progress. Check your blood pressure as directed. Ask your care team what your blood pressure should be. Also, find out when you should contact them. ?Do not treat yourself for coughs, colds, or pain while you are using this medication without asking your care team for advice. Some medications may increase your blood pressure. ?You may get drowsy or dizzy. Do not drive, use machinery, or do anything that needs mental alertness until you know how this medication affects you. Do not stand up or sit up quickly, especially if you are an older patient. This reduces the risk of dizzy or fainting spells. Alcohol may interfere with the effect of this medication. Avoid alcoholic drinks. ?This medication may increase blood sugar. Ask your care team if changes in diet or medications are needed if you have diabetes. ?What side effects may I notice from receiving this medication? ?Side effects that you should report to your care team as soon as possible: ?Allergic reactions--skin rash, itching, hives, swelling of the face, lips, tongue, or throat ?Heart failure--shortness of breath, swelling of the ankles, feet, or hands, sudden weight gain, unusual weakness or fatigue ?Low blood pressure--dizziness, feeling faint or lightheaded, blurry vision ?Raynaud's--cool, numb, or painful fingers or toes that may change color from pale, to blue, to red ?Slow heartbeat--dizziness, feeling faint or lightheaded, confusion, trouble breathing, unusual weakness or fatigue ?Worsening mood, feelings of depression ?Side effects that usually do not require medical attention (report to your care team if they continue or are bothersome): ?Change in sex drive or  performance ?Diarrhea ?Dizziness ?Fatigue ?Headache ?This list may not describe all possible side effects. Call your doctor for medical advice about side effects. You may report side effects to FDA at 1-800-FDA-1088. ?Where should I keep my medication? ?Keep out of the reach of children and pets. ?Store at room temperature between 20 and 25 degrees C (68 and 77 degrees F). Throw away any unused medication after the expiration date. ?NOTE: This sheet is a summary. It may not cover all possible information. If you have questions about this medicine, talk to your doctor, pharmacist, or health care provider. ?? 2023 Elsevier/Gold Standard (2021-03-09 00:00:00) ? ?

## 2021-08-06 NOTE — Progress Notes (Signed)
? ?Electrophysiology Office Note ? ? ?Date:  08/06/2021  ? ?ID:  Dylan Woods, DOB 08-24-1945, MRN [id number] ? ?PCP:  Shon Baton, MD  ?Cardiologist:  Marlou Porch ?Primary Electrophysiologist:  Veleria Barnhardt Meredith Leeds, MD   ? ?Chief Complaint: PAC, AT ?  ?History of Present Illness: ?Dylan Woods is a 76 y.o. male who is being seen today for the evaluation of PAC, AT at the request of Jerline Pain, MD. Presenting today for electrophysiology evaluation. ? ?He has a history significant for prostate cancer status post radiation, coronary artery disease with single-vessel disease, mild systolic heart failure. ? ?Today, he denies symptoms of palpitations, chest pain, shortness of breath, orthopnea, PND, lower extremity edema, claudication, dizziness, presyncope, syncope, bleeding, or neurologic sequela. The patient is tolerating medications without difficulties.  Today he feels well.  He has not had any further chest pain since his left heart catheterization.  He was unaware of PVCs, and felt well while wearing his cardiac monitor.  He can exercise without issue.  He walks up to 4 miles in a day.  He can climb stairs without shortness of breath or pain. ? ? ?Past Medical History:  ?Diagnosis Date  ? Anemia   ? Bladder outlet obstruction   ? Degenerative arthritis   ? Diverticulosis   ? Foley catheter in place   ? Heart murmur   ? History of chest pain   ? per pt and epic documentation -- cardiac work-up done --- per dr Marlou Porch note felt to not be cardiac related  ? History of radiation therapy   ? boost w/ radioactive prostate seed implants, 110Gy; then IMRT  45Gy in 25 fractions , 01-20-2017 to 02-24-2017  ? History of urinary retention 12/23/2016  ? post surgery 12-18-2016  ? Hyperplasia of prostate with lower urinary tract symptoms (LUTS)   ? Intermittent palpitations   ? mostly at night--  cardiologist visit w/ dr Marlou Porch 08-07-2017 in epic, ekg showed PACs  ? Internal hemorrhoids   ? Irritable bowel syndrome with  diarrhea   ? Migraines   ? neurologist-  dr [name]  ? Osteoporosis   ? Prostate cancer [facility]) urologist-  dr dahlstedt/  oncologist-  dr [name]  ? dx 03-29-2016-- Stage T1c, Gleason 4+5, PSA 5.06, vol 74.51cc--- Hormone therapy (lupron injection) prostate decreased to 47cc; 12-18-2016  radiactive seed implants;  completed external beam radiation 02-24-2017 PTNS injections.   ? ?Past Surgical History:  ?Procedure Laterality Date  ? CARDIOVASCULAR STRESS TEST  06-19-2016   dr Marlou Porch  ? Intermediate risk nuclear study w/ no reversible ischemia with moderate size and intensity fixed inferoseptal and septal perfusion defect, consider artifact scar/  LVEF nuclear stress 48% (45-54%)  with septal hypokinesis to dyskinesis  ? CATARACT EXTRACTION W/ INTRAOCULAR LENS  IMPLANT, BILATERAL  2011;  2004  ? CIRCUMCISION  age 43  ? CYSTOSCOPY N/A 12/18/2016  ? Procedure: CYSTOSCOPY FLEXIBLE;  Surgeon: [name], MD;  Location: [facility];  Service: Urology;  Laterality: N/A;  NO SEEDS FOUND IN BLADDER  ? CYSTOSCOPY N/A 02/11/2020  ? Procedure: CYSTOSCOPY bladder stone extraction placement of foley catheter;  Surgeon: Ceasar Mons, MD;  Location: [facility];  Service: Urology;  Laterality: N/A;  ? CYSTOSCOPY WITH INSERTION OF UROLIFT  03/ 2019   dr [name]  w/ anesthesia @ [facility]  ? GOLD SEED IMPLANT N/A 12/18/2016  ? Procedure: GOLD SEED IMPLANT;  Surgeon: [name], MD;  Location: [facility];  Service: Urology;  Laterality: N/A;   3 MARKERS  IMPLANTED  ? INGUINAL HERNIA REPAIR Right 1992  ? KNEE ARTHROSCOPY Left 2003;  2008  ? LEFT HEART CATH AND CORONARY ANGIOGRAPHY N/A 07/20/2021  ? Procedure: LEFT HEART CATH AND CORONARY ANGIOGRAPHY;  Surgeon: Martinique, Peter M, MD;  Location: [facility];  Service: Cardiovascular;  Laterality: N/A;  ? NASAL SINUS SURGERY  2015  ? PROSTATE BIOPSY  03-29-2016   dr Pilar Jarvis (office)  ? RADIOACTIVE SEED IMPLANT N/A 12/18/2016   ? Procedure: RADIOACTIVE SEED IMPLANT/BRACHYTHERAPY IMPLANT;  Surgeon: [name], MD;  Location: [facility];  Service: Urology;  Laterality: N/A;    62  SEEDS IMPLANTED  ? rt foot metatarsal Right   ? SPACE OAR INSTILLATION N/A 12/18/2016  ? Procedure: SPACE OAR INSTILLATION;  Surgeon: [name], MD;  Location: [facility];  Service: Urology;  Laterality: N/A;  ? TRANSURETHRAL RESECTION OF PROSTATE N/A 10/31/2017  ? Procedure: TRANSURETHRAL RESECTION OF THE PROSTATE (TURP);  Surgeon: [name], MD;  Location: [facility];  Service: Urology;  Laterality: N/A;  ? ? ? ?Current Outpatient Medications  ?Medication Sig Dispense Refill  ? acetaminophen (TYLENOL) 500 MG tablet Take 1,000 mg by mouth every 6 (six) hours as needed for moderate pain.    ? acetaminophen-codeine (TYLENOL #3) 300-30 MG tablet Take 1 tablet by mouth daily as needed (headaches).    ? alendronate (FOSAMAX) 70 MG tablet Take 1 tablet by mouth every Saturday.    ? Ascorbic Acid 500 MG CHEW Chew 1,000 mg by mouth in the morning and at bedtime.     ? aspirin EC 81 MG tablet Take 81 mg by mouth daily. Swallow whole.    ? Calcium Carbonate-Vit D-Min (CALCIUM 600+D PLUS MINERALS) 600-400 MG-UNIT TABS Take 1 tablet by mouth 2 (two) times daily.    ? Calcium Glycerophosphate (PRELIEF PO) Take 3-4 tablets by mouth 3 (three) times daily as needed (overactive bladder).    ? cetirizine (ZYRTEC) 10 MG tablet Take 10 mg by mouth daily as needed for allergies.    ? desmopressin (DDAVP) 0.2 MG tablet Take 0.6 mg by mouth at bedtime.    ? diltiazem (DILACOR XR) 120 MG 24 hr capsule Take 120 mg by mouth daily.    ? diphenhydramine-acetaminophen (TYLENOL PM) 25-500 MG TABS tablet Take 1 tablet by mouth at bedtime as needed (sleep/pain).    ? doxylamine, Sleep, (UNISOM) 25 MG tablet Take 25 mg by mouth at bedtime.    ? Ferrous Sulfate (IRON SUPPLEMENT PO) Take 2 capsules by mouth 2 (two) times  daily.    ? ketoconazole (NIZORAL) 2 % cream Apply 1 application topically daily as needed for irritation.     ? Menthol, Topical Analgesic, (BIOFREEZE EX) Apply 1 application. topically daily as needed (pain).    ? METAMUCIL FIBER PO Take 1 Dose by mouth 2 (two) times daily.    ? Multiple Vitamins-Minerals (ADULT GUMMY) CHEW Chew 1 capsule by mouth daily.    ? OVER THE COUNTER MEDICATION Take 1.5 Scoops by mouth 2 (two) times daily. sprout living simple pea protein otc supplement powder    ? Propylene Glycol (SYSTANE COMPLETE OP) Place 1 drop into both eyes 3 (three) times daily.    ? rosuvastatin (CRESTOR) 5 MG tablet Take 5 mg by mouth daily.    ? solifenacin (VESICARE) 5 MG tablet Take 5 mg by mouth daily.    ? valACYclovir (VALTREX)  1000 MG tablet Take 1,000 mg by mouth daily as needed (for cold sores).     ? Vibegron (GEMTESA) 75 MG TABS Take 75 mg by mouth daily.    ? ?No current facility-administered medications for this visit.  ? ? ?Allergies:   Ampicillin, Ketorolac, and Betadine [povidone iodine]  ? ?Social History:  The patient  reports that he quit smoking about 51 years ago. His smoking use included cigarettes. He has never used smokeless tobacco. He reports that he does not currently use alcohol. He reports that he does not use drugs.  ? ?Family History:  The patient's family history includes Cancer in his brother and mother; Colon polyps in his father and mother; Parkinsonism in his father.  ? ? ?ROS:  Please see the history of present illness.   Otherwise, review of systems is positive for none.   All other systems are reviewed and negative.  ? ? ?PHYSICAL EXAM: ?VS:  BP 108/70   Pulse 63   Ht '6\' 1"'$  (1.854 m)   Wt 186 lb 12.8 oz (84.7 kg)   SpO2 96%   BMI 24.65 kg/m?  , BMI Body mass index is 24.65 kg/m?. ?GEN: Well nourished, well developed, in no acute distress  ?HEENT: normal  ?Neck: no JVD, carotid bruits, or masses ?Cardiac: RRR; no murmurs, rubs, or gallops,no edema  ?Respiratory:  clear  to auscultation bilaterally, normal work of breathing ?GI: soft, nontender, nondistended, + BS ?MS: no deformity or atrophy  ?Skin: warm and dry ?Neuro:  Strength and sensation are intact ?Psych: euthymic mood, fu

## 2021-08-06 NOTE — Addendum Note (Signed)
Addended by: Stanton Kidney on: 08/06/2021 12:04 PM ? ? Modules accepted: Orders ? ?

## 2021-08-10 ENCOUNTER — Telehealth: Payer: Self-pay | Admitting: *Deleted

## 2021-08-10 NOTE — Telephone Encounter (Signed)
I have spoken to patient to advise that we have gotten cardiac clearance for him to go forward with colonoscopy. He would like to schedule but states that he has several trips planned over the next couple of weeks and would like to call us once he has his calender available and has taken those trips. I advised that he may call back at his convenience to get rescheduled for previsit and colonoscopy in [place]. He verbalizes understanding of this information. ?

## 2021-08-10 NOTE — Telephone Encounter (Signed)
===  View-only below this line=== ?----- Message ----- ?From: Jerene Bears, MD ?Sent: 08/10/2021   1:58 PM EDT ?To: Osvaldo Angst, CRNA, Larina Bras, [place] ? ?Yes, visit with Dr. Curt Bears suffices. ?I will copy Osvaldo Angst to ensure anesthesia in agreement with [place] colon ?John see cards assessment/plan from note on 08/06/21 ?JMP ? ?----- Message ----- ?From: Larina Bras, CMA ?Sent: 08/10/2021  11:34 AM EDT ?To: Jerene Bears, MD ? ?Dr Hilarie Fredrickson- ?We were waiting on cards to approve this patient prior to scheduling for colonoscopy. Please see 08/06/21 cardiology note. Does this suffice as clearance? ?----- Message ----- ?

## 2021-08-10 NOTE — Telephone Encounter (Signed)
Dr.  Pyrtle,  This pt is cleared for anesthetic care at [facility].  Thanks,  Romanda Turrubiates 

## 2021-08-28 ENCOUNTER — Telehealth: Payer: Self-pay

## 2021-08-28 DIAGNOSIS — N3946 Mixed incontinence: Secondary | ICD-10-CM | POA: Diagnosis not present

## 2021-08-28 DIAGNOSIS — N210 Calculus in bladder: Secondary | ICD-10-CM | POA: Diagnosis not present

## 2021-08-28 NOTE — Telephone Encounter (Signed)
? ?  Pre-operative Risk Assessment  ?  ?Patient Name: Dylan Woods  ?DOB: Jun 21, 1945 ?MRN: [id number]  ? ?  ? ?Request for Surgical Clearance   ? ?Procedure:   VITRECTOMY ? ?Date of Surgery:  Clearance 10/03/21                              ?   ?Surgeon:  DR. Ernst Breach  ?Surgeon's Group or Practice Name:  [facility] ?Phone number:  [phone] ?Fax number:  [phone] ?  ?Type of Clearance Requested:   ?- Pharmacy:  Hold Aspirin X 7 DAYS PRIOR TO SURGERY ?  ?Type of Anesthesia:  MAC ?  ?Additional requests/questions:   ? ?Signed, ?Jacinta Shoe   ?08/28/2021, 4:22 PM  ? ?

## 2021-08-28 NOTE — Telephone Encounter (Signed)
? ? ?  Patient Name: Dylan Woods  ?DOB: 06-07-1945 ?MRN: [id number] ? ?Primary Cardiologist: [name], MD ? ?Chart reviewed as part of pre-operative protocol coverage. Given past medical history and time since last visit, based on ACC/AHA guidelines, [facility] would be at acceptable risk for the planned procedure without further cardiovascular testing.  ? ?Recent cardiac catheterization showed nonobstructive disease with 40% mid LAD lesion, medical therapy was recommended.  Patient may hold aspirin for 7 days prior to the procedure and restart as soon as possible afterward at the surgeon's discretion. ? ?I will route this recommendation to the requesting party via Epic fax function and remove from pre-op pool. ? ?Please call with questions. ? ?Almyra Deforest, [place] ?08/28/2021, 7:40 PM ? ?

## 2021-08-28 NOTE — Telephone Encounter (Signed)
Please inform the patient that he is cleared for the eye surgery.  Cardiac clearance letter has been sent to the surgeon's office.  He will need to hold aspirin for 7 days prior to the surgery and restart as soon as possible afterward at the surgeon's discretion. ?

## 2021-08-29 NOTE — Telephone Encounter (Signed)
S/w the pt and he is aware he has been cleared and ok to hold ASA x 7 days prior to procedure. Pt aware to resume ASA once felt safe by surgeon. Notes have been faxed to Dr. Ernst Breach office.  ?

## 2021-08-30 ENCOUNTER — Encounter: Payer: Self-pay | Admitting: Internal Medicine

## 2021-08-30 ENCOUNTER — Other Ambulatory Visit: Payer: PPO

## 2021-08-30 DIAGNOSIS — I6529 Occlusion and stenosis of unspecified carotid artery: Secondary | ICD-10-CM | POA: Diagnosis not present

## 2021-08-30 DIAGNOSIS — E7849 Other hyperlipidemia: Secondary | ICD-10-CM | POA: Diagnosis not present

## 2021-08-30 LAB — LIPID PANEL
Chol/HDL Ratio: 2.9 ratio (ref 0.0–5.0)
Cholesterol, Total: 132 mg/dL (ref 100–199)
HDL: 46 mg/dL (ref >39)
LDL Chol Calc (NIH): 71 mg/dL (ref 0–99)
Triglycerides: 75 mg/dL (ref 0–149)
VLDL Cholesterol Cal: 15 mg/dL (ref 5–40)

## 2021-09-04 ENCOUNTER — Encounter: Payer: Self-pay | Admitting: Internal Medicine

## 2021-09-11 ENCOUNTER — Telehealth: Payer: Self-pay | Admitting: Cardiology

## 2021-09-11 NOTE — Telephone Encounter (Signed)
Patient is requesting to speak with [name]' nurse to review his medications. He states he will discuss in detail when he speaks with the nurse.

## 2021-09-11 NOTE — Telephone Encounter (Signed)
Spoke with pt who is asking when he needs to f/u with Dr Marlou Porch.  Advised generally pts are seen once a year unless they have needs prior to then.  He is aware he will f/u with Dr Curt Bears on an as needed basis.  Pt reports having noticed a few more mild headaches since starting Metoprolol but no other problems.   Recall is in the computer for pt to be seen in 1 year.

## 2021-09-20 HISTORY — PX: VITRECTOMY: SHX106

## 2021-09-25 DIAGNOSIS — N329 Bladder disorder, unspecified: Secondary | ICD-10-CM | POA: Diagnosis not present

## 2021-09-25 DIAGNOSIS — N320 Bladder-neck obstruction: Secondary | ICD-10-CM | POA: Diagnosis not present

## 2021-09-25 DIAGNOSIS — R339 Retention of urine, unspecified: Secondary | ICD-10-CM | POA: Diagnosis not present

## 2021-10-02 ENCOUNTER — Ambulatory Visit: Payer: PPO | Admitting: Psychiatry

## 2021-10-02 ENCOUNTER — Encounter: Payer: Self-pay | Admitting: Psychiatry

## 2021-10-02 VITALS — BP 126/70 | HR 50 | Ht 72.0 in | Wt 186.0 lb

## 2021-10-02 DIAGNOSIS — G43009 Migraine without aura, not intractable, without status migrainosus: Secondary | ICD-10-CM

## 2021-10-02 DIAGNOSIS — G629 Polyneuropathy, unspecified: Secondary | ICD-10-CM | POA: Diagnosis not present

## 2021-10-02 DIAGNOSIS — R2689 Other abnormalities of gait and mobility: Secondary | ICD-10-CM | POA: Diagnosis not present

## 2021-10-02 DIAGNOSIS — R202 Paresthesia of skin: Secondary | ICD-10-CM

## 2021-10-02 NOTE — Progress Notes (Signed)
   CC:  paresthesias, migraines  Follow-up Visit  Last visit: 04/03/21  Brief HPI: 76 year old male with a history of prostate cancer, CAD who follows in clinic for migraines. At his last visit he reported episodes of palpitations and arm paresthesias not associated with his headache. TIA workup was ordered.  Interval History: TTE showed global hypokinesis with runs of atrial tachycardia vs afib. He was referred to cardiology who ordered a heart monitor. This revealed multiple episodes of atrial tachycardia. He also underwent a left heart cath and was diagnosed with CAD.  MRI images were reviewed (no report), brain MRI was normal and he did appear to have left foraminal narrowing on MRI C-spine. Has not had any more episodes of paresthesias and denies neck pain at this time.  His headaches have been stable. Has not had any migraines for the past year off of Topamax. Takes Tylenol for lower level headaches which does help.  He is concerned because he has noticed worsening balance. Denies leg weakness or dizziness, just feels he is unstable. This is particularly bad when he closes his eyes in the shower. Has not had any falls.    Prior Therapies                                  Topamax 50 mg QHS Metoprolol 50 mg daily  Physical Exam:   Vital Signs: BP 126/70   Pulse (!) 50   Ht 6' (1.829 m)   Wt 186 lb (84.4 kg)   BMI 25.23 kg/m  GENERAL:  well appearing, in no acute distress, alert  SKIN:  Color, texture, turgor normal. No rashes or lesions HEAD:  Normocephalic/atraumatic. RESP: normal respiratory effort MSK:  No gross joint deformities.   NEUROLOGICAL: Mental Status: Alert, oriented to person, place and time, Follows commands, and Speech fluent and appropriate. Cranial Nerves: PERRL, face symmetric, no dysarthria, hearing grossly intact Motor: moves all extremities equally, no tremor Sensation: decreased sensation to vibration bilateral feet up to knees. Light touch,  pinprick, and proprioception intact Gait: decreased stride length  IMPRESSION: 76 year old male with a history of prostate cancer, CAD who presents for follow up of headaches and paresthesias. His headaches and paresthesias have been stable, however he is now reporting balance difficulty. His exam is suggestive of lower extremity neuropathy. Will have his recent annual blood work faxed to the office to see if he has had neuropathy labs done recently. May consider EMG if no cause for neuropathy found on blood work. Referral to PT placed for gait and balance.  PLAN: -Referral to PT for balance -He will have annual blood work faxed over to see if neuropathy labs have been checked -may consider EMG if no cause for neuropathy found on blood work  Follow-up: 6 months  I spent a total of 38 minutes on the date of the service. Discussed medication side effects, adverse reactions and drug interactions. Written educational materials and patient instructions outlining all of the above were given.  Genia Harold, MD 10/02/21 11:17 AM

## 2021-10-02 NOTE — Patient Instructions (Signed)
Please have most recent blood work sent to our office Referral for physical therapy for balance

## 2021-10-03 DIAGNOSIS — T8522XA Displacement of intraocular lens, initial encounter: Secondary | ICD-10-CM | POA: Diagnosis not present

## 2021-10-03 DIAGNOSIS — H43392 Other vitreous opacities, left eye: Secondary | ICD-10-CM | POA: Diagnosis not present

## 2021-10-03 DIAGNOSIS — T859XXA Unspecified complication of internal prosthetic device, implant and graft, initial encounter: Secondary | ICD-10-CM | POA: Diagnosis not present

## 2021-10-04 ENCOUNTER — Telehealth: Payer: Self-pay | Admitting: Psychiatry

## 2021-10-04 NOTE — Telephone Encounter (Signed)
Referral for Physical Therapy sent to [facility] at [facility] [phone].

## 2021-10-05 ENCOUNTER — Encounter: Payer: Self-pay | Admitting: Psychiatry

## 2021-10-08 ENCOUNTER — Other Ambulatory Visit: Payer: Self-pay | Admitting: Psychiatry

## 2021-10-08 DIAGNOSIS — G629 Polyneuropathy, unspecified: Secondary | ICD-10-CM

## 2021-10-08 NOTE — Telephone Encounter (Signed)
I reviewed his labs. I'd like to check a couple of extra labs for neuropathy including an updated B12 level and a test to look for blood disorders that can cause neuropathy. I'll place these lab orders for him today

## 2021-10-09 ENCOUNTER — Other Ambulatory Visit: Payer: Self-pay

## 2021-10-09 ENCOUNTER — Other Ambulatory Visit (INDEPENDENT_AMBULATORY_CARE_PROVIDER_SITE_OTHER): Payer: Self-pay

## 2021-10-09 DIAGNOSIS — G629 Polyneuropathy, unspecified: Secondary | ICD-10-CM | POA: Diagnosis not present

## 2021-10-09 DIAGNOSIS — Z0289 Encounter for other administrative examinations: Secondary | ICD-10-CM

## 2021-10-15 ENCOUNTER — Other Ambulatory Visit: Payer: Self-pay | Admitting: Psychiatry

## 2021-10-15 DIAGNOSIS — N3946 Mixed incontinence: Secondary | ICD-10-CM | POA: Diagnosis not present

## 2021-10-15 DIAGNOSIS — G629 Polyneuropathy, unspecified: Secondary | ICD-10-CM

## 2021-10-15 DIAGNOSIS — N210 Calculus in bladder: Secondary | ICD-10-CM | POA: Diagnosis not present

## 2021-10-15 DIAGNOSIS — N401 Enlarged prostate with lower urinary tract symptoms: Secondary | ICD-10-CM | POA: Diagnosis not present

## 2021-10-15 DIAGNOSIS — Z8546 Personal history of malignant neoplasm of prostate: Secondary | ICD-10-CM | POA: Diagnosis not present

## 2021-10-15 LAB — MULTIPLE MYELOMA PANEL, SERUM
Albumin SerPl Elph-Mcnc: 3.5 g/dL (ref 2.9–4.4)
Albumin/Glob SerPl: 1.4 (ref 0.7–1.7)
Alpha 1: 0.2 g/dL (ref 0.0–0.4)
Alpha2 Glob SerPl Elph-Mcnc: 0.6 g/dL (ref 0.4–1.0)
B-Globulin SerPl Elph-Mcnc: 1 g/dL (ref 0.7–1.3)
Gamma Glob SerPl Elph-Mcnc: 0.8 g/dL (ref 0.4–1.8)
Globulin, Total: 2.6 g/dL (ref 2.2–3.9)
IgA/Immunoglobulin A, Serum: 343 mg/dL (ref 61–437)
IgG (Immunoglobin G), Serum: 904 mg/dL (ref 603–1613)
IgM (Immunoglobulin M), Srm: 55 mg/dL (ref 15–143)
Total Protein: 6.1 g/dL (ref 6.0–8.5)

## 2021-10-15 LAB — VITAMIN B12: Vitamin B-12: 817 pg/mL (ref 232–1245)

## 2021-10-17 ENCOUNTER — Encounter: Payer: Self-pay | Admitting: Internal Medicine

## 2021-10-17 ENCOUNTER — Ambulatory Visit (AMBULATORY_SURGERY_CENTER): Payer: Self-pay | Admitting: *Deleted

## 2021-10-17 VITALS — Ht 72.0 in | Wt 180.0 lb

## 2021-10-17 DIAGNOSIS — Z86010 Personal history of colonic polyps: Secondary | ICD-10-CM

## 2021-10-17 MED ORDER — PEG 3350-KCL-NA BICARB-NACL 420 G PO SOLR: 4000.0000 mL | Freq: Once | ORAL | 0 refills | Status: AC

## 2021-10-17 NOTE — Progress Notes (Signed)
No egg  allergy known to patient - Soy  No issues known to pt with past sedation with any surgeries or procedures Patient denies ever being told they had issues or difficulty with intubation  No FH of Malignant Hyperthermia Pt is not on diet pills Pt is not on  home 02  Pt is not on blood thinners  Pt denies issues with constipation  No A fib or A flutter  NO PA's for preps discussed with pt In PV today  Discussed with pt there will be an out-of-pocket cost for prep and that varies from $0 to 70 +  dollars - pt verbalized understanding  Pt instructed to use Singlecare.com or GoodRx for a price reduction on prep

## 2021-10-19 DIAGNOSIS — D649 Anemia, unspecified: Secondary | ICD-10-CM | POA: Diagnosis not present

## 2021-10-19 DIAGNOSIS — N3281 Overactive bladder: Secondary | ICD-10-CM | POA: Diagnosis not present

## 2021-10-19 DIAGNOSIS — F419 Anxiety disorder, unspecified: Secondary | ICD-10-CM | POA: Diagnosis not present

## 2021-10-19 DIAGNOSIS — N401 Enlarged prostate with lower urinary tract symptoms: Secondary | ICD-10-CM | POA: Diagnosis not present

## 2021-10-22 ENCOUNTER — Telehealth: Payer: Self-pay | Admitting: Psychiatry

## 2021-10-22 NOTE — Telephone Encounter (Signed)
Order for NCV/EMG sent to [facility] [phone].

## 2021-10-25 ENCOUNTER — Ambulatory Visit: Payer: PPO | Attending: Psychiatry

## 2021-10-25 DIAGNOSIS — R42 Dizziness and giddiness: Secondary | ICD-10-CM | POA: Diagnosis not present

## 2021-10-25 DIAGNOSIS — R2681 Unsteadiness on feet: Secondary | ICD-10-CM | POA: Diagnosis not present

## 2021-10-25 DIAGNOSIS — R2689 Other abnormalities of gait and mobility: Secondary | ICD-10-CM | POA: Diagnosis not present

## 2021-10-25 NOTE — Therapy (Signed)
OUTPATIENT PHYSICAL THERAPY NEURO EVALUATION   Patient Name: Dylan Woods MRN: [id number] DOB:1946/01/28, 76 y.o., male Today's Date: 10/25/2021   PCP: Shon Baton REFERRING PROVIDER: Genia Harold, MD    PT End of Session - 10/25/21 1447     Visit Number 1    Number of Visits 8    Date for PT Re-Evaluation 01/03/22    Authorization Type [employer]    PT Start Time 1445    PT Stop Time 1530    PT Time Calculation (min) 45 min    Activity Tolerance Patient tolerated treatment well    Behavior During Therapy [facility] for tasks assessed/performed             Past Medical History:  Diagnosis Date   Allergy    Anemia    with prostate cancer treatment   Asthma    as a child   Bladder outlet obstruction    Degenerative arthritis    Diverticulosis    Foley catheter in place    Heart murmur    History of chest pain    per pt and epic documentation -- cardiac work-up done --- per dr Marlou Porch note felt to not be cardiac related   History of radiation therapy    boost w/ radioactive prostate seed implants, 110Gy; then IMRT  45Gy in 25 fractions , 01-20-2017 to 02-24-2017   History of urinary retention 12/23/2016   post surgery 12-18-2016   Hyperlipidemia    on crestor   Hyperplasia of prostate with lower urinary tract symptoms (LUTS)    Intermittent palpitations    mostly at night--  cardiologist visit w/ dr Marlou Porch 08-07-2017 in epic, ekg showed PACs   Internal hemorrhoids    Irritable bowel syndrome with diarrhea    Migraines    neurologist-  dr [name]   Osteoporosis    Prostate cancer [facility]) urologist-  dr dahlstedt/  oncologist-  dr [name]   dx 03-29-2016-- Stage T1c, Gleason 4+5, PSA 5.06, vol 74.51cc--- Hormone therapy (lupron injection) prostate decreased to 47cc; 12-18-2016  radiactive seed implants;  completed external beam radiation 02-24-2017 PTNS injections.    Past Surgical History:  Procedure Laterality Date   BLADDER STONE REMOVAL  2021    CARDIOVASCULAR STRESS TEST  06-19-2016   dr Marlou Porch   Intermediate risk nuclear study w/ no reversible ischemia with moderate size and intensity fixed inferoseptal and septal perfusion defect, consider artifact scar/  LVEF nuclear stress 48% (45-54%)  with septal hypokinesis to dyskinesis   CATARACT EXTRACTION W/ INTRAOCULAR LENS  IMPLANT, BILATERAL  2011;  2004   CIRCUMCISION  age 48   CYSTOSCOPY N/A 12/18/2016   Procedure: CYSTOSCOPY FLEXIBLE;  Surgeon: [name], MD;  Location: [facility];  Service: Urology;  Laterality: N/A;  NO SEEDS FOUND IN BLADDER   CYSTOSCOPY N/A 02/11/2020   Procedure: CYSTOSCOPY bladder stone extraction placement of foley catheter;  Surgeon: Ceasar Mons, MD;  Location: [facility];  Service: Urology;  Laterality: N/A;   CYSTOSCOPY WITH INSERTION OF UROLIFT  03/ 2019   dr [name]  w/ anesthesia @ [facility]   GOLD SEED IMPLANT N/A 12/18/2016   Procedure: GOLD SEED IMPLANT;  Surgeon: [name], MD;  Location: [facility];  Service: Urology;  Laterality: N/A;   3 MARKERS  IMPLANTED   INGUINAL HERNIA REPAIR Right 1992   KNEE ARTHROSCOPY Left 2003;  2008   LEFT HEART CATH AND CORONARY ANGIOGRAPHY N/A 07/20/2021   Procedure:  LEFT HEART CATH AND CORONARY ANGIOGRAPHY;  Surgeon: Martinique, Peter M, MD;  Location: [facility];  Service: Cardiovascular;  Laterality: N/A;   NASAL SINUS SURGERY  2015   PROSTATE BIOPSY  03-29-2016   dr Pilar Jarvis (office)   RADIOACTIVE SEED IMPLANT N/A 12/18/2016   Procedure: RADIOACTIVE SEED IMPLANT/BRACHYTHERAPY IMPLANT;  Surgeon: [name], MD;  Location: [facility];  Service: Urology;  Laterality: N/A;    62  SEEDS IMPLANTED   rt foot metatarsal Right    SPACE OAR INSTILLATION N/A 12/18/2016   Procedure: SPACE OAR INSTILLATION;  Surgeon: [name], MD;  Location: [facility];  Service: Urology;  Laterality: N/A;   TRANSURETHRAL  RESECTION OF PROSTATE N/A 10/31/2017   Procedure: TRANSURETHRAL RESECTION OF THE PROSTATE (TURP);  Surgeon: [name], MD;  Location: [facility];  Service: Urology;  Laterality: N/A;   VITRECTOMY  09/2021   with lens implants   Patient Active Problem List   Diagnosis Date Noted   Precordial pain 07/05/2021   Urinary retention 02/11/2020   Memory loss 07/19/2019   Chronic headaches 07/23/2018   Hyperplasia of prostate with urinary obstruction 10/31/2017   Increased urinary frequency 09/24/2017   OAB (overactive bladder) 09/24/2017   Urinary urgency 09/24/2017   PAC (premature atrial contraction) 06/17/2016   Palpitations 05/22/2016   Malignant neoplasm of prostate ([place]) 05/22/2016   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   Somnolence, daytime 12/27/2014   Common migraine 06/21/2014   Inguinal hernia, left 09/23/2013   Headache 02/10/2013    ONSET DATE: "for a while"  REFERRING DIAG: R26.89 (ICD-10-CM) - Imbalance   THERAPY DIAG:  Unsteadiness on feet  Dizziness and giddiness  Rationale for Evaluation and Treatment Rehabilitation  SUBJECTIVE:                                                                                                                                                                                              SUBJECTIVE STATEMENT: Notes that balance has been declining in the past few years and feeling unsteady with walking and have not been able to hike/walk on trails as a consequence.  Reports issue with arthritis but denies any instance of knee buckling or pain that limits activities. Pt has hx of prostate CA and subsequent treatment that may have caused some peripheral neuropathy.  Pt notes that [place] that he experiences some unsteadiness and blurred vision when turning head quickly to scan intersections. Denies any obvious motion or positional sensitivities Pt accompanied by: self  PERTINENT HISTORY: He is  concerned because he has  noticed worsening balance. Denies leg weakness or dizziness, just feels he is unstable. This is particularly bad when he closes his eyes in the shower. Has not had any falls   PAIN:  Are you having pain? No  PRECAUTIONS: None  WEIGHT BEARING RESTRICTIONS No  FALLS: Has patient fallen in last 6 months? No  LIVING ENVIRONMENT: Lives with: lives alone Lives in: House/apartment Stairs: Yes: Internal: 12 steps; can reach both and External: 12 steps; on right going up Has following equipment at home: None  PLOF: Independent  PATIENT GOALS be able to climb steps with less reliance on HR, be able to walk on trails if possilbe  OBJECTIVE:   DIAGNOSTIC FINDINGS: MRI images were reviewed (no report), brain MRI was normal and he did appear to have left foraminal narrowing on MRI C-spine. Has not had any more episodes of paresthesias and denies neck pain at this time   COGNITION: Overall cognitive status: Within functional limits for tasks assessed   SENSATION: DNT, pt reports nerve conduction test is to take place  COORDINATION: WNL  EDEMA:  none  MUSCLE TONE: WNL   POSTURE: decreased lumbar lordosis and flexed trunk   LOWER EXTREMITY ROM:     WNL   LOWER EXTREMITY MMT:    MMT Right Eval Left Eval  Hip flexion 5 5  Hip extension    Hip abduction 5 5  Hip adduction    Hip internal rotation    Hip external rotation    Knee flexion 5 5  Knee extension 5 5  Ankle dorsiflexion 5 5  Ankle plantarflexion    Ankle inversion    Ankle eversion    --Gross LE strength tested with pt in sitting  BED MOBILITY:  indep  TRANSFERS: independent   STAIRS:  Level of Assistance: Modified independence  Stair Negotiation Technique: Alternating Pattern  with Single Rail on Right  Number of Stairs: 10   Height of Stairs: 4-6"  Comments:   GAIT: Gait pattern: WFL Distance walked:  Assistive device utilized: None Level of assistance: Complete  Independence Comments:   FUNCTIONAL TESTs:  Berg Balance Scale: 53/56 Dynamic Gait Index: 21/24    Ocular assessment:  Saccades: WNL Smooth pursuits: WNL Spontaneous nystagmus: absent Head Impulse Test: +Right; -Left VOR: to be tested VOR cancellation: to be tested   M-CTSIB  Condition 1: Firm Surface, EO 30 Sec, Normal Sway  Condition 2: Firm Surface, EC 30 Sec, Normal and Mild Sway  Condition 3: Foam Surface, EO 30 Sec, Normal Sway  Condition 4: Foam Surface, EC 9 Sec, Severe Sway     TODAY'S TREATMENT:  Evaluation, standing balance for HEP   PATIENT EDUCATION: Education details: assessment findings Person educated: Patient Education method: Theatre stage manager Education comprehension: verbalized understanding   HOME EXERCISE PROGRAM: Access Code: [email or web address] URL: [email or web address] Date: 10/25/2021 Prepared by: Sherlyn Lees  Exercises - Tandem Stance with Eyes Closed in Corner  - 1 x daily - 7 x weekly - 3 sets - 30 sec hold - Corner Balance Feet Together With Eyes Closed  - 1 x daily - 7 x weekly - 3 sets - 30 sec hold    GOALS: Goals reviewed with patient? Yes  SHORT TERM GOALS: Target date: 11/22/2021  The patient will be independent with HEP for gaze adaptation, habituation, balance, and general mobility. Baseline: Goal status: INITIAL    LONG TERM GOALS: Target date: 01/03/2022  Demonstrate improved balance/vestibular function as evidenced by mild sway condition 4 M-CTSIB x  30 sec Baseline: severe with LOB x 9 sec Goal status: INITIAL  2.  Demonstrate improved balance and safety with gait per score 24/24 Dynamic Gait Index Baseline: 21/24, main deficits with head turns whilst walking Goal status: INITIAL  3.  Demonstrate/report improved balance per ability to ambulate 12 stairs without use of HR Baseline: single HR required Goal status: INITIAL    ASSESSMENT:  CLINICAL IMPRESSION: Patient is a 76 y.o. man who was  seen today for physical therapy evaluation and treatment for imbalance and unsteadiness on feet.  Presents with balance deficits and possible involvement of vestibular dysfunction as evidenced by condition 4 M-CTSIB and pathway deviation when walking and performing head turns.  Pt notes feeling of unsteadiness when negotiating stairs requiring HR support and notes imbalance when navigating dark room or with eyes closed.  Patient would benefit from PT services to improve balance and facilitate safe practices with ambulation on uneven surfaces in order to return to leisure activities and typical exercise habit for improved quality of life.    OBJECTIVE IMPAIRMENTS decreased balance, decreased knowledge of condition, difficulty walking, dizziness, and postural dysfunction.   ACTIVITY LIMITATIONS carrying, stairs, and locomotion level  PARTICIPATION LIMITATIONS: community activity and yard work  PERSONAL FACTORS Age, Time since onset of injury/illness/exacerbation, and 1 comorbidity: hx of prostate CA, cardiac  are also affecting patient's functional outcome.   REHAB POTENTIAL: Excellent  CLINICAL DECISION MAKING: Evolving/moderate complexity  EVALUATION COMPLEXITY: Moderate  PLAN: PT FREQUENCY: 1x/week  PT DURATION: 10 weeks  PLANNED INTERVENTIONS: Therapeutic exercises, Therapeutic activity, Neuromuscular re-education, Balance training, Gait training, Patient/Family education, Joint mobilization, Stair training, Vestibular training, Canalith repositioning, DME instructions, Aquatic Therapy, Dry Needling, Cryotherapy, Moist heat, Biofeedback, and Manual therapy  PLAN FOR NEXT SESSION: complete vestibular assessment (VOR/cancellation, etc), walk on uneven surfaces, walk with head tilt   Toniann Fail, PT 10/25/2021, 4:45 PM

## 2021-10-29 ENCOUNTER — Other Ambulatory Visit: Payer: Self-pay | Admitting: Urology

## 2021-11-01 ENCOUNTER — Ambulatory Visit: Payer: PPO | Admitting: Physical Therapy

## 2021-11-01 ENCOUNTER — Encounter: Payer: Self-pay | Admitting: Physical Therapy

## 2021-11-01 DIAGNOSIS — R2681 Unsteadiness on feet: Secondary | ICD-10-CM

## 2021-11-01 DIAGNOSIS — R42 Dizziness and giddiness: Secondary | ICD-10-CM

## 2021-11-01 NOTE — Therapy (Signed)
OUTPATIENT PHYSICAL THERAPY NEURO TREATMENT   Patient Name: Dylan Woods MRN: [id number] DOB:09-24-1945, 76 y.o., male Today's Date: 11/01/2021   PCP: Shon Baton REFERRING PROVIDER: Genia Harold, MD    PT End of Session - 11/01/21 1626     Visit Number 2    Number of Visits 8    Date for PT Re-Evaluation 01/03/22    Authorization Type [employer]    PT Start Time 1530    PT Stop Time 1613    PT Time Calculation (min) 43 min    Activity Tolerance Patient tolerated treatment well    Behavior During Therapy [facility] for tasks assessed/performed              Past Medical History:  Diagnosis Date   Allergy    Anemia    with prostate cancer treatment   Asthma    as a child   Bladder outlet obstruction    Degenerative arthritis    Diverticulosis    Foley catheter in place    Heart murmur    History of chest pain    per pt and epic documentation -- cardiac work-up done --- per dr Marlou Porch note felt to not be cardiac related   History of radiation therapy    boost w/ radioactive prostate seed implants, 110Gy; then IMRT  45Gy in 25 fractions , 01-20-2017 to 02-24-2017   History of urinary retention 12/23/2016   post surgery 12-18-2016   Hyperlipidemia    on crestor   Hyperplasia of prostate with lower urinary tract symptoms (LUTS)    Intermittent palpitations    mostly at night--  cardiologist visit w/ dr Marlou Porch 08-07-2017 in epic, ekg showed PACs   Internal hemorrhoids    Irritable bowel syndrome with diarrhea    Migraines    neurologist-  dr [name]   Osteoporosis    Prostate cancer [facility]) urologist-  dr dahlstedt/  oncologist-  dr [name]   dx 03-29-2016-- Stage T1c, Gleason 4+5, PSA 5.06, vol 74.51cc--- Hormone therapy (lupron injection) prostate decreased to 47cc; 12-18-2016  radiactive seed implants;  completed external beam radiation 02-24-2017 PTNS injections.    Past Surgical History:  Procedure Laterality Date   BLADDER STONE REMOVAL  2021    CARDIOVASCULAR STRESS TEST  06-19-2016   dr Marlou Porch   Intermediate risk nuclear study w/ no reversible ischemia with moderate size and intensity fixed inferoseptal and septal perfusion defect, consider artifact scar/  LVEF nuclear stress 48% (45-54%)  with septal hypokinesis to dyskinesis   CATARACT EXTRACTION W/ INTRAOCULAR LENS  IMPLANT, BILATERAL  2011;  2004   CIRCUMCISION  age 49   CYSTOSCOPY N/A 12/18/2016   Procedure: CYSTOSCOPY FLEXIBLE;  Surgeon: [name], MD;  Location: [facility];  Service: Urology;  Laterality: N/A;  NO SEEDS FOUND IN BLADDER   CYSTOSCOPY N/A 02/11/2020   Procedure: CYSTOSCOPY bladder stone extraction placement of foley catheter;  Surgeon: Ceasar Mons, MD;  Location: [facility];  Service: Urology;  Laterality: N/A;   CYSTOSCOPY WITH INSERTION OF UROLIFT  03/ 2019   dr [name]  w/ anesthesia @ [facility]   GOLD SEED IMPLANT N/A 12/18/2016   Procedure: GOLD SEED IMPLANT;  Surgeon: [name], MD;  Location: [facility];  Service: Urology;  Laterality: N/A;   3 MARKERS  IMPLANTED   INGUINAL HERNIA REPAIR Right 1992   KNEE ARTHROSCOPY Left 2003;  2008   LEFT HEART CATH AND CORONARY ANGIOGRAPHY N/A 07/20/2021  Procedure: LEFT HEART CATH AND CORONARY ANGIOGRAPHY;  Surgeon: Martinique, Peter M, MD;  Location: [place] [facility];  Service: Cardiovascular;  Laterality: N/A;   NASAL SINUS SURGERY  2015   PROSTATE BIOPSY  03-29-2016   dr Pilar Jarvis (office)   RADIOACTIVE SEED IMPLANT N/A 12/18/2016   Procedure: RADIOACTIVE SEED IMPLANT/BRACHYTHERAPY IMPLANT;  Surgeon: [name], MD;  Location: [facility];  Service: Urology;  Laterality: N/A;    62  SEEDS IMPLANTED   rt foot metatarsal Right    SPACE OAR INSTILLATION N/A 12/18/2016   Procedure: SPACE OAR INSTILLATION;  Surgeon: [name], MD;  Location: [facility];  Service: Urology;  Laterality: N/A;   TRANSURETHRAL  RESECTION OF PROSTATE N/A 10/31/2017   Procedure: TRANSURETHRAL RESECTION OF THE PROSTATE (TURP);  Surgeon: [name], MD;  Location: [facility];  Service: Urology;  Laterality: N/A;   VITRECTOMY  09/2021   with lens implants   Patient Active Problem List   Diagnosis Date Noted   Precordial pain 07/05/2021   Urinary retention 02/11/2020   Memory loss 07/19/2019   Chronic headaches 07/23/2018   Hyperplasia of prostate with urinary obstruction 10/31/2017   Increased urinary frequency 09/24/2017   OAB (overactive bladder) 09/24/2017   Urinary urgency 09/24/2017   PAC (premature atrial contraction) 06/17/2016   Palpitations 05/22/2016   Malignant neoplasm of prostate ([place]) 05/22/2016   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   Somnolence, daytime 12/27/2014   Common migraine 06/21/2014   Inguinal hernia, left 09/23/2013   Headache 02/10/2013    ONSET DATE: "for a while"  REFERRING DIAG: R26.89 (ICD-10-CM) - Imbalance   THERAPY DIAG:  Unsteadiness on feet  Dizziness and giddiness  Rationale for Evaluation and Treatment Rehabilitation  SUBJECTIVE:                                                                                                                                                                                              SUBJECTIVE STATEMENT:  I generally feel unsteady on meet, my sense of balance is very poor. Vision can change a bit when I turn my head. I am scheduled to have that cystoscopy at the end of August, the doctor wants to see if there's any neuropathy in my feet. I frequently experience shooting pains in my feet front to back and back to front, it also feels like I constantly have a pebble in my shoe.   Pt accompanied by: self  PERTINENT HISTORY: He is concerned because he has noticed worsening balance. Denies leg weakness or dizziness, just feels he is unstable. This is  particularly bad when he closes his eyes in the shower. Has  not had any falls   PAIN:  Are you having pain? No  PRECAUTIONS: None  WEIGHT BEARING RESTRICTIONS No  FALLS: Has patient fallen in last 6 months? No  LIVING ENVIRONMENT: Lives with: lives alone Lives in: House/apartment Stairs: Yes: Internal: 12 steps; can reach both and External: 12 steps; on right going up Has following equipment at home: None  PLOF: Independent  PATIENT GOALS be able to climb steps with less reliance on HR, be able to walk on trails if possilbe  TREATMENT 11/01/21  Horizontal and vertical VORs WNL  Eye ROM and smooth pursuits intact, saccades WNL  VOR cancellation (-)  Head thrust (+) R, (-) L              Posterior canal testing (-) B               Tandem stance on foam 3x30 seconds B               Tandem gait in // bars forwards only x4 laps up to MinA for balance               SLS on blue foam pad with light touch on 4 inch box 3x30 seconds B               Narrow BOS eyes shut 3x30 seconds                Narrow BOS eyes open vertical and horizontal head turns on foam               Tandem stance on foam horizontal head turns x3 rounds B     OBJECTIVE:   DIAGNOSTIC FINDINGS: MRI images were reviewed (no report), brain MRI was normal and he did appear to have left foraminal narrowing on MRI C-spine. Has not had any more episodes of paresthesias and denies neck pain at this time   COGNITION: Overall cognitive status: Within functional limits for tasks assessed   SENSATION: DNT, pt reports nerve conduction test is to take place  COORDINATION: WNL  EDEMA:  none  MUSCLE TONE: WNL   POSTURE: decreased lumbar lordosis and flexed trunk   LOWER EXTREMITY ROM:     WNL   LOWER EXTREMITY MMT:    MMT Right Eval Left Eval  Hip flexion 5 5  Hip extension    Hip abduction 5 5  Hip adduction    Hip internal rotation    Hip external rotation    Knee flexion 5 5  Knee extension 5 5  Ankle dorsiflexion 5 5  Ankle plantarflexion    Ankle  inversion    Ankle eversion    --Gross LE strength tested with pt in sitting  BED MOBILITY:  indep  TRANSFERS: independent   STAIRS:  Level of Assistance: Modified independence  Stair Negotiation Technique: Alternating Pattern  with Single Rail on Right  Number of Stairs: 10   Height of Stairs: 4-6"  Comments:   GAIT: Gait pattern: WFL Distance walked:  Assistive device utilized: None Level of assistance: Complete Independence Comments:   FUNCTIONAL TESTs:  Berg Balance Scale: 53/56 Dynamic Gait Index: 21/24    Ocular assessment:  Saccades: WNL Smooth pursuits: WNL Spontaneous nystagmus: absent Head Impulse Test: +Right; -Left VOR: to be tested VOR cancellation: to be tested   M-CTSIB  Condition 1: Firm Surface, EO 30 Sec, Normal Sway  Condition 2: Firm Surface,  EC 30 Sec, Normal and Mild Sway  Condition 3: Foam Surface, EO 30 Sec, Normal Sway  Condition 4: Foam Surface, EC 9 Sec, Severe Sway     TODAY'S TREATMENT:  Evaluation, standing balance for HEP   PATIENT EDUCATION: Education details: assessment findings Person educated: Patient Education method: Theatre stage manager Education comprehension: verbalized understanding   HOME EXERCISE PROGRAM: Access Code: [email or web address] URL: [email or web address] Date: 10/25/2021 Prepared by: Sherlyn Lees  Exercises - Tandem Stance with Eyes Closed in Corner  - 1 x daily - 7 x weekly - 3 sets - 30 sec hold - Corner Balance Feet Together With Eyes Closed  - 1 x daily - 7 x weekly - 3 sets - 30 sec hold    GOALS: Goals reviewed with patient? Yes  SHORT TERM GOALS: Target date: 11/22/2021  The patient will be independent with HEP for gaze adaptation, habituation, balance, and general mobility. Baseline: Goal status: INITIAL    LONG TERM GOALS: Target date: 01/03/2022  Demonstrate improved balance/vestibular function as evidenced by mild sway condition 4 M-CTSIB x 30 sec Baseline: severe  with LOB x 9 sec Goal status: INITIAL  2.  Demonstrate improved balance and safety with gait per score 24/24 Dynamic Gait Index Baseline: 21/24, main deficits with head turns whilst walking Goal status: INITIAL  3.  Demonstrate/report improved balance per ability to ambulate 12 stairs without use of HR Baseline: single HR required Goal status: INITIAL    ASSESSMENT:  CLINICAL IMPRESSION:  Mr. Afonso arrives today doing well, mostly concerned about his balance and possibly neuropathy. Went through vestibular testing more in depth today with all tests negative except for head impulse testing. Otherwise focused on balance training today, needed up to MinA in // bars. Will continue to progress as able.   OBJECTIVE IMPAIRMENTS decreased balance, decreased knowledge of condition, difficulty walking, dizziness, and postural dysfunction.   ACTIVITY LIMITATIONS carrying, stairs, and locomotion level  PARTICIPATION LIMITATIONS: community activity and yard work  PERSONAL FACTORS Age, Time since onset of injury/illness/exacerbation, and 1 comorbidity: hx of prostate CA, cardiac  are also affecting patient's functional outcome.   REHAB POTENTIAL: Excellent  CLINICAL DECISION MAKING: Evolving/moderate complexity  EVALUATION COMPLEXITY: Moderate  PLAN: PT FREQUENCY: 1x/week  PT DURATION: 10 weeks  PLANNED INTERVENTIONS: Therapeutic exercises, Therapeutic activity, Neuromuscular re-education, Balance training, Gait training, Patient/Family education, Joint mobilization, Stair training, Vestibular training, Canalith repositioning, DME instructions, Aquatic Therapy, Dry Needling, Cryotherapy, Moist heat, Biofeedback, and Manual therapy  PLAN FOR NEXT SESSION: walk on uneven surfaces, walk with head tilt, progress general balance challenges    Yuleidy Rappleye U PT DPT PN2  11/01/2021, 4:26 PM

## 2021-11-06 ENCOUNTER — Ambulatory Visit: Payer: PPO

## 2021-11-07 DIAGNOSIS — L570 Actinic keratosis: Secondary | ICD-10-CM | POA: Diagnosis not present

## 2021-11-07 DIAGNOSIS — L821 Other seborrheic keratosis: Secondary | ICD-10-CM | POA: Diagnosis not present

## 2021-11-07 DIAGNOSIS — Z85828 Personal history of other malignant neoplasm of skin: Secondary | ICD-10-CM | POA: Diagnosis not present

## 2021-11-07 DIAGNOSIS — D225 Melanocytic nevi of trunk: Secondary | ICD-10-CM | POA: Diagnosis not present

## 2021-11-07 DIAGNOSIS — D2262 Melanocytic nevi of left upper limb, including shoulder: Secondary | ICD-10-CM | POA: Diagnosis not present

## 2021-11-08 DIAGNOSIS — B009 Herpesviral infection, unspecified: Secondary | ICD-10-CM | POA: Diagnosis not present

## 2021-11-08 DIAGNOSIS — I499 Cardiac arrhythmia, unspecified: Secondary | ICD-10-CM | POA: Diagnosis not present

## 2021-11-08 DIAGNOSIS — I2510 Atherosclerotic heart disease of native coronary artery without angina pectoris: Secondary | ICD-10-CM | POA: Diagnosis not present

## 2021-11-08 DIAGNOSIS — H04123 Dry eye syndrome of bilateral lacrimal glands: Secondary | ICD-10-CM | POA: Diagnosis not present

## 2021-11-08 DIAGNOSIS — I10 Essential (primary) hypertension: Secondary | ICD-10-CM | POA: Diagnosis not present

## 2021-11-08 DIAGNOSIS — E785 Hyperlipidemia, unspecified: Secondary | ICD-10-CM | POA: Diagnosis not present

## 2021-11-08 DIAGNOSIS — G43909 Migraine, unspecified, not intractable, without status migrainosus: Secondary | ICD-10-CM | POA: Diagnosis not present

## 2021-11-08 DIAGNOSIS — H35373 Puckering of macula, bilateral: Secondary | ICD-10-CM | POA: Diagnosis not present

## 2021-11-08 DIAGNOSIS — H43811 Vitreous degeneration, right eye: Secondary | ICD-10-CM | POA: Diagnosis not present

## 2021-11-08 DIAGNOSIS — D84822 Immunodeficiency due to external causes: Secondary | ICD-10-CM | POA: Diagnosis not present

## 2021-11-08 DIAGNOSIS — M810 Age-related osteoporosis without current pathological fracture: Secondary | ICD-10-CM | POA: Diagnosis not present

## 2021-11-08 DIAGNOSIS — G8929 Other chronic pain: Secondary | ICD-10-CM | POA: Diagnosis not present

## 2021-11-08 DIAGNOSIS — J309 Allergic rhinitis, unspecified: Secondary | ICD-10-CM | POA: Diagnosis not present

## 2021-11-08 DIAGNOSIS — M1990 Unspecified osteoarthritis, unspecified site: Secondary | ICD-10-CM | POA: Diagnosis not present

## 2021-11-09 ENCOUNTER — Encounter: Payer: Self-pay | Admitting: Internal Medicine

## 2021-11-12 DIAGNOSIS — H4042X0 Glaucoma secondary to eye inflammation, left eye, stage unspecified: Secondary | ICD-10-CM | POA: Diagnosis not present

## 2021-11-13 ENCOUNTER — Ambulatory Visit: Payer: PPO

## 2021-11-13 DIAGNOSIS — R2681 Unsteadiness on feet: Secondary | ICD-10-CM

## 2021-11-13 DIAGNOSIS — R42 Dizziness and giddiness: Secondary | ICD-10-CM

## 2021-11-13 NOTE — Therapy (Signed)
OUTPATIENT PHYSICAL THERAPY NEURO TREATMENT   Patient Name: Dylan Woods MRN: [id number] DOB:05-24-1945, 76 y.o., male Today's Date: 11/13/2021   PCP: Shon Baton REFERRING PROVIDER: Genia Harold, MD    PT End of Session - 11/13/21 1352     Visit Number 3    Number of Visits 8    Date for PT Re-Evaluation 01/03/22    Authorization Type [employer]    PT Start Time 1400    PT Stop Time 7408    PT Time Calculation (min) 45 min    Activity Tolerance Patient tolerated treatment well    Behavior During Therapy [facility] for tasks assessed/performed              Past Medical History:  Diagnosis Date   Allergy    Anemia    with prostate cancer treatment   Asthma    as a child   Bladder outlet obstruction    Degenerative arthritis    Diverticulosis    Foley catheter in place    Heart murmur    History of chest pain    per pt and epic documentation -- cardiac work-up done --- per dr Marlou Porch note felt to not be cardiac related   History of radiation therapy    boost w/ radioactive prostate seed implants, 110Gy; then IMRT  45Gy in 25 fractions , 01-20-2017 to 02-24-2017   History of urinary retention 12/23/2016   post surgery 12-18-2016   Hyperlipidemia    on crestor   Hyperplasia of prostate with lower urinary tract symptoms (LUTS)    Intermittent palpitations    mostly at night--  cardiologist visit w/ dr Marlou Porch 08-07-2017 in epic, ekg showed PACs   Internal hemorrhoids    Irritable bowel syndrome with diarrhea    Migraines    neurologist-  dr [name]   Osteoporosis    Prostate cancer [facility]) urologist-  dr dahlstedt/  oncologist-  dr [name]   dx 03-29-2016-- Stage T1c, Gleason 4+5, PSA 5.06, vol 74.51cc--- Hormone therapy (lupron injection) prostate decreased to 47cc; 12-18-2016  radiactive seed implants;  completed external beam radiation 02-24-2017 PTNS injections.    Past Surgical History:  Procedure Laterality Date   BLADDER STONE REMOVAL  2021    CARDIOVASCULAR STRESS TEST  06-19-2016   dr Marlou Porch   Intermediate risk nuclear study w/ no reversible ischemia with moderate size and intensity fixed inferoseptal and septal perfusion defect, consider artifact scar/  LVEF nuclear stress 48% (45-54%)  with septal hypokinesis to dyskinesis   CATARACT EXTRACTION W/ INTRAOCULAR LENS  IMPLANT, BILATERAL  2011;  2004   CIRCUMCISION  age 73   CYSTOSCOPY N/A 12/18/2016   Procedure: CYSTOSCOPY FLEXIBLE;  Surgeon: [name], MD;  Location: [facility];  Service: Urology;  Laterality: N/A;  NO SEEDS FOUND IN BLADDER   CYSTOSCOPY N/A 02/11/2020   Procedure: CYSTOSCOPY bladder stone extraction placement of foley catheter;  Surgeon: Ceasar Mons, MD;  Location: [facility];  Service: Urology;  Laterality: N/A;   CYSTOSCOPY WITH INSERTION OF UROLIFT  03/ 2019   dr [name]  w/ anesthesia @ [facility]   GOLD SEED IMPLANT N/A 12/18/2016   Procedure: GOLD SEED IMPLANT;  Surgeon: [name], MD;  Location: [facility];  Service: Urology;  Laterality: N/A;   3 MARKERS  IMPLANTED   INGUINAL HERNIA REPAIR Right 1992   KNEE ARTHROSCOPY Left 2003;  2008   LEFT HEART CATH AND CORONARY ANGIOGRAPHY N/A 07/20/2021  Procedure: LEFT HEART CATH AND CORONARY ANGIOGRAPHY;  Surgeon: Martinique, Peter M, MD;  Location: [place] [facility];  Service: Cardiovascular;  Laterality: N/A;   NASAL SINUS SURGERY  2015   PROSTATE BIOPSY  03-29-2016   dr Pilar Jarvis (office)   RADIOACTIVE SEED IMPLANT N/A 12/18/2016   Procedure: RADIOACTIVE SEED IMPLANT/BRACHYTHERAPY IMPLANT;  Surgeon: [name], MD;  Location: [facility];  Service: Urology;  Laterality: N/A;    62  SEEDS IMPLANTED   rt foot metatarsal Right    SPACE OAR INSTILLATION N/A 12/18/2016   Procedure: SPACE OAR INSTILLATION;  Surgeon: [name], MD;  Location: [facility];  Service: Urology;  Laterality: N/A;   TRANSURETHRAL  RESECTION OF PROSTATE N/A 10/31/2017   Procedure: TRANSURETHRAL RESECTION OF THE PROSTATE (TURP);  Surgeon: [name], MD;  Location: [facility];  Service: Urology;  Laterality: N/A;   VITRECTOMY  09/2021   with lens implants   Patient Active Problem List   Diagnosis Date Noted   Precordial pain 07/05/2021   Urinary retention 02/11/2020   Memory loss 07/19/2019   Chronic headaches 07/23/2018   Hyperplasia of prostate with urinary obstruction 10/31/2017   Increased urinary frequency 09/24/2017   OAB (overactive bladder) 09/24/2017   Urinary urgency 09/24/2017   PAC (premature atrial contraction) 06/17/2016   Palpitations 05/22/2016   Malignant neoplasm of prostate ([place]) 05/22/2016   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   Somnolence, daytime 12/27/2014   Common migraine 06/21/2014   Inguinal hernia, left 09/23/2013   Headache 02/10/2013    ONSET DATE: "for a while"  REFERRING DIAG: R26.89 (ICD-10-CM) - Imbalance   THERAPY DIAG:  Unsteadiness on feet  Dizziness and giddiness  Rationale for Evaluation and Treatment Rehabilitation  SUBJECTIVE:                                                                                                                                                                                              SUBJECTIVE STATEMENT:  no change for better or worse  Pt accompanied by: self  PERTINENT HISTORY: He is concerned because he has noticed worsening balance. Denies leg weakness or dizziness, just feels he is unstable. This is particularly bad when he closes his eyes in the shower. Has not had any falls   PAIN:  Are you having pain? No  PRECAUTIONS: None  WEIGHT BEARING RESTRICTIONS No  FALLS: Has patient fallen in last 6 months? No  LIVING ENVIRONMENT: Lives with: lives alone Lives in: House/apartment Stairs: Yes: Internal: 12 steps; can reach both and External: 12 steps; on right going up Has  following equipment  at home: None  PLOF: Independent  PATIENT GOALS be able to climb steps with less reliance on HR, be able to walk on trails if possilbe     OBJECTIVE:   TODAY'S TREATMENT: 11/13/21 Activity Comments  Standing in corner balance EC x 30 sec EO+head turns x 5 reps EC+head turns x 5 reps Tandem stance x 30 sec  Standing quick head turns 10x target 8 ft apart   Walking with eyes closed 4x30 ft. Head tilt/eyes closed 3+ ft pathway deviation  VOR x 1 horizontal (seated 2x30 sec)   Standing on foam EC x 30 sec EO/EC + head turns/vertical 5x  Tandem stance on firm EO+ head turns 5x    DIAGNOSTIC FINDINGS: MRI images were reviewed (no report), brain MRI was normal and he did appear to have left foraminal narrowing on MRI C-spine. Has not had any more episodes of paresthesias and denies neck pain at this time   COGNITION: Overall cognitive status: Within functional limits for tasks assessed   SENSATION: DNT, pt reports nerve conduction test is to take place  COORDINATION: WNL  EDEMA:  none  MUSCLE TONE: WNL   POSTURE: decreased lumbar lordosis and flexed trunk   LOWER EXTREMITY ROM:     WNL   LOWER EXTREMITY MMT:    MMT Right Eval Left Eval  Hip flexion 5 5  Hip extension    Hip abduction 5 5  Hip adduction    Hip internal rotation    Hip external rotation    Knee flexion 5 5  Knee extension 5 5  Ankle dorsiflexion 5 5  Ankle plantarflexion    Ankle inversion    Ankle eversion    --Gross LE strength tested with pt in sitting  BED MOBILITY:  indep  TRANSFERS: independent   STAIRS:  Level of Assistance: Modified independence  Stair Negotiation Technique: Alternating Pattern  with Single Rail on Right  Number of Stairs: 10   Height of Stairs: 4-6"  Comments:   GAIT: Gait pattern: WFL Distance walked:  Assistive device utilized: None Level of assistance: Complete Independence Comments:   FUNCTIONAL TESTs:  Berg Balance Scale: 53/56 Dynamic Gait  Index: 21/24    Ocular assessment:  Saccades: WNL Smooth pursuits: WNL Spontaneous nystagmus: absent Head Impulse Test: +Right; -Left VOR: to be tested VOR cancellation: to be tested   M-CTSIB  Condition 1: Firm Surface, EO 30 Sec, Normal Sway  Condition 2: Firm Surface, EC 30 Sec, Normal and Mild Sway  Condition 3: Foam Surface, EO 30 Sec, Normal Sway  Condition 4: Foam Surface, EC 9 Sec, Severe Sway     TODAY'S TREATMENT:  Evaluation, standing balance for HEP   PATIENT EDUCATION: Education details: assessment findings Person educated: Patient Education method: Theatre stage manager Education comprehension: verbalized understanding   HOME EXERCISE PROGRAM: Access Code: [email or web address] URL: [email or web address] Date: 10/25/2021 Prepared by: Sherlyn Lees  Exercises - Tandem Stance with Eyes Closed in Corner  - 1 x daily - 7 x weekly - 3 sets - 30 sec hold - Corner Balance Feet Together With Eyes Closed  - 1 x daily - 7 x weekly - 3 sets - 30 sec hold    GOALS: Goals reviewed with patient? Yes  SHORT TERM GOALS: Target date: 11/22/2021  The patient will be independent with HEP for gaze adaptation, habituation, balance, and general mobility. Baseline: Goal status: INITIAL    LONG TERM GOALS: Target date: 01/03/2022  Demonstrate improved balance/vestibular function as  evidenced by mild sway condition 4 M-CTSIB x 30 sec Baseline: severe with LOB x 9 sec Goal status: INITIAL  2.  Demonstrate improved balance and safety with gait per score 24/24 Dynamic Gait Index Baseline: 21/24, main deficits with head turns whilst walking Goal status: INITIAL  3.  Demonstrate/report improved balance per ability to ambulate 12 stairs without use of HR Baseline: single HR required Goal status: INITIAL    ASSESSMENT:  CLINICAL IMPRESSION: +Head Impulse test and difficulty with narrow BOS activities and difficulty in maintaining static standing with head turns.   Tx focus on balance strategies and gaze stabilization to reduce unsteadiness with head turns to improve safety with walking and environmental scanning. Continued sessions for balance/vestibular rehabilitation   OBJECTIVE IMPAIRMENTS decreased balance, decreased knowledge of condition, difficulty walking, dizziness, and postural dysfunction.   ACTIVITY LIMITATIONS carrying, stairs, and locomotion level  PARTICIPATION LIMITATIONS: community activity and yard work  PERSONAL FACTORS Age, Time since onset of injury/illness/exacerbation, and 1 comorbidity: hx of prostate CA, cardiac  are also affecting patient's functional outcome.   REHAB POTENTIAL: Excellent  CLINICAL DECISION MAKING: Evolving/moderate complexity  EVALUATION COMPLEXITY: Moderate  PLAN: PT FREQUENCY: 1x/week  PT DURATION: 10 weeks  PLANNED INTERVENTIONS: Therapeutic exercises, Therapeutic activity, Neuromuscular re-education, Balance training, Gait training, Patient/Family education, Joint mobilization, Stair training, Vestibular training, Canalith repositioning, DME instructions, Aquatic Therapy, Dry Needling, Cryotherapy, Moist heat, Biofeedback, and Manual therapy  PLAN FOR NEXT SESSION: walk on uneven surfaces, walk with head tilt, progress general balance challenges . VOR in standing/foam   2:45 PM, 11/13/21 M. Sherlyn Lees, PT, DPT Physical Therapist- [place] Office Number: [phone]

## 2021-11-16 ENCOUNTER — Ambulatory Visit (AMBULATORY_SURGERY_CENTER): Payer: PPO | Admitting: Internal Medicine

## 2021-11-16 ENCOUNTER — Encounter: Payer: PPO | Admitting: Gastroenterology

## 2021-11-16 ENCOUNTER — Encounter: Payer: Self-pay | Admitting: Internal Medicine

## 2021-11-16 VITALS — BP 119/57 | HR 58 | Temp 95.9°F | Resp 20 | Ht 72.0 in | Wt 180.0 lb

## 2021-11-16 DIAGNOSIS — Z1211 Encounter for screening for malignant neoplasm of colon: Secondary | ICD-10-CM

## 2021-11-16 DIAGNOSIS — Z8371 Family history of colonic polyps: Secondary | ICD-10-CM

## 2021-11-16 HISTORY — PX: OTHER SURGICAL HISTORY: SHX169

## 2021-11-16 MED ORDER — SODIUM CHLORIDE 0.9 % IV SOLN: 500.0000 mL | Freq: Once | INTRAVENOUS | Status: DC

## 2021-11-16 NOTE — Patient Instructions (Signed)
YOU HAD AN ENDOSCOPIC PROCEDURE TODAY AT THE [facility]:   Refer to the procedure report that was given to you for any specific questions about what was found during the examination.  If the procedure report does not answer your questions, please call your gastroenterologist to clarify.  If you requested that your care partner not be given the details of your procedure findings, then the procedure report has been included in a sealed envelope for you to review at your convenience later.  YOU SHOULD EXPECT: Some feelings of bloating in the abdomen. Passage of more gas than usual.  Walking can help get rid of the air that was put into your GI tract during the procedure and reduce the bloating. If you had a lower endoscopy (such as a colonoscopy or flexible sigmoidoscopy) you may notice spotting of blood in your stool or on the toilet paper. If you underwent a bowel prep for your procedure, you may not have a normal bowel movement for a few days.  Please Note:  You might notice some irritation and congestion in your nose or some drainage.  This is from the oxygen used during your procedure.  There is no need for concern and it should clear up in a day or so.  SYMPTOMS TO REPORT IMMEDIATELY:  Following lower endoscopy (colonoscopy or flexible sigmoidoscopy):  Excessive amounts of blood in the stool  Significant tenderness or worsening of abdominal pains  Swelling of the abdomen that is new, acute  Fever of 100F or higher  For urgent or emergent issues, a gastroenterologist can be reached at any hour by calling [phone]. Do not use MyChart messaging for urgent concerns.    DIET:  We do recommend a small meal at first, but then you may proceed to your regular diet.  Drink plenty of fluids but you should avoid alcoholic beverages for 24 hours.  ACTIVITY:  You should plan to take it easy for the rest of today and you should NOT DRIVE or use heavy machinery until tomorrow (because of  the sedation medicines used during the test).    FOLLOW UP: Our staff will call the number listed on your records the next business day following your procedure.  We will call around 7:15- 8:00 am to check on you and address any questions or concerns that you may have regarding the information given to you following your procedure. If we do not reach you, we will leave a message.  If you develop any symptoms (ie: fever, flu-like symptoms, shortness of breath, cough etc.) before then, please call [phone].  If you test positive for Covid 19 in the 2 weeks post procedure, please call and report this information to us.    If any biopsies were taken you will be contacted by phone or by letter within the next 1-3 weeks.  Please call us at [phone] if you have not heard about the biopsies in 3 weeks.    SIGNATURES/CONFIDENTIALITY: You and/or your care partner have signed paperwork which will be entered into your electronic medical record.  These signatures attest to the fact that that the information above on your After Visit Summary has been reviewed and is understood.  Full responsibility of the confidentiality of this discharge information lies with you and/or your care-partner.  

## 2021-11-16 NOTE — Progress Notes (Signed)
Pt's states no medical or surgical changes since previsit or office visit. 

## 2021-11-16 NOTE — Progress Notes (Signed)
GASTROENTEROLOGY PROCEDURE H&P NOTE   Primary Care Physician: Shon Baton, MD    Reason for Procedure:  Family history of colon polyps  Plan:    Colonoscopy  Patient is appropriate for endoscopic procedure(s) in the ambulatory ([place]) setting.  The nature of the procedure, as well as the risks, benefits, and alternatives were carefully and thoroughly reviewed with the patient. Ample time for discussion and questions allowed. The patient understood, was satisfied, and agreed to proceed.     HPI: Dylan Woods is a 76 y.o. male who presents for screening colonoscopy.  Medical history as below.  Tolerated the prep.  No recent chest pain or shortness of breath.  No abdominal pain today.  Past Medical History:  Diagnosis Date   Allergy    Anemia    with prostate cancer treatment   Asthma    as a child   Bladder outlet obstruction    Degenerative arthritis    Diverticulosis    Foley catheter in place    Heart murmur    History of chest pain    per pt and epic documentation -- cardiac work-up done --- per dr Marlou Porch note felt to not be cardiac related   History of radiation therapy    boost w/ radioactive prostate seed implants, 110Gy; then IMRT  45Gy in 25 fractions , 01-20-2017 to 02-24-2017   History of urinary retention 12/23/2016   post surgery 12-18-2016   Hyperlipidemia    on crestor   Hyperplasia of prostate with lower urinary tract symptoms (LUTS)    Intermittent palpitations    mostly at night--  cardiologist visit w/ dr Marlou Porch 08-07-2017 in epic, ekg showed PACs   Internal hemorrhoids    Irritable bowel syndrome with diarrhea    Migraines    neurologist-  dr [name]   Osteoporosis    Prostate cancer [facility]) urologist-  dr dahlstedt/  oncologist-  dr [name]   dx 03-29-2016-- Stage T1c, Gleason 4+5, PSA 5.06, vol 74.51cc--- Hormone therapy (lupron injection) prostate decreased to 47cc; 12-18-2016  radiactive seed implants;  completed external beam radiation  02-24-2017 PTNS injections.     Past Surgical History:  Procedure Laterality Date   BLADDER STONE REMOVAL  2021   CARDIOVASCULAR STRESS TEST  06-19-2016   dr Marlou Porch   Intermediate risk nuclear study w/ no reversible ischemia with moderate size and intensity fixed inferoseptal and septal perfusion defect, consider artifact scar/  LVEF nuclear stress 48% (45-54%)  with septal hypokinesis to dyskinesis   CATARACT EXTRACTION W/ INTRAOCULAR LENS  IMPLANT, BILATERAL  2011;  2004   CIRCUMCISION  age 21   CYSTOSCOPY N/A 12/18/2016   Procedure: CYSTOSCOPY FLEXIBLE;  Surgeon: [name], MD;  [facility]: [facility];  Service: Urology;  Laterality: N/A;  NO SEEDS FOUND IN BLADDER   CYSTOSCOPY N/A 02/11/2020   Procedure: CYSTOSCOPY bladder stone extraction placement of foley catheter;  Surgeon: Ceasar Mons, MD;  Location: [facility];  Service: Urology;  Laterality: N/A;   CYSTOSCOPY WITH INSERTION OF UROLIFT  03/ 2019   dr [name]  w/ anesthesia @ [facility]   GOLD SEED IMPLANT N/A 12/18/2016   Procedure: GOLD SEED IMPLANT;  Surgeon: [name], MD;  Location: [facility];  Service: Urology;  Laterality: N/A;   3 MARKERS  IMPLANTED   INGUINAL HERNIA REPAIR Right 1992   KNEE ARTHROSCOPY Left 2003;  2008   LEFT HEART CATH AND CORONARY ANGIOGRAPHY N/A 07/20/2021   Procedure:  LEFT HEART CATH AND CORONARY ANGIOGRAPHY;  Surgeon: Martinique, Peter M, MD;  Location: [facility];  Service: Cardiovascular;  Laterality: N/A;   NASAL SINUS SURGERY  2015   PROSTATE BIOPSY  03-29-2016   dr Pilar Jarvis (office)   RADIOACTIVE SEED IMPLANT N/A 12/18/2016   Procedure: RADIOACTIVE SEED IMPLANT/BRACHYTHERAPY IMPLANT;  Surgeon: [name], MD;  Location: [facility];  Service: Urology;  Laterality: N/A;    62  SEEDS IMPLANTED   rt foot metatarsal Right    SPACE OAR INSTILLATION N/A 12/18/2016   Procedure: SPACE OAR INSTILLATION;   Surgeon: [name], MD;  Location: [facility];  Service: Urology;  Laterality: N/A;   TRANSURETHRAL RESECTION OF PROSTATE N/A 10/31/2017   Procedure: TRANSURETHRAL RESECTION OF THE PROSTATE (TURP);  Surgeon: [name], MD;  Location: [facility];  Service: Urology;  Laterality: N/A;   VITRECTOMY  09/2021   with lens implants    Prior to Admission medications   Medication Sig Start Date End Date Taking? Authorizing Provider  acetaminophen (TYLENOL) 500 MG tablet Take 1,000 mg by mouth every 6 (six) hours as needed for moderate pain.   Yes [provider]  alendronate (FOSAMAX) 70 MG tablet Take 1 tablet by mouth every Saturday. 07/15/18  Yes [provider]  Ascorbic Acid (VITAMIN C) 1000 MG tablet Take 1,000 mg by mouth daily.   Yes [provider]  aspirin EC 81 MG tablet Take 81 mg by mouth daily. Swallow whole.   Yes [provider]  Calcium Carbonate-Vit D-Min (CALCIUM 600+D PLUS MINERALS) 600-400 MG-UNIT TABS Take 1 tablet by mouth 2 (two) times daily.   Yes [provider]  cetirizine (ZYRTEC) 10 MG tablet Take 10 mg by mouth daily as needed for allergies.   Yes [provider]  desmopressin (DDAVP) 0.2 MG tablet Take 0.6 mg by mouth at bedtime. 05/28/18  Yes [provider]  diphenhydramine-acetaminophen (TYLENOL PM) 25-500 MG TABS tablet Take 1 tablet by mouth at bedtime as needed (sleep/pain).   Yes [provider]  metoprolol succinate (TOPROL-XL) 50 MG 24 hr tablet Take 1 tablet (50 mg total) by mouth at bedtime. Take with or immediately following a meal. 08/06/21  Yes Camnitz, Ocie Doyne, MD  OVER THE COUNTER MEDICATION Petra Kuba Made Time Release Tablets Melatonin and L-Theanine +GBA   Yes [provider]  PRESCRIPTION MEDICATION Prednisone Acetate 1% ophthalmic drops   Yes [provider]  Propylene Glycol (SYSTANE COMPLETE OP) Place 1 drop into both  eyes 3 (three) times daily.   Yes [provider]  rosuvastatin (CRESTOR) 5 MG tablet Take 5 mg by mouth daily. 03/14/21  Yes [provider]  solifenacin (VESICARE) 5 MG tablet Take 5 mg by mouth daily. 06/25/21  Yes [provider]  Vibegron (GEMTESA) 75 MG TABS Take 75 mg by mouth daily.   Yes [provider]  acetaminophen-codeine (TYLENOL #3) 300-30 MG tablet Take 1 tablet by mouth daily as needed (headaches). 04/02/19   [provider]  ALPRAZolam Duanne Moron) 0.25 MG tablet Take 0.25 mg by mouth at bedtime as needed for anxiety.    [provider]  Calcium Glycerophosphate (PRELIEF PO) Take 3-4 tablets by mouth 3 (three) times daily as needed (overactive bladder).    [provider]  doxylamine, Sleep, (UNISOM) 25 MG tablet Take 25 mg by mouth at bedtime. Patient not taking: Reported on 10/17/2021    [provider]  Ferrous Sulfate (  IRON SUPPLEMENT PO) Take 2 capsules by mouth 2 (two) times daily.    [provider]  ketoconazole (NIZORAL) 2 % cream Apply 1 application topically daily as needed for irritation.  05/06/19   [provider]  Menthol, Topical Analgesic, (BIOFREEZE EX) Apply 1 application. topically daily as needed (pain).    [provider]  METAMUCIL FIBER PO Take 1 Dose by mouth 2 (two) times daily.    [provider]  Multiple Vitamins-Minerals (ADULT GUMMY) CHEW Chew 1 capsule by mouth daily.    [provider]  OVER THE COUNTER MEDICATION Take 1.5 Scoops by mouth 2 (two) times daily. sprout living simple pea protein otc supplement powder    [provider]  valACYclovir (VALTREX) 1000 MG tablet Take 1,000 mg by mouth daily as needed (for cold sores).  11/13/15   [provider]    Current Outpatient Medications  Medication Sig Dispense Refill   acetaminophen (TYLENOL) 500 MG tablet Take 1,000 mg by mouth every 6 (six) hours as needed for moderate  pain.     alendronate (FOSAMAX) 70 MG tablet Take 1 tablet by mouth every Saturday.     Ascorbic Acid (VITAMIN C) 1000 MG tablet Take 1,000 mg by mouth daily.     aspirin EC 81 MG tablet Take 81 mg by mouth daily. Swallow whole.     Calcium Carbonate-Vit D-Min (CALCIUM 600+D PLUS MINERALS) 600-400 MG-UNIT TABS Take 1 tablet by mouth 2 (two) times daily.     cetirizine (ZYRTEC) 10 MG tablet Take 10 mg by mouth daily as needed for allergies.     desmopressin (DDAVP) 0.2 MG tablet Take 0.6 mg by mouth at bedtime.     diphenhydramine-acetaminophen (TYLENOL PM) 25-500 MG TABS tablet Take 1 tablet by mouth at bedtime as needed (sleep/pain).     metoprolol succinate (TOPROL-XL) 50 MG 24 hr tablet Take 1 tablet (50 mg total) by mouth at bedtime. Take with or immediately following a meal. 30 tablet 6   OVER THE COUNTER MEDICATION Nature Made Time Release Tablets Melatonin and L-Theanine +GBA     PRESCRIPTION MEDICATION Prednisone Acetate 1% ophthalmic drops     Propylene Glycol (SYSTANE COMPLETE OP) Place 1 drop into both eyes 3 (three) times daily.     rosuvastatin (CRESTOR) 5 MG tablet Take 5 mg by mouth daily.     solifenacin (VESICARE) 5 MG tablet Take 5 mg by mouth daily.     Vibegron (GEMTESA) 75 MG TABS Take 75 mg by mouth daily.     acetaminophen-codeine (TYLENOL #3) 300-30 MG tablet Take 1 tablet by mouth daily as needed (headaches).     ALPRAZolam (XANAX) 0.25 MG tablet Take 0.25 mg by mouth at bedtime as needed for anxiety.     Calcium Glycerophosphate (PRELIEF PO) Take 3-4 tablets by mouth 3 (three) times daily as needed (overactive bladder).     doxylamine, Sleep, (UNISOM) 25 MG tablet Take 25 mg by mouth at bedtime. (Patient not taking: Reported on 10/17/2021)     Ferrous Sulfate (IRON SUPPLEMENT PO) Take 2 capsules by mouth 2 (two) times daily.     ketoconazole (NIZORAL) 2 % cream Apply 1 application topically daily as needed for irritation.      Menthol, Topical Analgesic, (BIOFREEZE EX)  Apply 1 application. topically daily as needed (pain).     METAMUCIL FIBER PO Take 1 Dose by mouth 2 (two) times daily.     Multiple Vitamins-Minerals (ADULT GUMMY) CHEW Chew 1 capsule by  mouth daily.     OVER THE COUNTER MEDICATION Take 1.5 Scoops by mouth 2 (two) times daily. sprout living simple pea protein otc supplement powder     valACYclovir (VALTREX) 1000 MG tablet Take 1,000 mg by mouth daily as needed (for cold sores).      Current Facility-Administered Medications  Medication Dose Route Frequency Provider Last Rate Last Admin   0.9 %  sodium chloride infusion  500 mL Intravenous Once Delvon Chipps, Lajuan Lines, MD        Allergies as of 11/16/2021 - Review Complete 11/16/2021  Allergen Reaction Noted   Ampicillin Rash 02/10/2013   Ketorolac Other (See Comments) 12/23/2016   Betadine [povidone iodine] Other (See Comments) 10/29/2017   Soy allergy  10/17/2021    Family History  Problem Relation Age of Onset   Cancer Mother        lymphoma   Colon polyps Mother    Bladder Cancer Mother    Parkinsonism Father    Colon polyps Father    Cancer Brother        prostate ca s/p prostatectomy   Colon cancer Neg Hx    Esophageal cancer Neg Hx    Stomach cancer Neg Hx    Rectal cancer Neg Hx     Social History   Socioeconomic History   Marital status: Widowed    Spouse name: Not on file   Number of children: 1   Years of education: college   Highest education level: Not on file  Occupational History    Employer: [employer] ATTORN[employer] AT [employer]    Comment: Lawyer  Tobacco Use   Smoking status: Former    Years: 6.00    Types: Cigarettes    Quit date: 12/10/1969    Years since quitting: 51.9   Smokeless tobacco: Never  Vaping Use   Vaping Use: Never used  Substance and Sexual Activity   Alcohol use: Not Currently   Drug use: No   Sexual activity: Not Currently  Other Topics Concern   Not on file  Social History Narrative   Patient lives at home alone widowed.    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   Right handed.   College education.   Caffeine one cup daily coffee.   Social Determinants of Health   Financial Resource Strain: Not on file  Food Insecurity: Not on file  Transportation Needs: Not on file  Physical Activity: Not on file  Stress: Not on file  Social Connections: Not on file  Intimate Partner Violence: Not on file    Physical Exam: Vital signs in last 24 hours: '@BP'$  118/70   Pulse (!) 52   Temp (!) 95.9 F (35.5 C)   Ht 6' (1.829 m)   Wt 180 lb (81.6 kg)   SpO2 99%   BMI 24.41 kg/m  GEN: NAD EYE: Sclerae anicteric ENT: MMM CV: Non-tachycardic Pulm: CTA b/l GI: Soft, NT/ND NEURO:  Alert & Oriented x 3   Zenovia Jarred, MD [facility]  11/16/2021 8:37 AM

## 2021-11-16 NOTE — Op Note (Signed)
[facility] Patient Name: Dylan Woods Procedure Date: 11/16/2021 8:43 AM MRN: [id number] Endoscopist: Jerene Bears , MD Age: 76 Referring MD:  Date of Birth: 30-Aug-1945 Gender: Male Account #: [id number] Procedure:                Colonoscopy Indications:              Colon cancer screening in patient at increased                            risk: Family history of colon polyps in multiple                            1st-degree relatives, Last colonoscopy: 2018 Medicines:                Monitored Anesthesia Care Procedure:                Pre-Anesthesia Assessment:                           - Prior to the procedure, a History and Physical                            was performed, and patient medications and                            allergies were reviewed. The patient's tolerance of                            previous anesthesia was also reviewed. The risks                            and benefits of the procedure and the sedation                            options and risks were discussed with the patient.                            All questions were answered, and informed consent                            was obtained. Prior Anticoagulants: The patient has                            taken no previous anticoagulant or antiplatelet                            agents. ASA Grade Assessment: II - A patient with                            mild systemic disease. After reviewing the risks                            and benefits, the patient was deemed in  satisfactory condition to undergo the procedure.                           After obtaining informed consent, the colonoscope                            was passed under direct vision. Throughout the                            procedure, the patient's blood pressure, pulse, and                            oxygen saturations were monitored continuously. The                            Olympus PCF-H190DL  (#[id number]) Colonoscope was                            introduced through the anus and advanced to the                            cecum, identified by appendiceal orifice and                            ileocecal valve. The colonoscopy was performed                            without difficulty. The patient tolerated the                            procedure well. The quality of the bowel                            preparation was excellent. The ileocecal valve,                            appendiceal orifice, and rectum were photographed. Scope In: 8:46:28 AM Scope Out: 9:02:06 AM Scope Withdrawal Time: 0 hours 10 minutes 19 seconds  Total Procedure Duration: 0 hours 15 minutes 38 seconds  Findings:                 The digital rectal exam was normal.                           Multiple small and large-mouthed diverticula were                            found in the sigmoid colon, descending colon and                            distal transverse colon.                           Internal hemorrhoids were found during  retroflexion. The hemorrhoids were small.                           The exam was otherwise without abnormality. Complications:            No immediate complications. Estimated Blood Loss:     Estimated blood loss: none. Impression:               - Mild diverticulosis in the sigmoid colon, in the                            descending colon and in the distal transverse colon.                           - Internal hemorrhoids.                           - The examination was otherwise normal.                           - No specimens collected. Recommendation:           - Patient has a contact number available for                            emergencies. The signs and symptoms of potential                            delayed complications were discussed with the                            patient. Return to normal activities tomorrow.                             Written discharge instructions were provided to the                            patient.                           - Resume previous diet.                           - Continue present medications.                           - No repeat colonoscopy due to age at next                            screening interval and the absence of colonic                            polyps on today's exam. Jerene Bears, MD 11/16/2021 9:05:32 AM This report has been signed electronically.

## 2021-11-16 NOTE — Progress Notes (Signed)
VSS, transported to PACU °

## 2021-11-19 ENCOUNTER — Telehealth: Payer: Self-pay

## 2021-11-19 NOTE — Telephone Encounter (Signed)
  Follow up Call-     11/16/2021    7:42 AM  Call back number  Post procedure Call Back phone  # [phone]  Permission to leave phone message Yes     Patient questions:  Do you have a fever, pain , or abdominal swelling? No. Pain Score  0 *  Have you tolerated food without any problems? Yes.    Have you been able to return to your normal activities? Yes.    Do you have any questions about your discharge instructions: Diet   No. Medications  No. Follow up visit  No.  Do you have questions or concerns about your Care? No.  Actions: * If pain score is 4 or above: No action needed, pain <4.

## 2021-11-22 ENCOUNTER — Ambulatory Visit: Payer: PPO | Attending: Psychiatry

## 2021-11-22 DIAGNOSIS — R2681 Unsteadiness on feet: Secondary | ICD-10-CM | POA: Insufficient documentation

## 2021-11-22 DIAGNOSIS — R42 Dizziness and giddiness: Secondary | ICD-10-CM | POA: Insufficient documentation

## 2021-11-22 DIAGNOSIS — M810 Age-related osteoporosis without current pathological fracture: Secondary | ICD-10-CM | POA: Diagnosis not present

## 2021-11-22 DIAGNOSIS — R5383 Other fatigue: Secondary | ICD-10-CM | POA: Diagnosis not present

## 2021-11-22 DIAGNOSIS — E559 Vitamin D deficiency, unspecified: Secondary | ICD-10-CM | POA: Diagnosis not present

## 2021-11-22 NOTE — Therapy (Signed)
OUTPATIENT PHYSICAL THERAPY NEURO TREATMENT   Patient Name: Dylan Woods MRN: [id number] DOB:[birth year], 76 y.o., male Today's Date: 11/22/2021   PCP: Shon Baton REFERRING PROVIDER: Genia Harold, MD    PT End of Session - 11/22/21 1443     Visit Number 4    Number of Visits 8    Date for PT Re-Evaluation 01/03/22    Authorization Type [employer]    PT Start Time 1445    PT Stop Time 1530    PT Time Calculation (min) 45 min    Activity Tolerance Patient tolerated treatment well    Behavior During Therapy [facility] for tasks assessed/performed              Past Medical History:  Diagnosis Date   Allergy    Anemia    with prostate cancer treatment   Asthma    as a child   Bladder outlet obstruction    Degenerative arthritis    Diverticulosis    Foley catheter in place    Heart murmur    History of chest pain    per pt and epic documentation -- cardiac work-up done --- per dr Marlou Porch note felt to not be cardiac related   History of radiation therapy    boost w/ radioactive prostate seed implants, 110Gy; then IMRT  45Gy in 25 fractions , 01-20-2017 to 02-24-2017   History of urinary retention 12/23/2016   post surgery 12-18-2016   Hyperlipidemia    on crestor   Hyperplasia of prostate with lower urinary tract symptoms (LUTS)    Intermittent palpitations    mostly at night--  cardiologist visit w/ dr Marlou Porch 08-07-2017 in epic, ekg showed PACs   Internal hemorrhoids    Irritable bowel syndrome with diarrhea    Migraines    neurologist-  dr [name]   Osteoporosis    Prostate cancer [facility]) urologist-  dr dahlstedt/  oncologist-  dr [name]   dx 03-29-2016-- Stage T1c, Gleason 4+5, PSA 5.06, vol 74.51cc--- Hormone therapy (lupron injection) prostate decreased to 47cc; 12-18-2016  radiactive seed implants;  completed external beam radiation 02-24-2017 PTNS injections.    Past Surgical History:  Procedure Laterality Date   BLADDER STONE REMOVAL  2021    CARDIOVASCULAR STRESS TEST  06-19-2016   dr Marlou Porch   Intermediate risk nuclear study w/ no reversible ischemia with moderate size and intensity fixed inferoseptal and septal perfusion defect, consider artifact scar/  LVEF nuclear stress 48% (45-54%)  with septal hypokinesis to dyskinesis   CATARACT EXTRACTION W/ INTRAOCULAR LENS  IMPLANT, BILATERAL  2011;  2004   CIRCUMCISION  age 87   CYSTOSCOPY N/A 12/18/2016   Procedure: CYSTOSCOPY FLEXIBLE;  Surgeon: [name], MD;  Location: [facility];  Service: Urology;  Laterality: N/A;  NO SEEDS FOUND IN BLADDER   CYSTOSCOPY N/A 02/11/2020   Procedure: CYSTOSCOPY bladder stone extraction placement of foley catheter;  Surgeon: Ceasar Mons, MD;  Location: [facility];  Service: Urology;  Laterality: N/A;   CYSTOSCOPY WITH INSERTION OF UROLIFT  03/ 2019   dr [name]  w/ anesthesia @ [facility]   GOLD SEED IMPLANT N/A 12/18/2016   Procedure: GOLD SEED IMPLANT;  Surgeon: [name], MD;  Location: [facility];  Service: Urology;  Laterality: N/A;   3 MARKERS  IMPLANTED   INGUINAL HERNIA REPAIR Right 1992   KNEE ARTHROSCOPY Left 2003;  2008   LEFT HEART CATH AND CORONARY ANGIOGRAPHY N/A 07/20/2021  Procedure: LEFT HEART CATH AND CORONARY ANGIOGRAPHY;  Surgeon: Martinique, Peter M, MD;  Location: [place] [facility];  Service: Cardiovascular;  Laterality: N/A;   NASAL SINUS SURGERY  2015   PROSTATE BIOPSY  03-29-2016   dr Pilar Jarvis (office)   RADIOACTIVE SEED IMPLANT N/A 12/18/2016   Procedure: RADIOACTIVE SEED IMPLANT/BRACHYTHERAPY IMPLANT;  Surgeon: [name], MD;  Location: [facility];  Service: Urology;  Laterality: N/A;    62  SEEDS IMPLANTED   rt foot metatarsal Right    SPACE OAR INSTILLATION N/A 12/18/2016   Procedure: SPACE OAR INSTILLATION;  Surgeon: [name], MD;  Location: [facility];  Service: Urology;  Laterality: N/A;   TRANSURETHRAL  RESECTION OF PROSTATE N/A 10/31/2017   Procedure: TRANSURETHRAL RESECTION OF THE PROSTATE (TURP);  Surgeon: [name], MD;  Location: [facility];  Service: Urology;  Laterality: N/A;   VITRECTOMY  09/2021   with lens implants   Patient Active Problem List   Diagnosis Date Noted   Precordial pain 07/05/2021   Urinary retention 02/11/2020   Memory loss 07/19/2019   Chronic headaches 07/23/2018   Hyperplasia of prostate with urinary obstruction 10/31/2017   Increased urinary frequency 09/24/2017   OAB (overactive bladder) 09/24/2017   Urinary urgency 09/24/2017   PAC (premature atrial contraction) 06/17/2016   Palpitations 05/22/2016   Malignant neoplasm of prostate ([place]) 05/22/2016   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   Somnolence, daytime 12/27/2014   Common migraine 06/21/2014   Inguinal hernia, left 09/23/2013   Headache 02/10/2013    ONSET DATE: "for a while"  REFERRING DIAG: R26.89 (ICD-10-CM) - Imbalance   THERAPY DIAG:  Unsteadiness on feet  Dizziness and giddiness  Rationale for Evaluation and Treatment Rehabilitation  SUBJECTIVE:                                                                                                                                                                                              SUBJECTIVE STATEMENT:  No notable positional issues  Pt accompanied by: self  PERTINENT HISTORY: He is concerned because he has noticed worsening balance. Denies leg weakness or dizziness, just feels he is unstable. This is particularly bad when he closes his eyes in the shower. Has not had any falls   PAIN:  Are you having pain? No  PRECAUTIONS: None  WEIGHT BEARING RESTRICTIONS No  FALLS: Has patient fallen in last 6 months? No  LIVING ENVIRONMENT: Lives with: lives alone Lives in: House/apartment Stairs: Yes: Internal: 12 steps; can reach both and External: 12 steps; on right going up Has following equipment  at  home: None  PLOF: Independent  PATIENT GOALS be able to climb steps with less reliance on HR, be able to walk on trails if possilbe     OBJECTIVE:   TODAY'S TREATMENT: 11/22/21 Activity Comments  Standing VOR x 30 sec Firm/compliant Horizontal-no ill effects Vertical- no ill effect  Standing on foam EC 2x15 sec-normal/mild sway  Walking VOR 4x25 ft No ill effects  Sidestepping turning head target to target No ill effects  Stair ambulation  Requires unilat HR due to feeling unsteady  Forward step downs 5x5 4"     TODAY'S TREATMENT: 11/13/21 Activity Comments  Standing in corner balance EC x 30 sec EO+head turns x 5 reps EC+head turns x 5 reps Tandem stance x 30 sec  Standing quick head turns 10x target 8 ft apart   Walking with eyes closed 4x30 ft. Head tilt/eyes closed 3+ ft pathway deviation  VOR x 1 horizontal (seated 2x30 sec)   Standing on foam EC x 30 sec EO/EC + head turns/vertical 5x  Tandem stance on firm EO+ head turns 5x    DIAGNOSTIC FINDINGS: MRI images were reviewed (no report), brain MRI was normal and he did appear to have left foraminal narrowing on MRI C-spine. Has not had any more episodes of paresthesias and denies neck pain at this time   COGNITION: Overall cognitive status: Within functional limits for tasks assessed   SENSATION: DNT, pt reports nerve conduction test is to take place  COORDINATION: WNL  EDEMA:  none  MUSCLE TONE: WNL   POSTURE: decreased lumbar lordosis and flexed trunk   LOWER EXTREMITY ROM:     WNL   LOWER EXTREMITY MMT:    MMT Right Eval Left Eval  Hip flexion 5 5  Hip extension    Hip abduction 5 5  Hip adduction    Hip internal rotation    Hip external rotation    Knee flexion 5 5  Knee extension 5 5  Ankle dorsiflexion 5 5  Ankle plantarflexion    Ankle inversion    Ankle eversion    --Gross LE strength tested with pt in sitting  BED MOBILITY:  indep  TRANSFERS: independent   STAIRS:  Level  of Assistance: Modified independence  Stair Negotiation Technique: Alternating Pattern  with Single Rail on Right  Number of Stairs: 10   Height of Stairs: 4-6"  Comments:   GAIT: Gait pattern: WFL Distance walked:  Assistive device utilized: None Level of assistance: Complete Independence Comments:   FUNCTIONAL TESTs:  Berg Balance Scale: 53/56 Dynamic Gait Index: 21/24    Ocular assessment:  Saccades: WNL Smooth pursuits: WNL Spontaneous nystagmus: absent Head Impulse Test: +Right; -Left VOR: to be tested VOR cancellation: to be tested   M-CTSIB  Condition 1: Firm Surface, EO 30 Sec, Normal Sway  Condition 2: Firm Surface, EC 30 Sec, Normal and Mild Sway  Condition 3: Foam Surface, EO 30 Sec, Normal Sway  Condition 4: Foam Surface, EC 9 Sec, Severe Sway     TODAY'S TREATMENT:  Evaluation, standing balance for HEP   PATIENT EDUCATION: Education details: assessment findings Person educated: Patient Education method: Theatre stage manager Education comprehension: verbalized understanding   HOME EXERCISE PROGRAM: Access Code: [email or web address] URL: [email or web address] Date: 10/25/2021 Prepared by: Sherlyn Lees  Exercises - Tandem Stance with Eyes Closed in Corner  - 1 x daily - 7 x weekly - 3 sets - 30 sec hold - Corner Balance Feet Together With Eyes Closed  - 1  x daily - 7 x weekly - 3 sets - 30 sec hold - Forward Step Down  - 7 x weekly - 5 sets - 5 reps - Gaze Stability (VOR) x1 Forward Ambulation With Distant Target With Horizontal Head Turns  - 1 x daily - 7 x weekly   GOALS: Goals reviewed with patient? Yes  SHORT TERM GOALS: Target date: 11/22/2021  The patient will be independent with HEP for gaze adaptation, habituation, balance, and general mobility. Baseline: Goal status: PROGRESSING    LONG TERM GOALS: Target date: 01/03/2022  Demonstrate improved balance/vestibular function as evidenced by mild sway condition 4 M-CTSIB x 30  sec Baseline: severe with LOB x 9 sec Goal status: PROGRESSING  2.  Demonstrate improved balance and safety with gait per score 24/24 Dynamic Gait Index Baseline: 21/24, main deficits with head turns whilst walking Goal status: INITIAL  3.  Demonstrate/report improved balance per ability to ambulate 12 stairs without use of HR Baseline: single HR required Goal status: PROGRESSING    ASSESSMENT:  CLINICAL IMPRESSION: Tx focus on VOR gaze stabilization at outset without provocation of symptoms during activities and proceeded with walking VOR and performing mobility with quick head turns with pt reporting no ill effect from these movements.  Stair ambulation trial reveals pt needing unilateral UE support with notable decrease in eccentric control when descending and carrying items.  Unsure if his issues with stairs is more related to depth perception or this lack of eccentric control.  Initiated performing unilateral forward step-downs from 4" surface to see if he can build strength and confidence with this to achieve his goal of stair ambulation w/out HR. Demonstrtaes marked improvement in condition 4 M-CTSIB with normal-mild sway  OBJECTIVE IMPAIRMENTS decreased balance, decreased knowledge of condition, difficulty walking, dizziness, and postural dysfunction.   ACTIVITY LIMITATIONS carrying, stairs, and locomotion level  PARTICIPATION LIMITATIONS: community activity and yard work  PERSONAL FACTORS Age, Time since onset of injury/illness/exacerbation, and 1 comorbidity: hx of prostate CA, cardiac  are also affecting patient's functional outcome.   REHAB POTENTIAL: Excellent  CLINICAL DECISION MAKING: Evolving/moderate complexity  EVALUATION COMPLEXITY: Moderate  PLAN: PT FREQUENCY: 1x/week  PT DURATION: 10 weeks  PLANNED INTERVENTIONS: Therapeutic exercises, Therapeutic activity, Neuromuscular re-education, Balance training, Gait training, Patient/Family education, Joint  mobilization, Stair training, Vestibular training, Canalith repositioning, DME instructions, Aquatic Therapy, Dry Needling, Cryotherapy, Moist heat, Biofeedback, and Manual therapy  PLAN FOR NEXT SESSION: walk on uneven surfaces, stair ambulation, revise HEP   3:37 PM, 11/22/21 M. Sherlyn Lees, PT, DPT Physical Therapist- [place] Office Number: [phone]

## 2021-11-27 ENCOUNTER — Ambulatory Visit: Payer: PPO | Admitting: Physical Therapy

## 2021-11-27 DIAGNOSIS — R2681 Unsteadiness on feet: Secondary | ICD-10-CM | POA: Diagnosis not present

## 2021-11-27 NOTE — Therapy (Signed)
OUTPATIENT PHYSICAL THERAPY NEURO TREATMENT   Patient Name: Dylan Woods MRN: [id number] DOB:28-Apr-1945, 76 y.o., male Today's Date: 11/27/2021   PCP: Shon Baton REFERRING PROVIDER: Genia Harold, MD    PT End of Session - 11/27/21 1408     Visit Number 5    Number of Visits 8    Date for PT Re-Evaluation 01/03/22    Authorization Type [employer]    PT Start Time 1406    PT Stop Time 1444    PT Time Calculation (min) 38 min    Activity Tolerance Patient tolerated treatment well    Behavior During Therapy WFL for tasks assessed/performed               Past Medical History:  Diagnosis Date   Allergy    Anemia    with prostate cancer treatment   Asthma    as a child   Bladder outlet obstruction    Degenerative arthritis    Diverticulosis    Foley catheter in place    Heart murmur    History of chest pain    per pt and epic documentation -- cardiac work-up done --- per dr Marlou Porch note felt to not be cardiac related   History of radiation therapy    boost w/ radioactive prostate seed implants, 110Gy; then IMRT  45Gy in 25 fractions , 01-20-2017 to 02-24-2017   History of urinary retention 12/23/2016   post surgery 12-18-2016   Hyperlipidemia    on crestor   Hyperplasia of prostate with lower urinary tract symptoms (LUTS)    Intermittent palpitations    mostly at night--  cardiologist visit w/ dr Marlou Porch 08-07-2017 in epic, ekg showed PACs   Internal hemorrhoids    Irritable bowel syndrome with diarrhea    Migraines    neurologist-  dr [name]   Osteoporosis    Prostate cancer [facility]) urologist-  dr dahlstedt/  oncologist-  dr [name]   dx 03-29-2016-- Stage T1c, Gleason 4+5, PSA 5.06, vol 74.51cc--- Hormone therapy (lupron injection) prostate decreased to 47cc; 12-18-2016  radiactive seed implants;  completed external beam radiation 02-24-2017 PTNS injections.    Past Surgical History:  Procedure Laterality Date   BLADDER STONE REMOVAL  2021    CARDIOVASCULAR STRESS TEST  06-19-2016   dr Marlou Porch   Intermediate risk nuclear study w/ no reversible ischemia with moderate size and intensity fixed inferoseptal and septal perfusion defect, consider artifact scar/  LVEF nuclear stress 48% (45-54%)  with septal hypokinesis to dyskinesis   CATARACT EXTRACTION W/ INTRAOCULAR LENS  IMPLANT, BILATERAL  2011;  2004   CIRCUMCISION  age 29   CYSTOSCOPY N/A 12/18/2016   Procedure: CYSTOSCOPY FLEXIBLE;  Surgeon: [name], MD;  Location: [facility];  Service: Urology;  Laterality: N/A;  NO SEEDS FOUND IN BLADDER   CYSTOSCOPY N/A 02/11/2020   Procedure: CYSTOSCOPY bladder stone extraction placement of foley catheter;  Surgeon: Ceasar Mons, MD;  Location: [facility];  Service: Urology;  Laterality: N/A;   CYSTOSCOPY WITH INSERTION OF UROLIFT  03/ 2019   dr [name]  w/ anesthesia @ [facility]   GOLD SEED IMPLANT N/A 12/18/2016   Procedure: GOLD SEED IMPLANT;  Surgeon: [name], MD;  Location: [facility];  Service: Urology;  Laterality: N/A;   3 MARKERS  IMPLANTED   INGUINAL HERNIA REPAIR Right 1992   KNEE ARTHROSCOPY Left 2003;  2008   LEFT HEART CATH AND CORONARY ANGIOGRAPHY N/A 07/20/2021  Procedure: LEFT HEART CATH AND CORONARY ANGIOGRAPHY;  Surgeon: Martinique, Peter M, MD;  Location: [place] [facility];  Service: Cardiovascular;  Laterality: N/A;   NASAL SINUS SURGERY  2015   PROSTATE BIOPSY  03-29-2016   dr Pilar Jarvis (office)   RADIOACTIVE SEED IMPLANT N/A 12/18/2016   Procedure: RADIOACTIVE SEED IMPLANT/BRACHYTHERAPY IMPLANT;  Surgeon: [name], MD;  Location: [facility];  Service: Urology;  Laterality: N/A;    62  SEEDS IMPLANTED   rt foot metatarsal Right    SPACE OAR INSTILLATION N/A 12/18/2016   Procedure: SPACE OAR INSTILLATION;  Surgeon: [name], MD;  Location: [facility];  Service: Urology;  Laterality: N/A;   TRANSURETHRAL  RESECTION OF PROSTATE N/A 10/31/2017   Procedure: TRANSURETHRAL RESECTION OF THE PROSTATE (TURP);  Surgeon: [name], MD;  Location: [facility];  Service: Urology;  Laterality: N/A;   VITRECTOMY  09/2021   with lens implants   Patient Active Problem List   Diagnosis Date Noted   Precordial pain 07/05/2021   Urinary retention 02/11/2020   Memory loss 07/19/2019   Chronic headaches 07/23/2018   Hyperplasia of prostate with urinary obstruction 10/31/2017   Increased urinary frequency 09/24/2017   OAB (overactive bladder) 09/24/2017   Urinary urgency 09/24/2017   PAC (premature atrial contraction) 06/17/2016   Palpitations 05/22/2016   Malignant neoplasm of prostate ([place]) 05/22/2016   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   Somnolence, daytime 12/27/2014   Common migraine 06/21/2014   Inguinal hernia, left 09/23/2013   Headache 02/10/2013    ONSET DATE: "for a while"  REFERRING DIAG: R26.89 (ICD-10-CM) - Imbalance   THERAPY DIAG:  Unsteadiness on feet  Rationale for Evaluation and Treatment Rehabilitation  SUBJECTIVE:                                                                                                                                                                                              SUBJECTIVE STATEMENT:  Same old, same old.  Haven't been doing my exercises like I should.    Pt accompanied by: self  PERTINENT HISTORY: He is concerned because he has noticed worsening balance. Denies leg weakness or dizziness, just feels he is unstable. This is particularly bad when he closes his eyes in the shower. Has not had any falls   PAIN:  Are you having pain? No  PRECAUTIONS: None  WEIGHT BEARING RESTRICTIONS No  FALLS: Has patient fallen in last 6 months? No  LIVING ENVIRONMENT: Lives with: lives alone Lives in: House/apartment Stairs: Yes: Internal: 12 steps; can reach both and External: 12 steps;  on right going up Has  following equipment at home: None  PLOF: Independent  PATIENT GOALS be able to climb steps with less reliance on HR, be able to walk on trails if possilbe     OBJECTIVE:    TODAY'S TREATMENT: 11/27/2021 Activity Comments  Stair negotiation, multiple reps with L handrail Step through pattern, cues for "kick to clear foot, heelstrike first" with descending steps.  Pt with improved stability this way and heel doesn't catch on the previous step.  Cues for step-to pattern if no rails available.  Forward step ups, 10 reps each leg, LUE support, to 4" aerobic step, then 10 reps to 6" step Cues to keep full support with LUE to maintain stability in SLS  Forward step downs, 10 reps from 4" step, then 5 reps x 2, from 6" step, LUE support at rail Cues for foot clearance/heelstrike  Resisted sidestepping with green theraband 2 reps R and L at counter Cues for eccentric control of trailing foot  Forward/back resisted monster walk, green theraband 2 reps with counter support   Single limb stance with intermittent UE support, 3 x 10 sec Cues for visual target and abdominal activation  NuStep, Level 4, BLEs only x 2 minutes Pt c/o knee pain, so discontinued use of NuStep   Access Code: [email or web address] URL: [email or web address] Date: 11/27/2021-last update Prepared by: [facility]  Exercises - Tandem Stance with Eyes Closed in Corner  - 1 x daily - 7 x weekly - 3 sets - 30 sec hold - Corner Balance Feet Together With Eyes Closed  - 1 x daily - 7 x weekly - 3 sets - 30 sec hold - Forward Step Down with Heel Tap and Counter Support  - 1 x daily - 5 x weekly - 2 sets - 10 reps - Forward Step Up  - 1 x daily - 5 x weekly - 2 sets - 10 reps - Side Stepping with Resistance at Ankles and Counter Support  - 1 x daily - 5 x weekly - 1 sets - 3-5 reps  PATIENT EDUCATION: Education details: Updates/additions to [employer] Person educated: Patient Education method:  Consulting civil engineer, Media planner, and Handouts Education comprehension: verbalized understanding and returned demonstration   ----------------------------------------------------------------------------------- FROM EVAL:   DIAGNOSTIC FINDINGS: MRI images were reviewed (no report), brain MRI was normal and he did appear to have left foraminal narrowing on MRI C-spine. Has not had any more episodes of paresthesias and denies neck pain at this time   COGNITION: Overall cognitive status: Within functional limits for tasks assessed   SENSATION: DNT, pt reports nerve conduction test is to take place  COORDINATION: WNL  EDEMA:  none  MUSCLE TONE: WNL   POSTURE: decreased lumbar lordosis and flexed trunk   LOWER EXTREMITY ROM:     WNL   LOWER EXTREMITY MMT:    MMT Right Eval Left Eval  Hip flexion 5 5  Hip extension    Hip abduction 5 5  Hip adduction    Hip internal rotation    Hip external rotation    Knee flexion 5 5  Knee extension 5 5  Ankle dorsiflexion 5 5  Ankle plantarflexion    Ankle inversion    Ankle eversion    --Gross LE strength tested with pt in sitting  BED MOBILITY:  indep  TRANSFERS: independent   STAIRS:  Level of Assistance: Modified independence  Stair Negotiation Technique: Alternating Pattern  with Single Rail on Right  Number of Stairs: 10   Height of Stairs: 4-6"  Comments:   GAIT: Gait pattern: WFL Distance walked:  Assistive device utilized: None Level of assistance: Complete Independence Comments:   FUNCTIONAL TESTs:  Berg Balance Scale: 53/56 Dynamic Gait Index: 21/24    Ocular assessment:  Saccades: WNL Smooth pursuits: WNL Spontaneous nystagmus: absent Head Impulse Test: +Right; -Left VOR: to be tested VOR cancellation: to be tested   M-CTSIB  Condition 1: Firm Surface, EO 30 Sec, Normal Sway  Condition 2: Firm Surface, EC 30 Sec, Normal and Mild Sway  Condition 3: Foam Surface, EO 30 Sec, Normal Sway  Condition  4: Foam Surface, EC 9 Sec, Severe Sway     TODAY'S TREATMENT:  Evaluation, standing balance for HEP   PATIENT EDUCATION: Education details: assessment findings Person educated: Patient Education method: Theatre stage manager Education comprehension: verbalized understanding   HOME EXERCISE PROGRAM: Access Code: [email or web address] URL: [email or web address] Date: 10/25/2021 Prepared by: Sherlyn Lees  Exercises - Tandem Stance with Eyes Closed in Corner  - 1 x daily - 7 x weekly - 3 sets - 30 sec hold - Corner Balance Feet Together With Eyes Closed  - 1 x daily - 7 x weekly - 3 sets - 30 sec hold - Forward Step Down  - 7 x weekly - 5 sets - 5 reps - Gaze Stability (VOR) x1 Forward Ambulation With Distant Target With Horizontal Head Turns  - 1 x daily - 7 x weekly -------------------------------------------------------------------------------------------------  GOALS: Goals reviewed with patient? Yes  SHORT TERM GOALS: Target date: 11/22/2021  The patient will be independent with HEP for gaze adaptation, habituation, balance, and general mobility. Baseline: Goal status: PROGRESSING    LONG TERM GOALS: Target date: 01/03/2022  Demonstrate improved balance/vestibular function as evidenced by mild sway condition 4 M-CTSIB x 30 sec Baseline: severe with LOB x 9 sec Goal status: PROGRESSING  2.  Demonstrate improved balance and safety with gait per score 24/24 Dynamic Gait Index Baseline: 21/24, main deficits with head turns whilst walking Goal status: INITIAL  3.  Demonstrate/report improved balance per ability to ambulate 12 stairs without use of HR Baseline: single HR required Goal status: PROGRESSING    ASSESSMENT:  CLINICAL IMPRESSION: Skilled PT session today focused on stair negotiation with step ups and step downs.  With breaking down this activity, pt has difficulty with SLS without light UE support, indicating decreased hip stability and strength.   Focused on functional strengthening for step ups and step downs as well as hip strengthening with use of theraband to aid in hip stability.  With cues for exaggerated foot clearance with descending steps, pt able to demonstrate improved stability and safety with stair negotiation.  He will continue to need practice with this.    OBJECTIVE IMPAIRMENTS decreased balance, decreased knowledge of condition, difficulty walking, dizziness, and postural dysfunction.   ACTIVITY LIMITATIONS carrying, stairs, and locomotion level  PARTICIPATION LIMITATIONS: community activity and yard work  PERSONAL FACTORS Age, Time since onset of injury/illness/exacerbation, and 1 comorbidity: hx of prostate CA, cardiac  are also affecting patient's functional outcome.   REHAB POTENTIAL: Excellent  CLINICAL DECISION MAKING: Evolving/moderate complexity  EVALUATION COMPLEXITY: Moderate  PLAN: PT FREQUENCY: 1x/week  PT DURATION: 10 weeks  PLANNED INTERVENTIONS: Therapeutic exercises, Therapeutic activity, Neuromuscular re-education, Balance training, Gait training, Patient/Family education, Joint mobilization, Stair training, Vestibular training, Canalith repositioning, DME instructions, Aquatic Therapy, Dry Needling, Cryotherapy, Moist heat, Biofeedback, and Manual therapy  PLAN FOR NEXT SESSION: Single limb stance,  hip stability progression of HEP, walk on uneven surfaces, stair ambulation. How did step ups/step downs as HEP go?   Mady Haagensen, PT 11/27/21 2:56 PM Phone: [phone] Fax: [phone]   [facility] [address], [address] [place], [place] [address] Phone # [phone] Fax # [phone]

## 2021-11-28 DIAGNOSIS — G609 Hereditary and idiopathic neuropathy, unspecified: Secondary | ICD-10-CM | POA: Diagnosis not present

## 2021-12-04 ENCOUNTER — Encounter: Payer: Self-pay | Admitting: Psychiatry

## 2021-12-04 NOTE — Telephone Encounter (Signed)
I haven't received it. Can we get the office to re-send it?

## 2021-12-05 ENCOUNTER — Ambulatory Visit: Payer: PPO

## 2021-12-05 ENCOUNTER — Telehealth: Payer: Self-pay | Admitting: Cardiology

## 2021-12-05 DIAGNOSIS — R42 Dizziness and giddiness: Secondary | ICD-10-CM

## 2021-12-05 DIAGNOSIS — R2681 Unsteadiness on feet: Secondary | ICD-10-CM

## 2021-12-05 NOTE — Telephone Encounter (Signed)
   Name: Dylan Woods  DOB: 04-Aug-1945  MRN: [id number]  Primary Cardiologist: [name], MD   Preoperative team, please contact this patient and set up a phone call appointment for further preoperative risk assessment. Please obtain consent and complete medication review. Thank you for your help.  I confirm that guidance regarding antiplatelet and oral anticoagulation therapy has been completed and, if necessary, noted below.  Per office protocol, if patient is without any new symptoms or concerns at the time of his virtual visit, he may hold Apirin for 7 days prior to procedure. Please resume Aspirin as soon as possible postprocedure, at the discretion of the surgeon.    Lenna Sciara, NP 12/05/2021, 1:32 PM [place]

## 2021-12-05 NOTE — Therapy (Signed)
OUTPATIENT PHYSICAL THERAPY NEURO TREATMENT   Patient Name: Dylan Woods MRN: [id number] DOB:[birth year], 76 y.o., male Today's Date: 12/05/2021   PCP: Shon Baton REFERRING PROVIDER: Genia Harold, MD    PT End of Session - 12/05/21 1451     Visit Number 6    Number of Visits 8    Date for PT Re-Evaluation 01/03/22    Authorization Type [employer]    PT Start Time 1445    PT Stop Time 1530    PT Time Calculation (min) 45 min    Activity Tolerance Patient tolerated treatment well    Behavior During Therapy [facility] for tasks assessed/performed               Past Medical History:  Diagnosis Date   Allergy    Anemia    with prostate cancer treatment   Asthma    as a child   Bladder outlet obstruction    Degenerative arthritis    Diverticulosis    Foley catheter in place    Heart murmur    History of chest pain    per pt and epic documentation -- cardiac work-up done --- per dr Marlou Porch note felt to not be cardiac related   History of radiation therapy    boost w/ radioactive prostate seed implants, 110Gy; then IMRT  45Gy in 25 fractions , 01-20-2017 to 02-24-2017   History of urinary retention 12/23/2016   post surgery 12-18-2016   Hyperlipidemia    on crestor   Hyperplasia of prostate with lower urinary tract symptoms (LUTS)    Intermittent palpitations    mostly at night--  cardiologist visit w/ dr Marlou Porch 08-07-2017 in epic, ekg showed PACs   Internal hemorrhoids    Irritable bowel syndrome with diarrhea    Migraines    neurologist-  dr [name]   Osteoporosis    Prostate cancer [facility]) urologist-  dr dahlstedt/  oncologist-  dr [name]   dx 03-29-2016-- Stage T1c, Gleason 4+5, PSA 5.06, vol 74.51cc--- Hormone therapy (lupron injection) prostate decreased to 47cc; 12-18-2016  radiactive seed implants;  completed external beam radiation 02-24-2017 PTNS injections.    Past Surgical History:  Procedure Laterality Date   BLADDER STONE REMOVAL  2021    CARDIOVASCULAR STRESS TEST  06-19-2016   dr Marlou Porch   Intermediate risk nuclear study w/ no reversible ischemia with moderate size and intensity fixed inferoseptal and septal perfusion defect, consider artifact scar/  LVEF nuclear stress 48% (45-54%)  with septal hypokinesis to dyskinesis   CATARACT EXTRACTION W/ INTRAOCULAR LENS  IMPLANT, BILATERAL  2011;  2004   CIRCUMCISION  age 30   CYSTOSCOPY N/A 12/18/2016   Procedure: CYSTOSCOPY FLEXIBLE;  Surgeon: [name], MD;  Location: [facility];  Service: Urology;  Laterality: N/A;  NO SEEDS FOUND IN BLADDER   CYSTOSCOPY N/A 02/11/2020   Procedure: CYSTOSCOPY bladder stone extraction placement of foley catheter;  Surgeon: Ceasar Mons, MD;  Location: [facility];  Service: Urology;  Laterality: N/A;   CYSTOSCOPY WITH INSERTION OF UROLIFT  03/ 2019   dr [name]  w/ anesthesia @ [facility]   GOLD SEED IMPLANT N/A 12/18/2016   Procedure: GOLD SEED IMPLANT;  Surgeon: [name], MD;  Location: [facility];  Service: Urology;  Laterality: N/A;   3 MARKERS  IMPLANTED   INGUINAL HERNIA REPAIR Right 1992   KNEE ARTHROSCOPY Left 2003;  2008   LEFT HEART CATH AND CORONARY ANGIOGRAPHY N/A 07/20/2021  Procedure: LEFT HEART CATH AND CORONARY ANGIOGRAPHY;  Surgeon: Martinique, Peter M, MD;  Location: [facility];  Service: Cardiovascular;  Laterality: N/A;   NASAL SINUS SURGERY  2015   PROSTATE BIOPSY  03-29-2016   dr Pilar Jarvis (office)   RADIOACTIVE SEED IMPLANT N/A 12/18/2016   Procedure: RADIOACTIVE SEED IMPLANT/BRACHYTHERAPY IMPLANT;  Surgeon: [name], MD;  Location: [facility];  Service: Urology;  Laterality: N/A;    62  SEEDS IMPLANTED   rt foot metatarsal Right    SPACE OAR INSTILLATION N/A 12/18/2016   Procedure: SPACE OAR INSTILLATION;  Surgeon: [name], MD;  Location: [facility];  Service: Urology;  Laterality: N/A;   TRANSURETHRAL  RESECTION OF PROSTATE N/A 10/31/2017   Procedure: TRANSURETHRAL RESECTION OF THE PROSTATE (TURP);  Surgeon: [name], MD;  Location: [facility];  Service: Urology;  Laterality: N/A;   VITRECTOMY  09/2021   with lens implants   Patient Active Problem List   Diagnosis Date Noted   Precordial pain 07/05/2021   Urinary retention 02/11/2020   Memory loss 07/19/2019   Chronic headaches 07/23/2018   Hyperplasia of prostate with urinary obstruction 10/31/2017   Increased urinary frequency 09/24/2017   OAB (overactive bladder) 09/24/2017   Urinary urgency 09/24/2017   PAC (premature atrial contraction) 06/17/2016   Palpitations 05/22/2016   Malignant neoplasm of prostate ([place]) 05/22/2016   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   Somnolence, daytime 12/27/2014   Common migraine 06/21/2014   Inguinal hernia, left 09/23/2013   Headache 02/10/2013    ONSET DATE: "for a while"  REFERRING DIAG: R26.89 (ICD-10-CM) - Imbalance   THERAPY DIAG:  Unsteadiness on feet  Dizziness and giddiness  Rationale for Evaluation and Treatment Rehabilitation  SUBJECTIVE:                                                                                                                                                                                              SUBJECTIVE STATEMENT:  not experiencing dizziness or wooziness, just balance issues. Had nerve conduction study on BLE   Pt accompanied by: self  PERTINENT HISTORY: He is concerned because he has noticed worsening balance. Denies leg weakness or dizziness, just feels he is unstable. This is particularly bad when he closes his eyes in the shower. Has not had any falls   PAIN:  Are you having pain? No  PRECAUTIONS: None  WEIGHT BEARING RESTRICTIONS No  FALLS: Has patient fallen in last 6 months? No  LIVING ENVIRONMENT: Lives with: lives alone Lives in: House/apartment Stairs: Yes: Internal: 12 steps; can reach both  and External: 12 steps; on right going up Has following equipment at home: None  PLOF: Independent  PATIENT GOALS be able to climb steps with less reliance on HR, be able to walk on trails if possilbe     OBJECTIVE:    TODAY'S TREATMENT: 12/05/21 Activity Comments  Stair ambulation No HR   3-way toe taps (lower quarter reach combo) 5 reps Ground level, 2" box, 4" box  Mini-squats 2x10 10# front rack position  March unilat carry 10#            TODAY'S TREATMENT: 11/27/2021 Activity Comments  Stair negotiation, multiple reps with L handrail Step through pattern, cues for "kick to clear foot, heelstrike first" with descending steps.  Pt with improved stability this way and heel doesn't catch on the previous step.  Cues for step-to pattern if no rails available.  Forward step ups, 10 reps each leg, LUE support, to 4" aerobic step, then 10 reps to 6" step Cues to keep full support with LUE to maintain stability in SLS  Forward step downs, 10 reps from 4" step, then 5 reps x 2, from 6" step, LUE support at rail Cues for foot clearance/heelstrike  Resisted sidestepping with green theraband 2 reps R and L at counter Cues for eccentric control of trailing foot  Forward/back resisted monster walk, green theraband 2 reps with counter support   Single limb stance with intermittent UE support, 3 x 10 sec Cues for visual target and abdominal activation  NuStep, Level 4, BLEs only x 2 minutes Pt c/o knee pain, so discontinued use of NuStep   Access Code: [email or web address] URL: [email or web address] Date: 11/27/2021-last update Prepared by: [facility]  Exercises - Tandem Stance with Eyes Closed in Corner  - 1 x daily - 7 x weekly - 3 sets - 30 sec hold - Corner Balance Feet Together With Eyes Closed  - 1 x daily - 7 x weekly - 3 sets - 30 sec hold - Forward Step Down with Heel Tap and Counter Support  - 1 x daily - 5 x weekly - 2 sets - 10 reps -  Forward Step Up  - 1 x daily - 5 x weekly - 2 sets - 10 reps - Side Stepping with Resistance at Ankles and Counter Support  - 1 x daily - 5 x weekly - 1 sets - 3-5 reps  PATIENT EDUCATION: Education details: Updates/additions to [employer] Person educated: Patient Education method: Consulting civil engineer, Media planner, and Handouts Education comprehension: verbalized understanding and returned demonstration   ----------------------------------------------------------------------------------- FROM EVAL:   DIAGNOSTIC FINDINGS: MRI images were reviewed (no report), brain MRI was normal and he did appear to have left foraminal narrowing on MRI C-spine. Has not had any more episodes of paresthesias and denies neck pain at this time   COGNITION: Overall cognitive status: Within functional limits for tasks assessed   SENSATION: DNT, pt reports nerve conduction test is to take place  COORDINATION: WNL  EDEMA:  none  MUSCLE TONE: WNL   POSTURE: decreased lumbar lordosis and flexed trunk   LOWER EXTREMITY ROM:     WNL   LOWER EXTREMITY MMT:    MMT Right Eval Left Eval  Hip flexion 5 5  Hip extension    Hip abduction 5 5  Hip adduction    Hip internal rotation    Hip external rotation    Knee flexion 5 5  Knee extension 5 5  Ankle dorsiflexion 5 5  Ankle plantarflexion    Ankle inversion    Ankle eversion    --Gross LE strength tested with pt in sitting  BED MOBILITY:  indep  TRANSFERS: independent   STAIRS:  Level of Assistance: Modified independence  Stair Negotiation Technique: Alternating Pattern  with Single Rail on Right  Number of Stairs: 10   Height of Stairs: 4-6"  Comments:   GAIT: Gait pattern: WFL Distance walked:  Assistive device utilized: None Level of assistance: Complete Independence Comments:   FUNCTIONAL TESTs:  Berg Balance Scale: 53/56 Dynamic Gait Index: 21/24    Ocular assessment:  Saccades: WNL Smooth pursuits: WNL Spontaneous nystagmus:  absent Head Impulse Test: +Right; -Left VOR: to be tested VOR cancellation: to be tested   M-CTSIB  Condition 1: Firm Surface, EO 30 Sec, Normal Sway  Condition 2: Firm Surface, EC 30 Sec, Normal and Mild Sway  Condition 3: Foam Surface, EO 30 Sec, Normal Sway  Condition 4: Foam Surface, EC 9 Sec, Severe Sway     TODAY'S TREATMENT:  Evaluation, standing balance for HEP   PATIENT EDUCATION: Education details: assessment findings Person educated: Patient Education method: Theatre stage manager Education comprehension: verbalized understanding   HOME EXERCISE PROGRAM: Access Code: [email or web address] URL: [email or web address] Date: 10/25/2021 Prepared by: Sherlyn Lees  Exercises - Tandem Stance with Eyes Closed in Corner  - 1 x daily - 7 x weekly - 3 sets - 30 sec hold - Corner Balance Feet Together With Eyes Closed  - 1 x daily - 7 x weekly - 3 sets - 30 sec hold - Forward Step Down  - 7 x weekly - 5 sets - 5 reps - Gaze Stability (VOR) x1 Forward Ambulation With Distant Target With Horizontal Head Turns  - 1 x daily - 7 x weekly -------------------------------------------------------------------------------------------------  GOALS: Goals reviewed with patient? Yes  SHORT TERM GOALS: Target date: 11/22/2021  The patient will be independent with HEP for gaze adaptation, habituation, balance, and general mobility. Baseline: Goal status: PROGRESSING    LONG TERM GOALS: Target date: 01/03/2022  Demonstrate improved balance/vestibular function as evidenced by mild sway condition 4 M-CTSIB x 30 sec Baseline: severe with LOB x 9 sec Goal status: PROGRESSING  2.  Demonstrate improved balance and safety with gait per score 24/24 Dynamic Gait Index Baseline: 21/24, main deficits with head turns whilst walking Goal status: INITIAL  3.  Demonstrate/report improved balance per ability to ambulate 12 stairs without use of HR Baseline: single HR required Goal status:  PROGRESSING    ASSESSMENT:  CLINICAL IMPRESSION: Reports no issues with vestibular activities and none seem to provoke symptoms.  Difficulty with step down activity with report of anterior left knee discomfort with report of hx of knee scope x 2.  I would suspect this is partially his reason for difficulty in descending stairs due to patellofemoral issues. Activities initiated to improve balance, LE strength, core engagment and SLS  OBJECTIVE IMPAIRMENTS decreased balance, decreased knowledge of condition, difficulty walking, dizziness, and postural dysfunction.   ACTIVITY LIMITATIONS carrying, stairs, and locomotion level  PARTICIPATION LIMITATIONS: community activity and yard work  PERSONAL FACTORS Age, Time since onset of injury/illness/exacerbation, and 1 comorbidity: hx of prostate CA, cardiac  are also affecting patient's functional outcome.   REHAB POTENTIAL: Excellent  CLINICAL DECISION MAKING: Evolving/moderate complexity  EVALUATION COMPLEXITY: Moderate  PLAN: PT FREQUENCY: 1x/week  PT DURATION: 10 weeks  PLANNED INTERVENTIONS: Therapeutic exercises, Therapeutic activity, Neuromuscular re-education, Balance training, Gait training, Patient/Family education, Joint mobilization, Stair training, Vestibular  training, Canalith repositioning, DME instructions, Aquatic Therapy, Dry Needling, Cryotherapy, Moist heat, Biofeedback, and Manual therapy  PLAN FOR NEXT SESSION: Single limb stance, hip stability progression of HEP, walk on uneven surfaces, stair ambulation. How did step ups/step downs as HEP go?  3:45 PM, 12/05/21 M. Sherlyn Lees, PT, DPT Physical Therapist- [place] Office Number: [phone]    [place] at [facility] [address] [place], [place] [address] Phone # [phone] Fax # [phone]

## 2021-12-05 NOTE — Progress Notes (Signed)
Patient will need cardiac clearance before procedure scheduled at [facility] on August 28th. Message left with Se'lita at [facility].

## 2021-12-05 NOTE — Telephone Encounter (Signed)
   Pre-operative Risk Assessment    Patient Name: Dylan Woods  DOB: Sep 27, 1945 MRN: [id number]      Request for Surgical Clearance    Procedure:    Cystolitholapaxy and urethral dilation   Date of Surgery:  Clearance 12/17/21                                 Surgeon:  Dr. [name] Surgeon's Group or Practice Name:  [facility]  Phone number:  [phone]  Fax number:  [phone]   Type of Clearance Requested:   - Medical  - Pharmacy:  Hold Aspirin 5 days prior   Type of Anesthesia:  General    Additional requests/questions:    Dorthey Sawyer   12/05/2021, 9:52 AM

## 2021-12-05 NOTE — Telephone Encounter (Signed)
Left message to call back for tele pre op appt 

## 2021-12-06 ENCOUNTER — Telehealth: Payer: Self-pay | Admitting: *Deleted

## 2021-12-06 NOTE — Telephone Encounter (Signed)
Pt has been scheduled for a tele visit, 12/11/21.  Consent on file / medications reconciled.    Patient Consent for Virtual Visit        Dylan Woods has provided verbal consent on 12/06/2021 for a virtual visit (video or telephone).   CONSENT FOR VIRTUAL VISIT FOR:  Dylan Woods  By participating in this virtual visit I agree to the following:  I hereby voluntarily request, consent and authorize [facility] and its employed or contracted physicians, physician assistants, nurse practitioners or other licensed health care professionals (the Practitioner), to provide me with telemedicine health care services (the "Services") as deemed necessary by the treating Practitioner. I acknowledge and consent to receive the Services by the Practitioner via telemedicine. I understand that the telemedicine visit will involve communicating with the Practitioner through live audiovisual communication technology and the disclosure of certain medical information by electronic transmission. I acknowledge that I have been given the opportunity to request an in-person assessment or other available alternative prior to the telemedicine visit and am voluntarily participating in the telemedicine visit.  I understand that I have the right to withhold or withdraw my consent to the use of telemedicine in the course of my care at any time, without affecting my right to future care or treatment, and that the Practitioner or I may terminate the telemedicine visit at any time. I understand that I have the right to inspect all information obtained and/or recorded in the course of the telemedicine visit and may receive copies of available information for a reasonable fee.  I understand that some of the potential risks of receiving the Services via telemedicine include:  Delay or interruption in medical evaluation due to technological equipment failure or disruption; Information transmitted may not be sufficient (e.g. poor  resolution of images) to allow for appropriate medical decision making by the Practitioner; and/or  In rare instances, security protocols could fail, causing a breach of personal health information.  Furthermore, I acknowledge that it is my responsibility to provide information about my medical history, conditions and care that is complete and accurate to the best of my ability. I acknowledge that Practitioner's advice, recommendations, and/or decision may be based on factors not within their control, such as incomplete or inaccurate data provided by me or distortions of diagnostic images or specimens that may result from electronic transmissions. I understand that the practice of medicine is not an exact science and that Practitioner makes no warranties or guarantees regarding treatment outcomes. I acknowledge that a copy of this consent can be made available to me via my patient portal ([facility]), or I can request a printed copy by calling the office of [facility].    I understand that my insurance will be billed for this visit.   I have read or had this consent read to me. I understand the contents of this consent, which adequately explains the benefits and risks of the Services being provided via telemedicine.  I have been provided ample opportunity to ask questions regarding this consent and the Services and have had my questions answered to my satisfaction. I give my informed consent for the services to be provided through the use of telemedicine in my medical care

## 2021-12-06 NOTE — Telephone Encounter (Signed)
Pt has been scheduled for a tele visit, 12/11/21.

## 2021-12-11 ENCOUNTER — Encounter (HOSPITAL_BASED_OUTPATIENT_CLINIC_OR_DEPARTMENT_OTHER): Payer: Self-pay | Admitting: Urology

## 2021-12-11 ENCOUNTER — Ambulatory Visit (INDEPENDENT_AMBULATORY_CARE_PROVIDER_SITE_OTHER): Payer: PPO | Admitting: General Practice

## 2021-12-11 ENCOUNTER — Other Ambulatory Visit: Payer: Self-pay

## 2021-12-11 DIAGNOSIS — Z01810 Encounter for preprocedural cardiovascular examination: Secondary | ICD-10-CM

## 2021-12-11 NOTE — Progress Notes (Signed)
Spoke w/ via phone for pre-op interview---pt Lab needs dos----      I stat         Lab results------ COVID test -----patient states asymptomatic no test needed Arrive at -------730 am 12-17-2021 NPO after MN NO Solid Food.  Clear liquids from MN until---630 am Med rec completed Medications to take morning of surgery -----eye drops, rosuvastatin, vesicare, gemtesa Diabetic medication -----n/a Patient instructed no nail polish to be worn day of surgery Patient instructed to bring photo id and insurance card day of surgery Patient aware to have Driver (ride ) / caregiver   daughter Zara Chess  for 24 hours after surgery  Patient Special Instructions -----none Pre-Op special Istructions -----none Patient verbalized understanding of instructions that were given at this phone interview. Patient denies  any cardiac s   & s, shortness of breath, chest pain, fever, cough at this phone interview.   Cardiac clearance note/note to stop 81 mg asa 7 days before surgery jesse cleaver np 12-11-2021/chart/epic, pt aware and stated he was told per dr [name] to  stop 81 mg aspirin 5 days before surgery, last 81 mg asa dose  was 12-10-2021 per pt  Ekg 08-06-2021 chart/epic Echo complete bubble study 06-05-2021 chart/epic Cardiac cath 07-20-2021 epic Lov dr skains 08-06-2021 epic  Lov dr skains 07-05-2021 epic Heart monitor report 05-31-2021 epic Stress test 06-19-2016 epic  Neurology lov dr [name] 02-01-2021 epic

## 2021-12-11 NOTE — Progress Notes (Signed)
Virtual Visit via Telephone Note   Because of Dylan Woods's co-morbid illnesses, he is at least at moderate risk for complications without adequate follow up.  This format is felt to be most appropriate for this patient at this time.  The patient did not have access to video technology/had technical difficulties with video requiring transitioning to audio format only (telephone).  All issues noted in this document were discussed and addressed.  No physical exam could be performed with this format.  Please refer to the patient's chart for his consent to telehealth for [facility].  Evaluation Performed:  Preoperative cardiovascular risk assessment _____________   Date:  12/11/2021   Patient ID:  Dylan Woods, DOB [birth year], MRN [id number] Patient Location:  Home Provider location:   Office  Primary Care Provider:  Shon Baton, MD Primary Cardiologist:  [name], MD  Chief Complaint / Patient Profile   76 y.o. y/o male with a h/o PACs, carotid stenosis, HLD who is pending cystoscopy and presents today for telephonic preoperative cardiovascular risk assessment.  Past Medical History    Past Medical History:  Diagnosis Date   Allergy    Anemia    with prostate cancer treatment   Asthma    as a child   Bladder outlet obstruction    Degenerative arthritis    Diverticulosis    Foley catheter in place    Heart murmur    History of chest pain    per pt and epic documentation -- cardiac work-up done --- per dr Marlou Porch note felt to not be cardiac related   History of radiation therapy    boost w/ radioactive prostate seed implants, 110Gy; then IMRT  45Gy in 25 fractions , 01-20-2017 to 02-24-2017   History of urinary retention 12/23/2016   post surgery 12-18-2016   Hyperlipidemia    on crestor   Hyperplasia of prostate with lower urinary tract symptoms (LUTS)    Intermittent palpitations    mostly at night--  cardiologist visit w/ dr Marlou Porch 08-07-2017 in epic, ekg  showed PACs   Internal hemorrhoids    Irritable bowel syndrome with diarrhea    Migraines    neurologist-  dr [name]   Osteoporosis    Prostate cancer [facility]) urologist-  dr dahlstedt/  oncologist-  dr [name]   dx 03-29-2016-- Stage T1c, Gleason 4+5, PSA 5.06, vol 74.51cc--- Hormone therapy (lupron injection) prostate decreased to 47cc; 12-18-2016  radiactive seed implants;  completed external beam radiation 02-24-2017 PTNS injections.    Past Surgical History:  Procedure Laterality Date   BLADDER STONE REMOVAL  2021   CARDIOVASCULAR STRESS TEST  06-19-2016   dr Marlou Porch   Intermediate risk nuclear study w/ no reversible ischemia with moderate size and intensity fixed inferoseptal and septal perfusion defect, consider artifact scar/  LVEF nuclear stress 48% (45-54%)  with septal hypokinesis to dyskinesis   CATARACT EXTRACTION W/ INTRAOCULAR LENS  IMPLANT, BILATERAL  2011;  2004   CIRCUMCISION  age 18   CYSTOSCOPY N/A 12/18/2016   Procedure: CYSTOSCOPY FLEXIBLE;  Surgeon: [name], MD;  Location: [facility];  Service: Urology;  Laterality: N/A;  NO SEEDS FOUND IN BLADDER   CYSTOSCOPY N/A 02/11/2020   Procedure: CYSTOSCOPY bladder stone extraction placement of foley catheter;  Surgeon: Ceasar Mons, MD;  Location: [facility];  Service: Urology;  Laterality: N/A;   CYSTOSCOPY WITH INSERTION OF UROLIFT  03/ 2019   dr [name]  w/ anesthesia @ [facility]  GOLD SEED IMPLANT N/A 12/18/2016   Procedure: GOLD SEED IMPLANT;  Surgeon: [name], MD;  Location: [facility];  Service: Urology;  Laterality: N/A;   3 MARKERS  IMPLANTED   INGUINAL HERNIA REPAIR Right 1992   KNEE ARTHROSCOPY Left 2003;  2008   LEFT HEART CATH AND CORONARY ANGIOGRAPHY N/A 07/20/2021   Procedure: LEFT HEART CATH AND CORONARY ANGIOGRAPHY;  Surgeon: Martinique, Peter M, MD;  Location: [place] [facility];  Service: Cardiovascular;  Laterality: N/A;   NASAL SINUS  SURGERY  2015   PROSTATE BIOPSY  03-29-2016   dr Pilar Jarvis (office)   RADIOACTIVE SEED IMPLANT N/A 12/18/2016   Procedure: RADIOACTIVE SEED IMPLANT/BRACHYTHERAPY IMPLANT;  Surgeon: [name], MD;  Location: [facility];  Service: Urology;  Laterality: N/A;    62  SEEDS IMPLANTED   rt foot metatarsal Right    SPACE OAR INSTILLATION N/A 12/18/2016   Procedure: SPACE OAR INSTILLATION;  Surgeon: [name], MD;  Location: [facility];  Service: Urology;  Laterality: N/A;   TRANSURETHRAL RESECTION OF PROSTATE N/A 10/31/2017   Procedure: TRANSURETHRAL RESECTION OF THE PROSTATE (TURP);  Surgeon: [name], MD;  Location: [facility];  Service: Urology;  Laterality: N/A;   VITRECTOMY  09/2021   with lens implants    Allergies  Allergies  Allergen Reactions   Ampicillin Rash    Has patient had a PCN reaction causing immediate rash, facial/tongue/throat swelling, SOB or lightheadedness with hypotension: Yes Has patient had a PCN reaction causing severe rash involving mucus membranes or skin necrosis: No Has patient had a PCN reaction that required hospitalization: No Has patient had a PCN reaction occurring within the last 10 years: No If all of the above answers are "NO", then may proceed with Cephalosporin use.    Ketorolac Other (See Comments)    "Makes him feel like he is crazy"   Betadine [Povidone Iodine] Other (See Comments)    " irritates skin, burns and stings"   Soy Allergy     Upsets bladder     History of Present Illness    Dylan Woods is a 76 y.o. male who presents via audio/video conferencing for a telehealth visit today.  Pt was last seen in cardiology clinic on 08/06/2021 by Dr. Curt Bears.  At that time Dylan Woods was doing well .  The patient is now pending procedure as outlined above. Since his last visit, he remains stable from a cardiac standpoint.  Today he denies chest pain, shortness of  breath, lower extremity edema, fatigue, palpitations, melena, hematuria, hemoptysis, diaphoresis, weakness, presyncope, syncope, orthopnea, and PND.    Home Medications    Prior to Admission medications   Medication Sig Start Date End Date Taking? Authorizing Provider  acetaminophen (TYLENOL) 500 MG tablet Take 1,000 mg by mouth every 6 (six) hours as needed for moderate pain.    [provider]  acetaminophen-codeine (TYLENOL #3) 300-30 MG tablet Take 1 tablet by mouth daily as needed (headaches). 04/02/19   [provider]  alendronate (FOSAMAX) 70 MG tablet Take 1 tablet by mouth every Saturday. 07/15/18   [provider]  ALPRAZolam Duanne Moron) 0.25 MG tablet Take 0.25 mg by mouth at bedtime as needed for anxiety.    [provider]  Ascorbic Acid (VITAMIN C) 1000 MG tablet Take 1,000 mg by mouth daily.    [provider]  aspirin EC 81 MG tablet Take 81 mg by mouth  daily. Swallow whole.    [provider]  brimonidine (ALPHAGAN) 0.2 % ophthalmic solution Place 1 drop into the left eye in the morning and at bedtime.    [provider]  Calcium Carbonate-Vit D-Min (CALCIUM 600+D PLUS MINERALS) 600-400 MG-UNIT TABS Take 1 tablet by mouth 2 (two) times daily.    [provider]  Calcium Glycerophosphate (PRELIEF PO) Take 3-4 tablets by mouth 3 (three) times daily as needed (overactive bladder).    [provider]  cetirizine (ZYRTEC) 10 MG tablet Take 10 mg by mouth daily as needed for allergies.    [provider]  desmopressin (DDAVP) 0.2 MG tablet Take 0.2 mg by mouth at bedtime. 05/28/18   [provider]  diphenhydramine-acetaminophen (TYLENOL PM) 25-500 MG TABS tablet Take 1 tablet by mouth at bedtime as needed (sleep/pain).    [provider]  Ferrous Sulfate (IRON SUPPLEMENT PO) Take 2 capsules by mouth 2 (two) times daily.    [provider]  ketoconazole (NIZORAL) 2 % cream  Apply 1 application topically daily as needed for irritation.  05/06/19   [provider]  Menthol, Topical Analgesic, (BIOFREEZE EX) Apply 1 application. topically daily as needed (pain).    [provider]  METAMUCIL FIBER PO Take 1 Dose by mouth 2 (two) times daily.    [provider]  metoprolol succinate (TOPROL-XL) 50 MG 24 hr tablet Take 1 tablet (50 mg total) by mouth at bedtime. Take with or immediately following a meal. 08/06/21   Camnitz, Ocie Doyne, MD  Multiple Vitamins-Minerals (ADULT GUMMY) CHEW Chew 1 capsule by mouth daily.    [provider]  OVER THE COUNTER MEDICATION Take 1.5 Scoops by mouth 2 (two) times daily. sprout living simple pea protein otc supplement powder    [provider]  OVER THE COUNTER MEDICATION Nature Made Time Release Tablets Melatonin and L-Theanine +GBA    [provider]  Propylene Glycol (SYSTANE COMPLETE OP) Place 1 drop into both eyes 3 (three) times daily.    [provider]  rosuvastatin (CRESTOR) 5 MG tablet Take 5 mg by mouth daily. 03/14/21   [provider]  solifenacin (VESICARE) 5 MG tablet Take 5 mg by mouth daily. 06/25/21   [provider]  valACYclovir (VALTREX) 1000 MG tablet Take 1,000 mg by mouth daily as needed (for cold sores).  11/13/15   [provider]  Vibegron (GEMTESA) 75 MG TABS Take 75 mg by mouth daily.    [provider]    Physical Exam    Vital Signs:  Dylan Woods does not have vital signs available for review today.  Given telephonic nature of communication, physical exam is limited. AAOx3. NAD. Normal affect.  Speech and respirations are unlabored.  Accessory Clinical Findings    None  Assessment & Plan    1.  Preoperative Cardiovascular Risk Assessment: Cystoscopy, Dr.Dahlstedt      Primary Cardiologist: [name], MD  Chart reviewed as part of pre-operative protocol coverage. Given past medical history  and time since last visit, based on ACC/AHA guidelines, Dylan Woods would be at acceptable risk for the planned procedure without further cardiovascular testing.   Patient was advised that if he develops new symptoms prior to surgery to contact our office to arrange a follow-up appointment.  He verbalized understanding.   He may hold Apirin for 7 days prior to procedure. Please resume Aspirin as soon as possible postprocedure, at the discretion of the surgeon.  A copy of this note will be routed to requesting surgeon.  Time:   Today, I have spent 6 minutes with the patient with telehealth technology discussing medical history, symptoms, and management plan.     Deberah Pelton, NP  12/11/2021, 12:59 PM

## 2021-12-12 ENCOUNTER — Encounter: Payer: Self-pay | Admitting: Psychiatry

## 2021-12-12 ENCOUNTER — Other Ambulatory Visit: Payer: Self-pay | Admitting: Psychiatry

## 2021-12-12 DIAGNOSIS — G629 Polyneuropathy, unspecified: Secondary | ICD-10-CM

## 2021-12-12 DIAGNOSIS — N210 Calculus in bladder: Secondary | ICD-10-CM | POA: Diagnosis not present

## 2021-12-13 ENCOUNTER — Ambulatory Visit: Payer: PPO

## 2021-12-13 DIAGNOSIS — R2681 Unsteadiness on feet: Secondary | ICD-10-CM | POA: Diagnosis not present

## 2021-12-13 DIAGNOSIS — R42 Dizziness and giddiness: Secondary | ICD-10-CM

## 2021-12-13 NOTE — Therapy (Signed)
OUTPATIENT PHYSICAL THERAPY NEURO TREATMENT   Patient Name: Dylan Woods MRN: [id number] DOB:23-Nov-1945, 76 y.o., male Today's Date: 12/13/2021   PCP: Shon Baton REFERRING PROVIDER: Genia Harold, MD    PT End of Session - 12/13/21 1446     Visit Number 7    Number of Visits 8    Date for PT Re-Evaluation 01/03/22    Authorization Type [employer]    PT Start Time 1445    PT Stop Time 3267    PT Time Calculation (min) 45 min    Activity Tolerance Patient tolerated treatment well    Behavior During Therapy [facility] for tasks assessed/performed               Past Medical History:  Diagnosis Date   Anemia    with prostate cancer treatment   Asthma    as a child   Bladder outlet obstruction    Bladder stones    Coronary artery disease    Degenerative arthritis    Diverticulosis    Heart murmur    years ago per pt on 12-11-2021   History of chest pain 02/2021   per pt and epic documentation -- cardiac work-up done ---   History of radiation therapy 2018   boost w/ radioactive prostate seed implants, 110Gy; then IMRT  45Gy in 25 fractions , 01-20-2017 to 02-24-2017   History of urinary retention 12/23/2016   post surgery 12-18-2016   Hyperlipidemia    on crestor   Hyperplasia of prostate with lower urinary tract symptoms (LUTS)    Intermittent palpitations    mostly at night--  cardiologist visit w/ dr Marlou Porch 08-07-2017 in epic, ekg showed PACs   Internal hemorrhoids    Migraines    neurologist-  dr [name]   Neuropathy    both feet   Osteopenia    PAC (premature atrial contraction)    Prostate cancer [facility]) urologist-  dr dahlstedt/  oncologist-  dr [name]   dx 03-29-2016-- Stage T1c, Gleason 4+5, PSA 5.06, vol 74.51cc--- Hormone therapy (lupron injection) prostate decreased to 47cc; 12-18-2016  radiactive seed implants;  completed external beam radiation 02-24-2017 PTNS injections.    Past Surgical History:  Procedure Laterality Date   BLADDER  STONE REMOVAL  2021   CARDIOVASCULAR STRESS TEST  06-19-2016   dr Marlou Porch   Intermediate risk nuclear study w/ no reversible ischemia with moderate size and intensity fixed inferoseptal and septal perfusion defect, consider artifact scar/  LVEF nuclear stress 48% (45-54%)  with septal hypokinesis to dyskinesis   CATARACT EXTRACTION W/ INTRAOCULAR LENS  IMPLANT, BILATERAL  2011;  2004   CIRCUMCISION  age 51   colonscopy  11/16/2021   CYSTOSCOPY N/A 12/18/2016   Procedure: CYSTOSCOPY FLEXIBLE;  Surgeon: [name], MD;  Location: [facility];  Service: Urology;  Laterality: N/A;  NO SEEDS FOUND IN BLADDER   CYSTOSCOPY N/A 02/11/2020   Procedure: CYSTOSCOPY bladder stone extraction placement of foley catheter;  Surgeon: Ceasar Mons, MD;  Location[facility];  Service: Urology;  Laterality: N/A;   CYSTOSCOPY WITH INSERTION OF UROLIFT  03/ 2019   dr [name]  w/ anesthesia @ [facility]   GOLD SEED IMPLANT N/A 12/18/2016   Procedure: GOLD SEED IMPLANT;  Surgeon: [name], MD;  Location: [facility];  Service: Urology;  Laterality: N/A;   3 MARKERS  IMPLANTED   INGUINAL HERNIA REPAIR Right 1992   KNEE ARTHROSCOPY Left 2003;  2008  LEFT HEART CATH AND CORONARY ANGIOGRAPHY N/A 07/20/2021   Procedure: LEFT HEART CATH AND CORONARY ANGIOGRAPHY;  Surgeon: Martinique, Peter M, MD;  Location: [place] [facility];  Service: Cardiovascular;  Laterality: N/A;   NASAL SINUS SURGERY  2015   PROSTATE BIOPSY  03-29-2016   dr Pilar Jarvis (office)   RADIOACTIVE SEED IMPLANT N/A 12/18/2016   Procedure: RADIOACTIVE SEED IMPLANT/BRACHYTHERAPY IMPLANT;  Surgeon: [name], MD;  Location: [facility];  Service: Urology;  Laterality: N/A;    62  SEEDS IMPLANTED   rt foot metatarsal Right    SPACE OAR INSTILLATION N/A 12/18/2016   Procedure: SPACE OAR INSTILLATION;  Surgeon: [name], MD;  Location: [facility];   Service: Urology;  Laterality: N/A;   TRANSURETHRAL RESECTION OF PROSTATE N/A 10/31/2017   Procedure: TRANSURETHRAL RESECTION OF THE PROSTATE (TURP);  Surgeon: [name], MD;  Location: [facility];  Service: Urology;  Laterality: N/A;   VITRECTOMY Left 09/2021   with lens implants   Patient Active Problem List   Diagnosis Date Noted   Precordial pain 07/05/2021   Urinary retention 02/11/2020   Memory loss 07/19/2019   Chronic headaches 07/23/2018   Hyperplasia of prostate with urinary obstruction 10/31/2017   Increased urinary frequency 09/24/2017   OAB (overactive bladder) 09/24/2017   Urinary urgency 09/24/2017   PAC (premature atrial contraction) 06/17/2016   Palpitations 05/22/2016   Malignant neoplasm of prostate ([place]) 05/22/2016   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  No dizziness or visual disturbances with head movements. Received results of nerve tests for legs and demonstrated some neuropathy in the feet Pt accompanied by: self  PERTINENT HISTORY: He is concerned because he has noticed worsening balance. Denies leg weakness or dizziness, just feels he is unstable. This is particularly bad when he closes his eyes in the shower. Has not had any falls   PAIN:  Are you having pain? No  PRECAUTIONS: None  WEIGHT BEARING RESTRICTIONS No  FALLS: Has patient fallen in last 6  months?  No  LIVING ENVIRONMENT: Lives with: lives alone Lives in: House/apartment Stairs: Yes: Internal: 12 steps; can reach both and External: 12 steps; on right going up Has following equipment at home: None  PLOF: Independent  PATIENT GOALS be able to climb steps with less reliance on HR, be able to walk on trails if possilbe     OBJECTIVE:   TODAY'S TREATMENT: 12/13/21 Activity Comments  Dynamic Gait Index 24/24  Farmer's walk/march 10#  Retro-walk Looking over shoulder  Tandem walk   Braided sidestepping CGA  HEP review       Access Code: [email or web address] URL: [email or web address] Date: 11/27/2021-last update Prepared by: [facility]  Exercises - Tandem Stance with Eyes Closed in Corner  - 1 x daily - 7 x weekly - 3 sets - 30 sec hold - Corner Balance Feet Together With Eyes Closed  - 1 x daily - 7 x weekly - 3 sets - 30 sec hold - Forward Step Down with Heel Tap and Counter Support  - 1 x daily - 5 x weekly - 2 sets - 10 reps - Forward Step Up  - 1 x daily - 5 x weekly - 2 sets - 10 reps - Side Stepping with Resistance at Ankles and Counter Support  - 1 x daily - 5 x weekly - 1 sets - 3-5 reps  PATIENT EDUCATION: Education details: Updates/additions to [employer] Person educated: Patient Education method: Consulting civil engineer, Media planner, and Handouts Education comprehension: verbalized understanding and returned demonstration   ----------------------------------------------------------------------------------- FROM EVAL:   DIAGNOSTIC FINDINGS: MRI images were reviewed (no report), brain MRI was normal and he did appear to have left foraminal narrowing on MRI C-spine. Has not had any more episodes of paresthesias and denies neck pain at this time   COGNITION: Overall cognitive status: Within functional limits for tasks assessed   SENSATION: DNT, pt reports nerve conduction test is to take place  COORDINATION: WNL  EDEMA:   none  MUSCLE TONE: WNL   POSTURE: decreased lumbar lordosis and flexed trunk   LOWER EXTREMITY ROM:     WNL   LOWER EXTREMITY MMT:    MMT Right Eval Left Eval  Hip flexion 5 5  Hip extension    Hip abduction 5 5  Hip adduction    Hip internal rotation    Hip external rotation    Knee flexion 5 5  Knee extension 5 5  Ankle dorsiflexion 5 5  Ankle plantarflexion    Ankle inversion    Ankle eversion    --Gross LE strength tested with pt in sitting  BED MOBILITY:  indep  TRANSFERS: independent   STAIRS:  Level of Assistance: Modified independence  Stair Negotiation Technique: Alternating Pattern  with Single Rail on Right  Number of Stairs: 10   Height of Stairs: 4-6"  Comments:   GAIT: Gait pattern: WFL Distance walked:  Assistive device utilized: None Level of assistance: Complete Independence Comments:   FUNCTIONAL TESTs:  Berg Balance Scale: 53/56 Dynamic Gait Index: 21/24    Ocular assessment:  Saccades: WNL Smooth pursuits: WNL Spontaneous nystagmus: absent Head Impulse Test: +Right; -Left VOR: to be tested VOR cancellation: to be tested   M-CTSIB  Condition 1: Firm Surface, EO 30 Sec, Normal Sway  Condition 2: Firm Surface, EC 30 Sec, Normal and Mild Sway  Condition 3: Foam Surface, EO 30 Sec, Normal Sway  Condition 4: Foam Surface, EC 9 Sec, Severe Sway  TODAY'S TREATMENT:  Evaluation, standing balance for HEP   PATIENT EDUCATION: Education details: assessment findings Person educated: Patient Education method: Explanation and Handouts Education comprehension: verbalized understanding   HOME EXERCISE PROGRAM: Access Code: [email or web address] URL: [email or web address] Date: 10/25/2021 Prepared by: Sherlyn Lees  Exercises - Tandem Stance with Eyes Closed in Corner  - 1 x daily - 7 x weekly - 3 sets - 30 sec hold - Corner Balance Feet Together With Eyes Closed  - 1 x daily - 7 x weekly - 3 sets - 30 sec hold -  Forward Step Down  - 7 x weekly - 5 sets - 5 reps - Gaze Stability (VOR) x1 Forward Ambulation With Distant Target With Horizontal Head Turns  - 1 x daily - 7 x weekly -------------------------------------------------------------------------------------------------  GOALS: Goals reviewed with patient? Yes  SHORT TERM GOALS: Target date: 11/22/2021  The patient will be independent with HEP for gaze adaptation, habituation, balance, and general mobility. Baseline: Goal status: MET    LONG TERM GOALS: Target date: 01/03/2022  Demonstrate improved balance/vestibular function as evidenced by mild sway condition 4 M-CTSIB x 30 sec Baseline: severe with LOB x 9 sec Goal status: PROGRESSING  2.  Demonstrate improved balance and safety with gait per score 24/24 Dynamic Gait Index Baseline: 21/24, main deficits with head turns whilst walking; 24/24 Goal status: MET  3.  Demonstrate/report improved balance per ability to ambulate 12 stairs without use of HR Baseline: single HR required Goal status: PROGRESSING    ASSESSMENT:  CLINICAL IMPRESSION: Demo good recall to HEP.  Pt has not been experiencing symptoms that would lead one to suspect vestibular dysfunction.  Progressing w/ dynamic standing balance and coordination activities progressing unilateral farmer's carry to heavier resistance and emphasis on single leg support. Demo improved performance of DGI with 24/24 vs 21/24 at outset and notes improved stair negotiation without use of HR  OBJECTIVE IMPAIRMENTS decreased balance, decreased knowledge of condition, difficulty walking, dizziness, and postural dysfunction.   ACTIVITY LIMITATIONS carrying, stairs, and locomotion level  PARTICIPATION LIMITATIONS: community activity and yard work  PERSONAL FACTORS Age, Time since onset of injury/illness/exacerbation, and 1 comorbidity: hx of prostate CA, cardiac  are also affecting patient's functional outcome.   REHAB POTENTIAL:  Excellent  CLINICAL DECISION MAKING: Evolving/moderate complexity  EVALUATION COMPLEXITY: Moderate  PLAN: PT FREQUENCY: 1x/week  PT DURATION: 10 weeks  PLANNED INTERVENTIONS: Therapeutic exercises, Therapeutic activity, Neuromuscular re-education, Balance training, Gait training, Patient/Family education, Joint mobilization, Stair training, Vestibular training, Canalith repositioning, DME instructions, Aquatic Therapy, Dry Needling, Cryotherapy, Moist heat, Biofeedback, and Manual therapy  PLAN FOR NEXT SESSION: D/C to HEP  2:46 PM, 12/13/21 M. Sherlyn Lees, PT, DPT Physical Therapist- [place] Office Number: [phone]    [place] [facility] [address] [place], [place] [address] Phone # [phone] Fax # [phone]

## 2021-12-17 ENCOUNTER — Encounter (HOSPITAL_BASED_OUTPATIENT_CLINIC_OR_DEPARTMENT_OTHER)
Admission: RE | Disposition: A | Discharge status: Home / Self Care | Payer: Self-pay | Source: Home / Self Care | Attending: Urology

## 2021-12-17 ENCOUNTER — Ambulatory Visit (HOSPITAL_BASED_OUTPATIENT_CLINIC_OR_DEPARTMENT_OTHER): Payer: PPO | Admitting: Anesthesiology

## 2021-12-17 ENCOUNTER — Other Ambulatory Visit: Payer: Self-pay

## 2021-12-17 ENCOUNTER — Ambulatory Visit (HOSPITAL_BASED_OUTPATIENT_CLINIC_OR_DEPARTMENT_OTHER)
Admission: RE | Admit: 2021-12-17 | Discharge: 2021-12-17 | Disposition: A | Discharge status: Home / Self Care | Payer: PPO | Attending: Urology | Admitting: Urology

## 2021-12-17 ENCOUNTER — Encounter (HOSPITAL_BASED_OUTPATIENT_CLINIC_OR_DEPARTMENT_OTHER): Payer: Self-pay | Admitting: Urology

## 2021-12-17 DIAGNOSIS — N210 Calculus in bladder: Secondary | ICD-10-CM

## 2021-12-17 DIAGNOSIS — I10 Essential (primary) hypertension: Secondary | ICD-10-CM | POA: Diagnosis not present

## 2021-12-17 DIAGNOSIS — Z923 Personal history of irradiation: Secondary | ICD-10-CM | POA: Diagnosis not present

## 2021-12-17 DIAGNOSIS — N3289 Other specified disorders of bladder: Secondary | ICD-10-CM | POA: Insufficient documentation

## 2021-12-17 DIAGNOSIS — Z79899 Other long term (current) drug therapy: Secondary | ICD-10-CM | POA: Insufficient documentation

## 2021-12-17 DIAGNOSIS — Z01818 Encounter for other preprocedural examination: Secondary | ICD-10-CM

## 2021-12-17 DIAGNOSIS — I2510 Atherosclerotic heart disease of native coronary artery without angina pectoris: Secondary | ICD-10-CM | POA: Insufficient documentation

## 2021-12-17 DIAGNOSIS — Z8546 Personal history of malignant neoplasm of prostate: Secondary | ICD-10-CM | POA: Diagnosis not present

## 2021-12-17 DIAGNOSIS — N320 Bladder-neck obstruction: Secondary | ICD-10-CM | POA: Diagnosis not present

## 2021-12-17 DIAGNOSIS — Z87891 Personal history of nicotine dependence: Secondary | ICD-10-CM | POA: Diagnosis not present

## 2021-12-17 HISTORY — PX: CYSTOSCOPY WITH LITHOLAPAXY: SHX1425

## 2021-12-17 HISTORY — DX: Calculus in bladder: N21.0

## 2021-12-17 HISTORY — DX: Other specified disorders of bone density and structure, unspecified site: M85.80

## 2021-12-17 HISTORY — DX: Atherosclerotic heart disease of native coronary artery without angina pectoris: I25.10

## 2021-12-17 HISTORY — DX: Atrial premature depolarization: I49.1

## 2021-12-17 HISTORY — PX: CYSTOSCOPY WITH URETHRAL DILATATION: SHX5125

## 2021-12-17 HISTORY — DX: Polyneuropathy, unspecified: G62.9

## 2021-12-17 LAB — POCT I-STAT, CHEM 8
BUN: 17 mg/dL (ref 8–23)
Calcium, Ion: 1.32 mmol/L (ref 1.15–1.40)
Chloride: 103 mmol/L (ref 98–111)
Creatinine, Ser: 0.7 mg/dL (ref 0.61–1.24)
Glucose, Bld: 88 mg/dL (ref 70–99)
HCT: 45 % (ref 39.0–52.0)
Hemoglobin: 15.3 g/dL (ref 13.0–17.0)
Potassium: 4.4 mmol/L (ref 3.5–5.1)
Sodium: 140 mmol/L (ref 135–145)
TCO2: 25 mmol/L (ref 22–32)

## 2021-12-17 SURGERY — CYSTOSCOPY, WITH BLADDER CALCULUS LITHOLAPAXY
Anesthesia: General

## 2021-12-17 MED ORDER — PROPOFOL 10 MG/ML IV BOLUS
INTRAVENOUS | Status: DC | PRN
  Administered 2021-12-17: 40 mg via INTRAVENOUS
  Administered 2021-12-17: 160 mg via INTRAVENOUS
  Administered 2021-12-17: 30 mg via INTRAVENOUS

## 2021-12-17 MED ORDER — ONDANSETRON HCL 4 MG/2ML IJ SOLN
4.0000 mg | Freq: Once | INTRAMUSCULAR | Status: DC | PRN
Start: 2021-12-17 — End: 2021-12-17

## 2021-12-17 MED ORDER — SULFAMETHOXAZOLE-TRIMETHOPRIM 800-160 MG PO TABS: 1.0000 | ORAL_TABLET | Freq: Two times a day (BID) | ORAL | 0 refills | Status: DC

## 2021-12-17 MED ORDER — IOHEXOL 140 MG/ML IV SOLN
INTRAVENOUS | Status: DC | PRN
  Administered 2021-12-17: 20 mL via INTRAVENOUS

## 2021-12-17 MED ORDER — OXYBUTYNIN CHLORIDE 5 MG PO TABS
ORAL_TABLET | ORAL | Status: AC
  Filled 2021-12-17: qty 1

## 2021-12-17 MED ORDER — FENTANYL CITRATE (PF) 100 MCG/2ML IJ SOLN
INTRAMUSCULAR | Status: AC
  Filled 2021-12-17: qty 2

## 2021-12-17 MED ORDER — DEXAMETHASONE SODIUM PHOSPHATE 10 MG/ML IJ SOLN
INTRAMUSCULAR | Status: DC | PRN
  Administered 2021-12-17: 10 mg via INTRAVENOUS

## 2021-12-17 MED ORDER — OXYBUTYNIN CHLORIDE 5 MG PO TABS
5.0000 mg | ORAL_TABLET | Freq: Once | ORAL | Status: AC
  Administered 2021-12-17: 5 mg via ORAL

## 2021-12-17 MED ORDER — FENTANYL CITRATE (PF) 100 MCG/2ML IJ SOLN
INTRAMUSCULAR | Status: DC | PRN
  Administered 2021-12-17: 25 ug via INTRAVENOUS
  Administered 2021-12-17: 50 ug via INTRAVENOUS
  Administered 2021-12-17: 25 ug via INTRAVENOUS

## 2021-12-17 MED ORDER — EPHEDRINE SULFATE-NACL 50-0.9 MG/10ML-% IV SOSY
PREFILLED_SYRINGE | INTRAVENOUS | Status: DC | PRN
  Administered 2021-12-17: 10 mg via INTRAVENOUS

## 2021-12-17 MED ORDER — STERILE WATER FOR IRRIGATION IR SOLN
Status: DC | PRN
  Administered 2021-12-17: 9000 mL via INTRAVESICAL

## 2021-12-17 MED ORDER — FENTANYL CITRATE (PF) 100 MCG/2ML IJ SOLN: 25.0000 ug | INTRAMUSCULAR | Status: DC | PRN

## 2021-12-17 MED ORDER — CEFAZOLIN SODIUM-DEXTROSE 2-4 GM/100ML-% IV SOLN
INTRAVENOUS | Status: AC
  Filled 2021-12-17: qty 100

## 2021-12-17 MED ORDER — EPHEDRINE 5 MG/ML INJ
INTRAVENOUS | Status: AC
  Filled 2021-12-17: qty 5

## 2021-12-17 MED ORDER — LACTATED RINGERS IV SOLN: INTRAVENOUS | Status: DC

## 2021-12-17 MED ORDER — ONDANSETRON HCL 4 MG/2ML IJ SOLN
INTRAMUSCULAR | Status: DC | PRN
  Administered 2021-12-17: 4 mg via INTRAVENOUS

## 2021-12-17 MED ORDER — TRAMADOL HCL 50 MG PO TABS
ORAL_TABLET | ORAL | Status: AC
  Filled 2021-12-17: qty 1

## 2021-12-17 MED ORDER — ACETAMINOPHEN 500 MG PO TABS
ORAL_TABLET | ORAL | Status: AC
  Filled 2021-12-17: qty 2

## 2021-12-17 MED ORDER — DEXAMETHASONE SODIUM PHOSPHATE 10 MG/ML IJ SOLN
INTRAMUSCULAR | Status: AC
  Filled 2021-12-17: qty 1

## 2021-12-17 MED ORDER — TRAMADOL HCL 50 MG PO TABS
50.0000 mg | ORAL_TABLET | Freq: Once | ORAL | Status: AC
  Administered 2021-12-17: 50 mg via ORAL

## 2021-12-17 MED ORDER — ONDANSETRON HCL 4 MG/2ML IJ SOLN
INTRAMUSCULAR | Status: AC
  Filled 2021-12-17: qty 2

## 2021-12-17 MED ORDER — GLYCOPYRROLATE PF 0.2 MG/ML IJ SOSY
PREFILLED_SYRINGE | INTRAMUSCULAR | Status: AC
  Filled 2021-12-17: qty 1

## 2021-12-17 MED ORDER — PROPOFOL 10 MG/ML IV BOLUS
INTRAVENOUS | Status: AC
  Filled 2021-12-17: qty 20

## 2021-12-17 MED ORDER — LIDOCAINE 2% (20 MG/ML) 5 ML SYRINGE
INTRAMUSCULAR | Status: DC | PRN
  Administered 2021-12-17: 100 mg via INTRAVENOUS

## 2021-12-17 MED ORDER — GLYCOPYRROLATE PF 0.2 MG/ML IJ SOSY
PREFILLED_SYRINGE | INTRAMUSCULAR | Status: DC | PRN
  Administered 2021-12-17: .2 mg via INTRAVENOUS

## 2021-12-17 MED ORDER — ACETAMINOPHEN 500 MG PO TABS
1000.0000 mg | ORAL_TABLET | Freq: Once | ORAL | Status: AC
  Administered 2021-12-17: 1000 mg via ORAL

## 2021-12-17 MED ORDER — CEFAZOLIN SODIUM-DEXTROSE 2-4 GM/100ML-% IV SOLN
2.0000 g | INTRAVENOUS | Status: AC
  Administered 2021-12-17: 2 g via INTRAVENOUS

## 2021-12-17 SURGICAL SUPPLY — 32 items
BAG DRAIN URO-CYSTO SKYTR STRL (DRAIN) ×1 IMPLANT
BAG DRN UROCATH (DRAIN) ×1
BAG URINE LEG 500ML (DRAIN) IMPLANT
BALLN NEPHROSTOMY (BALLOONS)
BALLN OPTILUME DCB 30X3X75 (BALLOONS)
BALLN OPTILUME DCB 30X5X75 (BALLOONS) ×1
BALLOON NEPHROSTOMY (BALLOONS) IMPLANT
BALLOON OPTILUME DCB 30X3X75 (BALLOONS) IMPLANT
BALLOON OPTILUME DCB 30X5X75 (BALLOONS) IMPLANT
CATH FOLEY 2WAY SLVR  5CC 18FR (CATHETERS) ×1
CATH FOLEY 2WAY SLVR  5CC 20FR (CATHETERS)
CATH FOLEY 2WAY SLVR  5CC 22FR (CATHETERS)
CATH FOLEY 2WAY SLVR 5CC 18FR (CATHETERS) IMPLANT
CATH FOLEY 2WAY SLVR 5CC 20FR (CATHETERS) IMPLANT
CATH FOLEY 2WAY SLVR 5CC 22FR (CATHETERS) IMPLANT
CATH URETL OPEN END 6FR 70 (CATHETERS) IMPLANT
CLOTH BEACON ORANGE TIMEOUT ST (SAFETY) ×2 IMPLANT
FIBER LASER FLEXIVA 1000 (UROLOGICAL SUPPLIES) IMPLANT
GLOVE BIO SURGEON STRL SZ8 (GLOVE) ×1 IMPLANT
GLOVE BIOGEL PI IND STRL 7.0 (GLOVE) IMPLANT
GLOVE BIOGEL PI INDICATOR 7.0 (GLOVE) ×2
GOWN STRL REUS W/TWL XL LVL3 (GOWN DISPOSABLE) ×2 IMPLANT
GUIDEWIRE ANG ZIPWIRE 038X150 (WIRE) IMPLANT
GUIDEWIRE STR DUAL SENSOR (WIRE) IMPLANT
HOLDER FOLEY CATH W/STRAP (MISCELLANEOUS) IMPLANT
KIT TURNOVER CYSTO (KITS) ×1 IMPLANT
MANIFOLD NEPTUNE II (INSTRUMENTS) ×1 IMPLANT
NS IRRIG 500ML POUR BTL (IV SOLUTION) IMPLANT
PACK CYSTO (CUSTOM PROCEDURE TRAY) ×1 IMPLANT
TUBE CONNECTING 12X1/4 (SUCTIONS) ×1 IMPLANT
WATER STERILE IRR 3000ML UROMA (IV SOLUTION) ×1 IMPLANT
WATER STERILE IRR 500ML POUR (IV SOLUTION) IMPLANT

## 2021-12-17 NOTE — Anesthesia Procedure Notes (Signed)
Procedure Name: LMA Insertion Date/Time: 12/17/2021 9:19 AM  Performed by: Rogers Blocker, CRNAPre-anesthesia Checklist: Patient identified, Emergency Drugs available, Suction available and Patient being monitored Patient Re-evaluated:Patient Re-evaluated prior to induction Oxygen Delivery Method: Circle System Utilized Preoxygenation: Pre-oxygenation with 100% oxygen Induction Type: IV induction Ventilation: Mask ventilation without difficulty LMA: LMA inserted LMA Size: 5.0 Number of attempts: 1 Placement Confirmation: positive ETCO2 Tube secured with: Tape Dental Injury: Teeth and Oropharynx as per pre-operative assessment

## 2021-12-17 NOTE — Anesthesia Preprocedure Evaluation (Addendum)
Anesthesia Evaluation  Patient identified by MRN, date of birth, ID band Patient awake    Reviewed: Allergy & Precautions, NPO status , Patient's Chart, lab work & pertinent test results, reviewed documented beta blocker date and time   Airway Mallampati: I  TM Distance: >3 FB Neck ROM: Full    Dental  (+) Teeth Intact, Dental Advisory Given   Pulmonary asthma , former smoker,    Pulmonary exam normal breath sounds clear to auscultation       Cardiovascular hypertension, Pt. on home beta blockers + CAD  Normal cardiovascular exam Rhythm:Regular Rate:Normal     Neuro/Psych  Headaches,    GI/Hepatic negative GI ROS, Neg liver ROS,   Endo/Other  negative endocrine ROS  Renal/GU negative Renal ROS   -BLADDER STONES, URETHRAL STRICTURE -H/o prostate cancer    Musculoskeletal  (+) Arthritis ,   Abdominal   Peds  Hematology negative hematology ROS (+)   Anesthesia Other Findings Day of surgery medications reviewed with the patient.  Reproductive/Obstetrics                            Anesthesia Physical Anesthesia Plan  ASA: 2  Anesthesia Plan: General   Post-op Pain Management: Tylenol PO (pre-op)*   Induction: Intravenous  PONV Risk Score and Plan: 3 and Dexamethasone and Ondansetron  Airway Management Planned: LMA  Additional Equipment:   Intra-op Plan:   Post-operative Plan: Extubation in OR  Informed Consent: I have reviewed the patients History and Physical, chart, labs and discussed the procedure including the risks, benefits and alternatives for the proposed anesthesia with the patient or authorized representative who has indicated his/her understanding and acceptance.     Dental advisory given  Plan Discussed with: CRNA  Anesthesia Plan Comments:         Anesthesia Quick Evaluation

## 2021-12-17 NOTE — OR Nursing (Signed)
Stone sent with Dr. [name]

## 2021-12-17 NOTE — Transfer of Care (Signed)
Immediate Anesthesia Transfer of Care Note  Patient: Dylan Woods  Procedure(s) Performed: CYSTOSCOPY WITH LITHOLAPAXY CYSTOSCOPY WITH  OPTILUME TREATMENT OF URETHRAL DILATATION  Patient Location: PACU  Anesthesia Type:General  Level of Consciousness: drowsy, patient cooperative and responds to stimulation  Airway & Oxygen Therapy: Patient Spontanous Breathing  Post-op Assessment: Report given to RN and Post -op Vital signs reviewed and stable  Post vital signs: Reviewed and stable  Last Vitals:  Vitals Value Taken Time  BP 114/69 12/17/21 1015  Temp    Pulse 57 12/17/21 1016  Resp 12 12/17/21 1016  SpO2 96 % 12/17/21 1016  Vitals shown include unvalidated device data.  Last Pain:  Vitals:   12/17/21 0811  TempSrc: Oral  PainSc: 0-No pain      Patients Stated Pain Goal: 4 (84/[id number])  Complications: No notable events documented.

## 2021-12-17 NOTE — Discharge Instructions (Addendum)
You may see some blood in the urine and may have some burning with urination for 48-72 hours. You also may notice that you have to urinate more frequently or urgently after your procedure which is normal.  You should call should you develop an inability urinate, fever > 101, persistent nausea and vomiting that prevents you from eating or drinking to stay hydrated.  If you have a catheter, you will be taught how to take care of the catheter by the nursing staff prior to discharge from the hospital.  You may periodically feel a strong urge to void with the catheter in place.  This is a bladder spasm and most often can occur when having a bowel movement or moving around. It is typically self-limited and usually will stop after a few minutes.  You may use some Vaseline or Neosporin around the tip of the catheter to reduce friction at the tip of the penis. You may also see some blood in the urine.  A very small amount of blood can make the urine look quite red.  As long as the catheter is draining well, there usually is not a problem.  However, if the catheter is not draining well and is bloody, you should call the office [phone]) to notify us.       No acetaminophen/Tylenol until after 1:45 pm today if needed.    CYSTOSCOPY HOME CARE INSTRUCTIONS  Activity: Rest for the remainder of the day.  Do not drive or operate equipment today.  You may resume normal activities in one to two days as instructed by your physician.   Meals: Drink plenty of liquids and eat light foods such as gelatin or soup this evening.  You may return to a normal meal plan tomorrow.  Return to Work: You may return to work in one to two days or as instructed by your physician.  Special Instructions / Symptoms: Call your physician if any of these symptoms occur:   -persistent or heavy bleeding  -bleeding which continues after first few urination  -large blood clots that are difficult to pass  -urine stream diminishes  or stops completely  -fever equal to or higher than 101 degrees Farenheit.  -cloudy urine with a strong, foul odor  -severe pain  Females should always wipe from front to back after elimination.  You may feel some burning pain when you urinate.  This should disappear with time.  Applying moist heat to the lower abdomen or a hot tub bath may help relieve the pain.    Post Anesthesia Home Care Instructions  Activity: Get plenty of rest for the remainder of the day. A responsible individual must stay with you for 24 hours following the procedure.  For the next 24 hours, DO NOT: -Drive a car -Paediatric nurse -Drink alcoholic beverages -Take any medication unless instructed by your physician -Make any legal decisions or sign important papers.  Meals: Start with liquid foods such as gelatin or soup. Progress to regular foods as tolerated. Avoid greasy, spicy, heavy foods. If nausea and/or vomiting occur, drink only clear liquids until the nausea and/or vomiting subsides. Call your physician if vomiting continues.  Special Instructions/Symptoms: Your throat may feel dry or sore from the anesthesia or the breathing tube placed in your throat during surgery. If this causes discomfort, gargle with warm salt water. The discomfort should disappear within 24 hours.

## 2021-12-17 NOTE — Anesthesia Postprocedure Evaluation (Signed)
Anesthesia Post Note  Patient: Dylan Woods  Procedure(s) Performed: CYSTOSCOPY WITH LITHOLAPAXY CYSTOSCOPY WITH  OPTILUME TREATMENT OF URETHRAL DILATATION     Patient location during evaluation: PACU Anesthesia Type: General Level of consciousness: awake and alert Pain management: pain level controlled Vital Signs Assessment: post-procedure vital signs reviewed and stable Respiratory status: spontaneous breathing, nonlabored ventilation, respiratory function stable and patient connected to nasal cannula oxygen Cardiovascular status: blood pressure returned to baseline and stable Postop Assessment: no apparent nausea or vomiting Anesthetic complications: no   No notable events documented.  Last Vitals:  Vitals:   12/17/21 0811 12/17/21 1015  BP: 124/71 114/69  Pulse: 71 (!) 51  Resp: 16 12  Temp: (!) 36.4 C 36.4 C  SpO2: 96% 97%    Last Pain:  Vitals:   12/17/21 1015  TempSrc:   PainSc: 0-No pain                 Santa Lighter

## 2021-12-17 NOTE — H&P (Signed)
H&P  Chief Complaint: Bladder stone, BNC  History of Present Illness: 76 yo male w/ h/o high risk prostate cancer, status post radiotherapy as well as subsequent BPH/retention.  He has undergone previous treatment for a bladder stone.  Recent follow-up revealed the patient have a recurrent stone at his bladder neck as well as a bladder neck contracture.  He presents at this time for cystolitholapaxy and Optilume management of his bladder neck contracture. Past Medical History:  Diagnosis Date   Anemia    with prostate cancer treatment   Asthma    as a child   Bladder outlet obstruction    Bladder stones    Coronary artery disease    Degenerative arthritis    Diverticulosis    Heart murmur    years ago per pt on 12-11-2021   History of chest pain 02/2021   per pt and epic documentation -- cardiac work-up done ---   History of radiation therapy 2018   boost w/ radioactive prostate seed implants, 110Gy; then IMRT  45Gy in 25 fractions , 01-20-2017 to 02-24-2017   History of urinary retention 12/23/2016   post surgery 12-18-2016   Hyperlipidemia    on crestor   Hyperplasia of prostate with lower urinary tract symptoms (LUTS)    Intermittent palpitations    mostly at night--  cardiologist visit w/ dr Marlou Porch 08-07-2017 in epic, ekg showed PACs   Internal hemorrhoids    Migraines    neurologist-  dr [name]   Neuropathy    both feet   Osteopenia    PAC (premature atrial contraction)    Prostate cancer [facility]) urologist-  dr Daleyza Gadomski/  oncologist-  dr [name]   dx 03-29-2016-- Stage T1c, Gleason 4+5, PSA 5.06, vol 74.51cc--- Hormone therapy (lupron injection) prostate decreased to 47cc; 12-18-2016  radiactive seed implants;  completed external beam radiation 02-24-2017 PTNS injections.     Past Surgical History:  Procedure Laterality Date   BLADDER STONE REMOVAL  2021   CARDIOVASCULAR STRESS TEST  06-19-2016   dr Marlou Porch   Intermediate risk nuclear study w/ no reversible ischemia with  moderate size and intensity fixed inferoseptal and septal perfusion defect, consider artifact scar/  LVEF nuclear stress 48% (45-54%)  with septal hypokinesis to dyskinesis   CATARACT EXTRACTION W/ INTRAOCULAR LENS  IMPLANT, BILATERAL  2011;  2004   CIRCUMCISION  age 60   colonscopy  11/16/2021   CYSTOSCOPY N/A 12/18/2016   Procedure: CYSTOSCOPY FLEXIBLE;  Surgeon: [name], MD;  Location: [facility];  Service: Urology;  Laterality: N/A;  NO SEEDS FOUND IN BLADDER   CYSTOSCOPY N/A 02/11/2020   Procedure: CYSTOSCOPY bladder stone extraction placement of foley catheter;  Surgeon: Ceasar Mons, MD;  Location: [facility];  Service: Urology;  Laterality: N/A;   CYSTOSCOPY WITH INSERTION OF UROLIFT  03/ 2019   dr [name]  w/ anesthesia @ [facility]   GOLD SEED IMPLANT N/A 12/18/2016   Procedure: GOLD SEED IMPLANT;  Surgeon: [name], MD;  Location: [facility];  Service: Urology;  Laterality: N/A;   3 MARKERS  IMPLANTED   INGUINAL HERNIA REPAIR Right 1992   KNEE ARTHROSCOPY Left 2003;  2008   LEFT HEART CATH AND CORONARY ANGIOGRAPHY N/A 07/20/2021   Procedure: LEFT HEART CATH AND CORONARY ANGIOGRAPHY;  Surgeon: Martinique, Peter M, MD;  Location: [facility];  Service: Cardiovascular;  Laterality: N/A;   NASAL SINUS SURGERY  2015   PROSTATE BIOPSY  03-29-2016  dr Pilar Jarvis (office)   RADIOACTIVE SEED IMPLANT N/A 12/18/2016   Procedure: RADIOACTIVE SEED IMPLANT/BRACHYTHERAPY IMPLANT;  Surgeon: [name], MD;  Location: [facility];  Service: Urology;  Laterality: N/A;    62  SEEDS IMPLANTED   rt foot metatarsal Right    SPACE OAR INSTILLATION N/A 12/18/2016   Procedure: SPACE OAR INSTILLATION;  Surgeon: [name], MD;  Location: [facility];  Service: Urology;  Laterality: N/A;   TRANSURETHRAL RESECTION OF PROSTATE N/A 10/31/2017   Procedure: TRANSURETHRAL RESECTION OF THE PROSTATE  (TURP);  Surgeon: [name], MD;  Location: [facility];  Service: Urology;  Laterality: N/A;   VITRECTOMY Left 09/2021   with lens implants    Home Medications:    Allergies:  Allergies  Allergen Reactions   Ampicillin Rash    Has patient had a PCN reaction causing immediate rash, facial/tongue/throat swelling, SOB or lightheadedness with hypotension: Yes Has patient had a PCN reaction causing severe rash involving mucus membranes or skin necrosis: No Has patient had a PCN reaction that required hospitalization: No Has patient had a PCN reaction occurring within the last 10 years: No If all of the above answers are "NO", then may proceed with Cephalosporin use.    Ketorolac Other (See Comments)    "Makes him feel like he is crazy"   Betadine [Povidone Iodine] Other (See Comments)    " irritates skin, burns and stings"   Soy Allergy     Upsets bladder     Family History  Problem Relation Age of Onset   Cancer Mother        lymphoma   Colon polyps Mother    Bladder Cancer Mother    Parkinsonism Father    Colon polyps Father    Cancer Brother        prostate ca s/p prostatectomy   Colon cancer Neg Hx    Esophageal cancer Neg Hx    Stomach cancer Neg Hx    Rectal cancer Neg Hx     Social History:  reports that he quit smoking about 52 years ago. His smoking use included cigarettes. He has a 1.50 pack-year smoking history. He has never used smokeless tobacco. He reports that he does not currently use alcohol. He reports that he does not use drugs.  ROS: A complete review of systems was performed.  All systems are negative except for pertinent findings as noted.  Physical Exam:  Vital signs in last 24 hours: BP 124/71   Pulse 71   Temp (!) 97.5 F (36.4 C) (Oral)   Resp 16   Ht 6' (1.829 m)   Wt 84.4 kg   SpO2 96%   BMI 25.23 kg/m  Constitutional:  Alert and oriented, No acute distress Cardiovascular: Regular rate  Respiratory: Normal  respiratory effort  Neurologic: Grossly intact, no focal deficits Psychiatric: Normal mood and affect  I have reviewed prior pt notes  I have reviewed urinalysis results    Impression/Assessment:  1.  Bladder calculus  2.  Bladder neck contracture  Plan:  Cystoscopy, cystolitholapaxy, Optilume management of bladder neck contracture/stricture.

## 2021-12-17 NOTE — Op Note (Signed)
Preoperative diagnosis: Bladder neck contracture, bladder neck stone 23 mm in size  Postoperative diagnosis: Same  Principal procedure cystoscopy, cystolitholapaxy 23 mm bladder neck stone, Optilume treatment of bladder neck contracture (30 [place] by 5 cm), use of fluoroscopy for under 1 hour  Surgeon: Giorgi Debruin  Anesthesia: General with LMA  Complications: None  Estimated blood loss: Less than 10 mL  Specimen: Stone fragments  Indications: 76 year old male with history of group 5 adenocarcinoma.  He completed 2 years of ADT as well as combination radiotherapy.  Radiotherapy was completed in 2018.  Following that, he was in urinary retention for quite a few months.  He underwent UroLift procedure in March followed by TURP in August, 2019.  He has not had issues with retention since that time, mainly overactive bladder.  In 2021 he was treated urgently for a bladder neck stone with obstruction.  Recent follow-up because of voiding symptoms included cystoscopy which revealed reformation of a stone at his bladder neck, dystrophic in nature as well as bladder neck contracture.  He has been voiding on his own.  Because of the stone, measuring approximately 23 mm, as well as his bladder neck contracture, I have recommended cystolitholapaxy as well as balloon dilation of this bladder neck contracture using the Optilume chemotherapy impregnated balloon.  He understands the procedure, the expected outcomes, risks and complications and desires to proceed.  Findings: Anterior urethra was normal.  Bulbous urethral revealed no stricture.  Within the prostatic urethra, there was a moderate bladder neck contracture proximally, as well as a dystrophic calcification on the right and left prostatic walls.  The right side had a UroLift staple within it.  The urothelium of the bladder was slightly hyperemic consistent with prior radiotherapy, but no evident urothelial lesions were noted.  Description of procedure:  The patient was properly identified in the holding area.  He received preoperative IV antibiotics.  Is taken to the operating room where general anesthetic was administered with the LMA.  He is placed in the dorsolithotomy position.  Genitalia and perineum were prepped, draped, proper timeout performed.  74 French panendoscope passed under direct vision through his urethra.  Once the prostatic urethra was noted, the dystrophic calcifications were noted posteriorly.  No anterior urothelial lesions or stones were noted.  Within the right stone was a stable from the [place].  It was the urethral end.  Once the bladder was thoroughly inspected, I brought the scope back to the prostatic urethra.  Using a 1000 m fiber and laser energy set at 1.8 J and 30 Hz, the stone was treated.  The urethral tab of the UroLift was easily broken free.  The suture underneath was ablated down to the prostatic surface.  The obstructing, or soon-to-be obstructing stones were easily fragmented and rinsed into the bladder.  It was evident that a lot of the stone did go into the prostatic parenchyma and I used care not to get too deep within the prostate.  During the procedure, I did come across an old brachytherapy seed that was grasped and removed and saved separately, sent to radiation oncology.  It had been 5 years since it was placed, so it was very little risk of any remaining radiation from this.  The stone was treated until either no stone was noted in the parenchyma or the stone was level with the surrounding parenchyma.  Several small calcific areas were noted, but I used care not to be too aggressive and get too far posteriorly with the  laser energy.  The stone fragments were then rinsed from the bladder and saved for specimen.  Once the cystolitholapaxy was completed, I passed a sensor tip guidewire.  The Optilume catheter was advanced and properly position using fluoroscopic guidance.  Once adequately positioned, optimal  position was confirmed using direct cystoscopy.  It was then filled with contrast agent to 12 atm of pressure.  It was left inflated for 8 minutes.  Following this, it was deflated and removed.  I then passed the cystoscope to check both the prostatic urethra and the bladder.  Once I confirmed that there was not significant bleeding and all fragments were removed, the scope was removed.  I then passed an 28 French Foley catheter, balloon filled with 10 cc of water.  Was hooked to dependent drainage.  The procedure at this point was terminated.  The patient was awakened, taken to the PACU in stable condition, having tolerated the procedure well.  He will be followed in the office in 4 days for voiding trial.

## 2021-12-18 ENCOUNTER — Encounter (HOSPITAL_BASED_OUTPATIENT_CLINIC_OR_DEPARTMENT_OTHER): Payer: Self-pay | Admitting: Urology

## 2021-12-20 ENCOUNTER — Ambulatory Visit: Payer: PPO

## 2021-12-21 DIAGNOSIS — N210 Calculus in bladder: Secondary | ICD-10-CM | POA: Diagnosis not present

## 2021-12-25 ENCOUNTER — Ambulatory Visit: Payer: PPO | Attending: Psychiatry

## 2021-12-25 ENCOUNTER — Other Ambulatory Visit (INDEPENDENT_AMBULATORY_CARE_PROVIDER_SITE_OTHER): Payer: Self-pay

## 2021-12-25 DIAGNOSIS — Z0289 Encounter for other administrative examinations: Secondary | ICD-10-CM

## 2021-12-25 DIAGNOSIS — R2681 Unsteadiness on feet: Secondary | ICD-10-CM | POA: Insufficient documentation

## 2021-12-25 DIAGNOSIS — G629 Polyneuropathy, unspecified: Secondary | ICD-10-CM

## 2021-12-25 DIAGNOSIS — R42 Dizziness and giddiness: Secondary | ICD-10-CM | POA: Diagnosis not present

## 2021-12-25 NOTE — Therapy (Signed)
OUTPATIENT PHYSICAL THERAPY NEURO TREATMENT and D/C Summary   Patient Name: Dylan Woods MRN: [id number] DOB:1945/08/30, 76 y.o., male Today's Date: 12/25/2021   PCP: Shon Baton REFERRING PROVIDER: Genia Harold, MD  PHYSICAL THERAPY DISCHARGE SUMMARY  Visits from Start of Care: 8  Current functional level related to goals / functional outcomes: Goals met   Remaining deficits: none   Education / Equipment: HEP   Patient agrees to discharge. Patient goals were met. Patient is being discharged due to meeting the stated rehab goals.   PT End of Session - 12/25/21 1449     Visit Number 8    Number of Visits 8    Date for PT Re-Evaluation 01/03/22    Authorization Type [employer]    PT Start Time 1445    PT Stop Time 1530    PT Time Calculation (min) 45 min    Activity Tolerance Patient tolerated treatment well    Behavior During Therapy [facility] for tasks assessed/performed               Past Medical History:  Diagnosis Date   Anemia    with prostate cancer treatment   Asthma    as a child   Bladder outlet obstruction    Bladder stones    Coronary artery disease    Degenerative arthritis    Diverticulosis    Heart murmur    years ago per pt on 12-11-2021   History of chest pain 02/2021   per pt and epic documentation -- cardiac work-up done ---   History of radiation therapy 2018   boost w/ radioactive prostate seed implants, 110Gy; then IMRT  45Gy in 25 fractions , 01-20-2017 to 02-24-2017   History of urinary retention 12/23/2016   post surgery 12-18-2016   Hyperlipidemia    on crestor   Hyperplasia of prostate with lower urinary tract symptoms (LUTS)    Intermittent palpitations    mostly at night--  cardiologist visit w/ dr Marlou Porch 08-07-2017 in epic, ekg showed PACs   Internal hemorrhoids    Migraines    neurologist-  dr [name]   Neuropathy    both feet   Osteopenia    PAC (premature atrial contraction)    Prostate cancer [facility])  urologist-  dr dahlstedt/  oncologist-  dr [name]   dx 03-29-2016-- Stage T1c, Gleason 4+5, PSA 5.06, vol 74.51cc--- Hormone therapy (lupron injection) prostate decreased to 47cc; 12-18-2016  radiactive seed implants;  completed external beam radiation 02-24-2017 PTNS injections.    Past Surgical History:  Procedure Laterality Date   BLADDER STONE REMOVAL  2021   CARDIOVASCULAR STRESS TEST  06-19-2016   dr Marlou Porch   Intermediate risk nuclear study w/ no reversible ischemia with moderate size and intensity fixed inferoseptal and septal perfusion defect, consider artifact scar/  LVEF nuclear stress 48% (45-54%)  with septal hypokinesis to dyskinesis   CATARACT EXTRACTION W/ INTRAOCULAR LENS  IMPLANT, BILATERAL  2011;  2004   CIRCUMCISION  age 74   colonscopy  11/16/2021   CYSTOSCOPY N/A 12/18/2016   Procedure: CYSTOSCOPY FLEXIBLE;  Surgeon: [name], MD;  Location: [facility];  Service: Urology;  Laterality: N/A;  NO SEEDS FOUND IN BLADDER   CYSTOSCOPY N/A 02/11/2020   Procedure: CYSTOSCOPY bladder stone extraction placement of foley catheter;  Surgeon: Ceasar Mons, MD;  Location: [facility];  Service: Urology;  Laterality: N/A;   CYSTOSCOPY WITH INSERTION OF UROLIFT  03/ 2019   dr [name]  w/ anesthesia @ [facility]   CYSTOSCOPY WITH LITHOLAPAXY N/A 12/17/2021   Procedure: CYSTOSCOPY WITH LITHOLAPAXY;  Surgeon: [name], MD;  Location: [facility];  Service: Urology;  Laterality: N/A;  1 HR   CYSTOSCOPY WITH URETHRAL DILATATION N/A 12/17/2021   Procedure: CYSTOSCOPY WITH  OPTILUME TREATMENT OF URETHRAL DILATATION;  Surgeon: [name], MD;  Location: [facility];  Service: Urology;  Laterality: N/A;   GOLD SEED IMPLANT N/A 12/18/2016   Procedure: GOLD SEED IMPLANT;  Surgeon: [name], MD;  Location: [facility];  Service: Urology;  Laterality: N/A;   3 MARKERS  IMPLANTED    INGUINAL HERNIA REPAIR Right 1992   KNEE ARTHROSCOPY Left 2003;  2008   LEFT HEART CATH AND CORONARY ANGIOGRAPHY N/A 07/20/2021   Procedure: LEFT HEART CATH AND CORONARY ANGIOGRAPHY;  Surgeon: Martinique, Peter M, MD;  Location: [facility];  Service: Cardiovascular;  Laterality: N/A;   NASAL SINUS SURGERY  2015   PROSTATE BIOPSY  03-29-2016   dr Pilar Jarvis (office)   RADIOACTIVE SEED IMPLANT N/A 12/18/2016   Procedure: RADIOACTIVE SEED IMPLANT/BRACHYTHERAPY IMPLANT;  Surgeon: [name], MD;  Location: [facility];  Service: Urology;  Laterality: N/A;    62  SEEDS IMPLANTED   rt foot metatarsal Right    SPACE OAR INSTILLATION N/A 12/18/2016   Procedure: SPACE OAR INSTILLATION;  Surgeon: [name], MD;  Location: [facility];  Service: Urology;  Laterality: N/A;   TRANSURETHRAL RESECTION OF PROSTATE N/A 10/31/2017   Procedure: TRANSURETHRAL RESECTION OF THE PROSTATE (TURP);  Surgeon: [name], MD;  Location: [facility];  Service: Urology;  Laterality: N/A;   VITRECTOMY Left 09/2021   with lens implants   Patient Active Problem List   Diagnosis Date Noted   Precordial pain 07/05/2021   Urinary retention 02/11/2020   Memory loss 07/19/2019   Chronic headaches 07/23/2018   Hyperplasia of prostate with urinary obstruction 10/31/2017   Increased urinary frequency 09/24/2017   OAB (overactive bladder) 09/24/2017   Urinary urgency 09/24/2017   PAC (premature atrial contraction) 06/17/2016   Palpitations 05/22/2016   Malignant neoplasm of prostate ([place]) 05/22/2016   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   Somnolence, daytime 12/27/2014   Common migraine 06/21/2014   Inguinal hernia, left 09/23/2013   Headache 02/10/2013    ONSET DATE: "for a while"  REFERRING DIAG: R26.89 (ICD-10-CM) - Imbalance   THERAPY DIAG:  Unsteadiness on feet  Dizziness and giddiness  Rationale for Evaluation and Treatment  Rehabilitation  SUBJECTIVE:  SUBJECTIVE STATEMENT:   Recovering fro recent surgery, no issues Pt accompanied by: self  PERTINENT HISTORY: He is concerned because he has noticed worsening balance. Denies leg weakness or dizziness, just feels he is unstable. This is particularly bad when he closes his eyes in the shower. Has not had any falls   PAIN:  Are you having pain? No  PRECAUTIONS: None  WEIGHT BEARING RESTRICTIONS No  FALLS: Has patient fallen in last 6 months? No  LIVING ENVIRONMENT: Lives with: lives alone Lives in: House/apartment Stairs: Yes: Internal: 12 steps; can reach both and External: 12 steps; on right going up Has following equipment at home: None  PLOF: Independent  PATIENT GOALS be able to climb steps with less reliance on HR, be able to walk on trails if possilbe     OBJECTIVE:   TODAY'S TREATMENT: 12/25/21 Activity Comments  M-CTSIB Mild sway x 30 sec  Stair ambulation No HR and reciprocal pattern, occasional touch on HR  Prone quad stretch                Access Code: [email or web address] URL: [email or web address] Date: 11/27/2021-last update Prepared by: [facility]  Exercises - Tandem Stance with Eyes Closed in Corner  - 1 x daily - 7 x weekly - 3 sets - 30 sec hold - Corner Balance Feet Together With Eyes Closed  - 1 x daily - 7 x weekly - 3 sets - 30 sec hold - Forward Step Down with Heel Tap and Counter Support  - 1 x daily - 5 x weekly - 2 sets - 10 reps - Forward Step Up  - 1 x daily - 5 x weekly - 2 sets - 10 reps - Side Stepping with Resistance at Ankles and Counter Support  - 1 x daily - 5 x weekly - 1 sets - 3-5 reps  PATIENT EDUCATION: Education details: Updates/additions to [employer] Person educated:  Patient Education method: Consulting civil engineer, Media planner, and Handouts Education comprehension: verbalized understanding and returned demonstration   ----------------------------------------------------------------------------------- FROM EVAL:   DIAGNOSTIC FINDINGS: MRI images were reviewed (no report), brain MRI was normal and he did appear to have left foraminal narrowing on MRI C-spine. Has not had any more episodes of paresthesias and denies neck pain at this time   COGNITION: Overall cognitive status: Within functional limits for tasks assessed   SENSATION: DNT, pt reports nerve conduction test is to take place  COORDINATION: WNL  EDEMA:  none  MUSCLE TONE: WNL   POSTURE: decreased lumbar lordosis and flexed trunk   LOWER EXTREMITY ROM:     WNL   LOWER EXTREMITY MMT:    MMT Right Eval Left Eval  Hip flexion 5 5  Hip extension    Hip abduction 5 5  Hip adduction    Hip internal rotation    Hip external rotation    Knee flexion 5 5  Knee extension 5 5  Ankle dorsiflexion 5 5  Ankle plantarflexion    Ankle inversion    Ankle eversion    --Gross LE strength tested with pt in sitting  BED MOBILITY:  indep  TRANSFERS: independent   STAIRS:  Level of Assistance: Modified independence  Stair Negotiation Technique: Alternating Pattern  with Single Rail on Right  Number of Stairs: 10   Height of Stairs: 4-6"  Comments:   GAIT: Gait pattern: WFL Distance walked:  Assistive device utilized: None Level of assistance: Complete Independence Comments:   FUNCTIONAL TESTs:  Berg Balance Scale: 53/56 Dynamic Gait Index: 21/24    Ocular assessment:  Saccades: WNL Smooth pursuits: WNL Spontaneous nystagmus: absent Head Impulse Test: +Right; -Left VOR: to be tested VOR cancellation: to be tested   M-CTSIB  Condition 1: Firm Surface, EO 30 Sec, Normal Sway  Condition 2: Firm Surface, EC 30 Sec, Normal and Mild Sway  Condition 3: Foam Surface, EO 30  Sec, Normal Sway  Condition 4: Foam Surface, EC 9 Sec, Severe Sway     TODAY'S TREATMENT:  Evaluation, standing balance for HEP   PATIENT EDUCATION: Education details: assessment findings Person educated: Patient Education method: Theatre stage manager Education comprehension: verbalized understanding   HOME EXERCISE PROGRAM: Access Code: [email or web address] URL: [email or web address] Date: 10/25/2021 Prepared by: Sherlyn Lees  Exercises - Tandem Stance with Eyes Closed in Corner  - 1 x daily - 7 x weekly - 3 sets - 30 sec hold - Corner Balance Feet Together With Eyes Closed  - 1 x daily - 7 x weekly - 3 sets - 30 sec hold - Forward Step Down  - 7 x weekly - 5 sets - 5 reps - Gaze Stability (VOR) x1 Forward Ambulation With Distant Target With Horizontal Head Turns  - 1 x daily - 7 x weekly -------------------------------------------------------------------------------------------------  GOALS: Goals reviewed with patient? Yes  SHORT TERM GOALS: Target date: 11/22/2021  The patient will be independent with HEP for gaze adaptation, habituation, balance, and general mobility. Baseline: Goal status: MET    LONG TERM GOALS: Target date: 01/03/2022  Demonstrate improved balance/vestibular function as evidenced by mild sway condition 4 M-CTSIB x 30 sec Baseline: severe with LOB x 9 sec; mild sway x 30 sec Goal status: MET  2.  Demonstrate improved balance and safety with gait per score 24/24 Dynamic Gait Index Baseline: 21/24, main deficits with head turns whilst walking; 24/24 Goal status: MET  3.  Demonstrate/report improved balance per ability to ambulate 12 stairs without use of HR Baseline: single HR required Goal status: MET    ASSESSMENT:  CLINICAL IMPRESSION: Overall improved since start of care and notes no unsteadiness or LOB experienced. No symptoms of dizziness or motion sensitivity.  Able to improve stair ambulation but pt finds still experiencing  difficulty with descending stairs.  Quadricep flexibility reveals approx 12.5 inches from heels to buttocks in prone stretch position. Provided with updated HEP to address flexibility deficits, will D/C to HEP  OBJECTIVE IMPAIRMENTS decreased balance, decreased knowledge of condition, difficulty walking, dizziness, and postural dysfunction.   ACTIVITY LIMITATIONS carrying, stairs, and locomotion level  PARTICIPATION LIMITATIONS: community activity and yard work  PERSONAL FACTORS Age, Time since onset of injury/illness/exacerbation, and 1 comorbidity: hx of prostate CA, cardiac  are also affecting patient's functional outcome.   REHAB POTENTIAL: Excellent  CLINICAL DECISION MAKING: Evolving/moderate complexity  EVALUATION COMPLEXITY: Moderate  PLAN: PT FREQUENCY: 1x/week  PT DURATION: 10 weeks  PLANNED INTERVENTIONS: Therapeutic exercises, Therapeutic activity, Neuromuscular re-education, Balance training, Gait training, Patient/Family education, Joint mobilization, Stair training, Vestibular training, Canalith repositioning, DME instructions, Aquatic Therapy, Dry Needling, Cryotherapy, Moist heat, Biofeedback, and Manual therapy  PLAN FOR NEXT SESSION: D/C to HEP  2:49 PM, 12/25/21 M. Sherlyn Lees, PT, DPT Physical Therapist- [place] Office Number: [phone]    [place] [facility] [address] [place], [place] [address] Phone # [phone] Fax # [phone]

## 2022-01-01 LAB — ANA+ENA+DNA/DS+SCL 70+SJOSSA/B
ANA Titer 1: NEGATIVE
ENA RNP Ab: <0.2 AI (ref 0.0–0.9)
ENA SM Ab Ser-aCnc: <0.2 AI (ref 0.0–0.9)
ENA SSA (RO) Ab: <0.2 AI (ref 0.0–0.9)
ENA SSB (LA) Ab: <0.2 AI (ref 0.0–0.9)
Scleroderma (Scl-70) (ENA) Antibody, IgG: <0.2 AI (ref 0.0–0.9)
dsDNA Ab: <1 IU/mL (ref 0–9)

## 2022-01-01 LAB — LYME DISEASE SEROLOGY W/REFLEX: Lyme Total Antibody EIA: NEGATIVE

## 2022-01-01 LAB — ANCA PROFILE
Anti-MPO Antibodies: <0.2 units (ref 0.0–0.9)
Anti-PR3 Antibodies: <0.2 units (ref 0.0–0.9)
Atypical pANCA: <1:20 {titer}
C-ANCA: <1:20 {titer}
P-ANCA: <1:20 {titer}

## 2022-01-01 LAB — RHEUMATOID FACTOR: Rheumatoid fact SerPl-aCnc: 12.5 IU/mL (ref <14.0)

## 2022-01-01 LAB — GM1 IGG AUTOANTIBODIES: GM1 IgG Autoantibodies: <20 % (ref 0–30)

## 2022-01-01 LAB — INTERPRETATION

## 2022-01-01 LAB — HIV ANTIBODY (ROUTINE TESTING W REFLEX): HIV Screen 4th Generation wRfx: NONREACTIVE

## 2022-01-02 NOTE — Addendum Note (Signed)
Addended by: Genia Harold on: 01/02/2022 03:42 PM   Modules accepted: Orders

## 2022-01-08 DIAGNOSIS — F419 Anxiety disorder, unspecified: Secondary | ICD-10-CM | POA: Diagnosis not present

## 2022-01-08 DIAGNOSIS — M810 Age-related osteoporosis without current pathological fracture: Secondary | ICD-10-CM | POA: Diagnosis not present

## 2022-01-08 DIAGNOSIS — I951 Orthostatic hypotension: Secondary | ICD-10-CM | POA: Diagnosis not present

## 2022-01-08 DIAGNOSIS — E041 Nontoxic single thyroid nodule: Secondary | ICD-10-CM | POA: Diagnosis not present

## 2022-01-08 DIAGNOSIS — R7989 Other specified abnormal findings of blood chemistry: Secondary | ICD-10-CM | POA: Diagnosis not present

## 2022-01-08 DIAGNOSIS — Z125 Encounter for screening for malignant neoplasm of prostate: Secondary | ICD-10-CM | POA: Diagnosis not present

## 2022-01-08 DIAGNOSIS — D649 Anemia, unspecified: Secondary | ICD-10-CM | POA: Diagnosis not present

## 2022-01-10 DIAGNOSIS — R82998 Other abnormal findings in urine: Secondary | ICD-10-CM | POA: Diagnosis not present

## 2022-01-15 DIAGNOSIS — Z23 Encounter for immunization: Secondary | ICD-10-CM | POA: Diagnosis not present

## 2022-01-15 DIAGNOSIS — R002 Palpitations: Secondary | ICD-10-CM | POA: Diagnosis not present

## 2022-01-15 DIAGNOSIS — R490 Dysphonia: Secondary | ICD-10-CM | POA: Diagnosis not present

## 2022-01-15 DIAGNOSIS — M810 Age-related osteoporosis without current pathological fracture: Secondary | ICD-10-CM | POA: Diagnosis not present

## 2022-01-15 DIAGNOSIS — Z1331 Encounter for screening for depression: Secondary | ICD-10-CM | POA: Diagnosis not present

## 2022-01-15 DIAGNOSIS — E785 Hyperlipidemia, unspecified: Secondary | ICD-10-CM | POA: Diagnosis not present

## 2022-01-15 DIAGNOSIS — G629 Polyneuropathy, unspecified: Secondary | ICD-10-CM | POA: Diagnosis not present

## 2022-01-15 DIAGNOSIS — R2689 Other abnormalities of gait and mobility: Secondary | ICD-10-CM | POA: Diagnosis not present

## 2022-01-15 DIAGNOSIS — Z1389 Encounter for screening for other disorder: Secondary | ICD-10-CM | POA: Diagnosis not present

## 2022-01-15 DIAGNOSIS — Z0000 Encounter for general adult medical examination without abnormal findings: Secondary | ICD-10-CM | POA: Diagnosis not present

## 2022-01-15 DIAGNOSIS — E041 Nontoxic single thyroid nodule: Secondary | ICD-10-CM | POA: Diagnosis not present

## 2022-01-15 DIAGNOSIS — G44209 Tension-type headache, unspecified, not intractable: Secondary | ICD-10-CM | POA: Diagnosis not present

## 2022-01-15 DIAGNOSIS — I2510 Atherosclerotic heart disease of native coronary artery without angina pectoris: Secondary | ICD-10-CM | POA: Diagnosis not present

## 2022-01-16 DIAGNOSIS — Z8546 Personal history of malignant neoplasm of prostate: Secondary | ICD-10-CM | POA: Diagnosis not present

## 2022-01-16 DIAGNOSIS — N210 Calculus in bladder: Secondary | ICD-10-CM | POA: Diagnosis not present

## 2022-01-22 DIAGNOSIS — M810 Age-related osteoporosis without current pathological fracture: Secondary | ICD-10-CM | POA: Diagnosis not present

## 2022-01-30 ENCOUNTER — Other Ambulatory Visit: Payer: Self-pay | Admitting: Cardiology

## 2022-01-30 ENCOUNTER — Encounter: Payer: Self-pay | Admitting: Neurology

## 2022-01-30 ENCOUNTER — Ambulatory Visit: Payer: PPO | Admitting: Neurology

## 2022-01-30 ENCOUNTER — Telehealth: Payer: Self-pay | Admitting: Neurology

## 2022-01-30 VITALS — BP 124/79 | HR 51 | Ht 71.6 in | Wt 187.5 lb

## 2022-01-30 DIAGNOSIS — R269 Unspecified abnormalities of gait and mobility: Secondary | ICD-10-CM | POA: Diagnosis not present

## 2022-01-30 DIAGNOSIS — R202 Paresthesia of skin: Secondary | ICD-10-CM

## 2022-01-30 NOTE — Telephone Encounter (Signed)
[employer] NPR sent to [facility] [phone] 

## 2022-01-30 NOTE — Progress Notes (Signed)
Chief Complaint  Patient presents with   New Patient (Initial Visit)    Rm 15. Alone. Internal referral from Dr Billey Gosling for demyelinating/axonal neuropathy of unknown etiology.      ASSESSMENT AND PLAN  Dylan Woods is a 76 y.o. male  Gait abnormality  Length-dependent sensory changes, well preserved to pinprick, but decreased vibratory sensation to knee level, mild left more than right toe extension, flexion weakness, brisk reflex of bilateral upper extremity, patellar, absent ankle reflex, stiff, cautious gait,  His gait abnormality likely multifactorial, peripheral neuropathy, likely a component of lumbar radiculopathy, could not rule out a possibility of cervical/thoracic myelopathy,  We personally reviewed CT angiogram of head and neck in November 2022, mild cervical degenerative disease, I did not see significant canal stenosis,  He has a history of osteoporosis, reported lumbar compression fracture, though I did not see the film, will proceed with MRI of thoracic spine to rule out thoracic myelopathy  EMG nerve conduction study   DIAGNOSTIC DATA (LABS, IMAGING, TESTING) - I reviewed patient records, labs, notes, testing and imaging myself where available.   MEDICAL HISTORY:  Dylan Woods, is a 76 year old male seen in request by Dr. Genia Harold, for evaluation of worsening paresthesia, gait abnormality, EMG nerve conduction study, his primary care physician is Dr. Virgina Jock, Jenny Reichmann  I reviewed and summarized the referring note. PMhx. Osteoporosis Prostate Cancer HTN HLD CAD  He noticed gradual onset of numbness tingling of bilateral feet since 2022, he denies significant pain, paresthesia mainly at bottom of his feet, and noticed balance issues since 2022, also noticed better urgency, denies incontinence  He has a history of osteoporosis, reported incidental findings of lumbar compression fracture, he remains active,  Extensive laboratory evaluation since 23,  normal or negative ANCA profile, ANA, rheumatoid factor, Lyme titer, HIV, GM1 antibody, CMP, B12, protein pheresis, lipid panel, CBC, A1c 5.6,  Personally reviewed CT angiogram head and neck November 2022 for evaluation of his complaint of headache, dizziness, no significant large vessel disease  CT cervical spine showed mild degenerative changes no significant canal stenosis   PHYSICAL EXAM:   Vitals:   01/30/22 0931  BP: 124/79  Pulse: (!) 51  Weight: 187 lb 8 oz (85 kg)  Height: 5' 11.6" (1.819 m)   Not recorded     Body mass index is 25.71 kg/m.  PHYSICAL EXAMNIATION:  Gen: NAD, conversant, well nourised, well groomed                     Cardiovascular: Regular rate rhythm, no peripheral edema, warm, nontender. Eyes: Conjunctivae clear without exudates or hemorrhage Neck: Supple, no carotid bruits. Pulmonary: Clear to auscultation bilaterally   NEUROLOGICAL EXAM:  MENTAL STATUS: Speech/cognition: Awake, alert, oriented to history taking and casual conversation CRANIAL NERVES: CN II: Visual fields are full to confrontation. Pupils are round equal and briskly reactive to light. CN III, IV, VI: extraocular movement are normal. No ptosis. CN V: Facial sensation is intact to light touch CN VII: Face is symmetric with normal eye closure  CN VIII: Hearing is normal to causal conversation. CN IX, X: Phonation is normal. CN XI: Head turning and shoulder shrug are intact  MOTOR: Mild left more than right toe extension, flexion weakness, mild bilateral hip flexion weakness  REFLEXES: Reflexes are 2+ and symmetric at the biceps, triceps, knees, and absent at ankles. Plantar responses are extensor bilaterally  SENSORY: Well-preserved to light touch, pinprick, decreased vibratory sensation to knee level  COORDINATION: There is no trunk or limb dysmetria noted.  GAIT/STANCE: He can get up from seated position arm crossed, stiff, cautious,  REVIEW OF SYSTEMS:  Full 14  system review of systems performed and notable only for as above All other review of systems were negative.   ALLERGIES: Allergies  Allergen Reactions   Ampicillin Rash    Has patient had a PCN reaction causing immediate rash, facial/tongue/throat swelling, SOB or lightheadedness with hypotension: Yes Has patient had a PCN reaction causing severe rash involving mucus membranes or skin necrosis: No Has patient had a PCN reaction that required hospitalization: No Has patient had a PCN reaction occurring within the last 10 years: No If all of the above answers are "NO", then may proceed with Cephalosporin use.    Ketorolac Other (See Comments)    "Makes him feel like he is crazy"   Betadine [Povidone Iodine] Other (See Comments)    " irritates skin, burns and stings"   Povidone-Iodine     Other reaction(s): Unknown   Soy Allergy     Upsets bladder     HOME MEDICATIONS: Current Outpatient Medications  Medication Sig Dispense Refill   acetaminophen (TYLENOL) 500 MG tablet Take 1,000 mg by mouth every 6 (six) hours as needed for moderate pain.     acetaminophen-codeine (TYLENOL #3) 300-30 MG tablet Take 1 tablet by mouth daily as needed (headaches).     alendronate (FOSAMAX) 70 MG tablet Take 1 tablet by mouth every Saturday.     ALPRAZolam (XANAX) 0.25 MG tablet Take 0.25 mg by mouth at bedtime as needed for anxiety.     Ascorbic Acid (VITAMIN C) 1000 MG tablet Take 2,000 mg by mouth daily.     aspirin EC 81 MG tablet Take 81 mg by mouth daily. Swallow whole.     brimonidine (ALPHAGAN) 0.2 % ophthalmic solution Place 1 drop into the left eye in the morning and at bedtime.     Calcium Carbonate-Vit D-Min (CALCIUM 600+D PLUS MINERALS) 600-400 MG-UNIT TABS Take 1 tablet by mouth 2 (two) times daily.     Calcium Glycerophosphate (PRELIEF PO) Take 3-4 tablets by mouth 3 (three) times daily as needed (overactive bladder).     desmopressin (DDAVP) 0.2 MG tablet Take 0.2 mg by mouth at  bedtime.     diphenhydramine-acetaminophen (TYLENOL PM) 25-500 MG TABS tablet Take 1 tablet by mouth at bedtime as needed (sleep/pain).     Ferrous Sulfate (IRON SUPPLEMENT PO) Take 2 capsules by mouth 2 (two) times daily. [facility]  gummy 10 mg x 2 tabs bid     ketoconazole (NIZORAL) 2 % cream Apply 1 application topically daily as needed for irritation.      loratadine (CLARITIN) 10 MG tablet Take 10 mg by mouth daily.     Menthol, Topical Analgesic, (BIOFREEZE EX) Apply 1 application. topically daily as needed (pain).     METAMUCIL FIBER PO Take 1 Dose by mouth 2 (two) times daily.     metoprolol succinate (TOPROL-XL) 50 MG 24 hr tablet Take 1 tablet (50 mg total) by mouth at bedtime. Take with or immediately following a meal. 30 tablet 6   Multiple Vitamins-Minerals (ADULT GUMMY) CHEW Chew 1 capsule by mouth daily.     OVER THE COUNTER MEDICATION Take 1.5 Scoops by mouth 2 (two) times daily. sprout living simple pea protein otc supplement powder     OVER THE COUNTER MEDICATION at bedtime. Nature Made Time Release Tablets Melatonin and L-Theanine +GBA 1.2 tab  at qhs     Propylene Glycol (SYSTANE COMPLETE OP) Place 1 drop into both eyes. 5 times per day     rosuvastatin (CRESTOR) 5 MG tablet Take 5 mg by mouth daily.     solifenacin (VESICARE) 5 MG tablet Take 5 mg by mouth daily.     sulfamethoxazole-trimethoprim (BACTRIM DS) 800-160 MG tablet Take 1 tablet by mouth 2 (two) times daily. 6 tablet 0   valACYclovir (VALTREX) 1000 MG tablet Take 1,000 mg by mouth daily as needed (for cold sores).      Vibegron (GEMTESA) 75 MG TABS Take 75 mg by mouth daily.     No current facility-administered medications for this visit.    PAST MEDICAL HISTORY: Past Medical History:  Diagnosis Date   Anemia    with prostate cancer treatment   Asthma    as a child   Bladder outlet obstruction    Bladder stones    Coronary artery disease    Degenerative arthritis    Diverticulosis    Heart murmur     years ago per pt on 12-11-2021   History of chest pain 02/2021   per pt and epic documentation -- cardiac work-up done ---   History of radiation therapy 2018   boost w/ radioactive prostate seed implants, 110Gy; then IMRT  45Gy in 25 fractions , 01-20-2017 to 02-24-2017   History of urinary retention 12/23/2016   post surgery 12-18-2016   Hyperlipidemia    on crestor   Hyperplasia of prostate with lower urinary tract symptoms (LUTS)    Intermittent palpitations    mostly at night--  cardiologist visit w/ dr Marlou Porch 08-07-2017 in epic, ekg showed PACs   Internal hemorrhoids    Migraines    neurologist-  dr [name]   Neuropathy    both feet   Osteopenia    PAC (premature atrial contraction)    Prostate cancer [facility]) urologist-  dr dahlstedt/  oncologist-  dr [name]   dx 03-29-2016-- Stage T1c, Gleason 4+5, PSA 5.06, vol 74.51cc--- Hormone therapy (lupron injection) prostate decreased to 47cc; 12-18-2016  radiactive seed implants;  completed external beam radiation 02-24-2017 PTNS injections.     PAST SURGICAL HISTORY: Past Surgical History:  Procedure Laterality Date   BLADDER STONE REMOVAL  2021   CARDIOVASCULAR STRESS TEST  06-19-2016   dr Marlou Porch   Intermediate risk nuclear study w/ no reversible ischemia with moderate size and intensity fixed inferoseptal and septal perfusion defect, consider artifact scar/  LVEF nuclear stress 48% (45-54%)  with septal hypokinesis to dyskinesis   CATARACT EXTRACTION W/ INTRAOCULAR LENS  IMPLANT, BILATERAL  2011;  2004   CIRCUMCISION  age 43   colonscopy  11/16/2021   CYSTOSCOPY N/A 12/18/2016   Procedure: CYSTOSCOPY FLEXIBLE;  Surgeon: [name], MD;  Location: [facility];  Service: Urology;  Laterality: N/A;  NO SEEDS FOUND IN BLADDER   CYSTOSCOPY N/A 02/11/2020   Procedure: CYSTOSCOPY bladder stone extraction placement of foley catheter;  Surgeon: Ceasar Mons, MD;  Location: [facility];  Service: Urology;   Laterality: N/A;   CYSTOSCOPY WITH INSERTION OF UROLIFT  03/ 2019   dr [name]  w/ anesthesia @ [facility]   CYSTOSCOPY WITH LITHOLAPAXY N/A 12/17/2021   Procedure: CYSTOSCOPY WITH LITHOLAPAXY;  Surgeon: [name], MD;  Location: [facility];  Service: Urology;  Laterality: N/A;  1 HR   CYSTOSCOPY WITH URETHRAL DILATATION N/A 12/17/2021   Procedure: CYSTOSCOPY WITH  OPTILUME TREATMENT OF  URETHRAL DILATATION;  Surgeon: [name], MD;  Location: [facility];  Service: Urology;  Laterality: N/A;   GOLD SEED IMPLANT N/A 12/18/2016   Procedure: GOLD SEED IMPLANT;  Surgeon: [name], MD;  Location: [facility];  Service: Urology;  Laterality: N/A;   3 MARKERS  IMPLANTED   INGUINAL HERNIA REPAIR Right 1992   KNEE ARTHROSCOPY Left 2003;  2008   LEFT HEART CATH AND CORONARY ANGIOGRAPHY N/A 07/20/2021   Procedure: LEFT HEART CATH AND CORONARY ANGIOGRAPHY;  Surgeon: Martinique, Peter M, MD;  Location: [facility];  Service: Cardiovascular;  Laterality: N/A;   NASAL SINUS SURGERY  2015   PROSTATE BIOPSY  03-29-2016   dr Pilar Jarvis (office)   RADIOACTIVE SEED IMPLANT N/A 12/18/2016   Procedure: RADIOACTIVE SEED IMPLANT/BRACHYTHERAPY IMPLANT;  Surgeon: [name], MD;  Location: [facility];  Service: Urology;  Laterality: N/A;    62  SEEDS IMPLANTED   rt foot metatarsal Right    SPACE OAR INSTILLATION N/A 12/18/2016   Procedure: SPACE OAR INSTILLATION;  Surgeon: [name], MD;  Location: [facility];  Service: Urology;  Laterality: N/A;   TRANSURETHRAL RESECTION OF PROSTATE N/A 10/31/2017   Procedure: TRANSURETHRAL RESECTION OF THE PROSTATE (TURP);  Surgeon: [name], MD;  Location: [facility];  Service: Urology;  Laterality: N/A;   VITRECTOMY Left 09/2021   with lens implants    FAMILY HISTORY: Family History  Problem Relation Age of Onset   Cancer  Mother        lymphoma   Colon polyps Mother    Bladder Cancer Mother    Parkinsonism Father    Colon polyps Father    Cancer Brother        prostate ca s/p prostatectomy   Colon cancer Neg Hx    Esophageal cancer Neg Hx    Stomach cancer Neg Hx    Rectal cancer Neg Hx     SOCIAL HISTORY: Social History   Socioeconomic History   Marital status: Widowed    Spouse name: Not on file   Number of children: 1   Years of education: college   Highest education level: Not on file  Occupational History    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   Right handed.   College education.   Caffeine one cup daily coffee.   Social Determinants of Health   Financial Resource Strain: Not on file  Food Insecurity: Not on file  Transportation Needs: Not on file  Physical Activity: Not on file  Stress: Not on file  Social Connections: Not on file  Intimate Partner Violence: Not on file      Marcial Pacas, [name] Ph.D.  [facility] [address], [place], [place]  [address] Ph: [phone] Fax: [phone]  CC:  Genia Harold, MD [address], [address] [place],  [place] [address]  Shon Baton, MD

## 2022-02-05 DIAGNOSIS — M8589 Other specified disorders of bone density and structure, multiple sites: Secondary | ICD-10-CM | POA: Diagnosis not present

## 2022-02-13 ENCOUNTER — Ambulatory Visit
Admission: RE | Admit: 2022-02-13 | Discharge: 2022-02-13 | Disposition: A | Discharge status: Home / Self Care | Payer: PPO | Source: Ambulatory Visit | Attending: Neurology | Admitting: Neurology

## 2022-02-13 DIAGNOSIS — R202 Paresthesia of skin: Secondary | ICD-10-CM

## 2022-02-13 DIAGNOSIS — R269 Unspecified abnormalities of gait and mobility: Secondary | ICD-10-CM | POA: Diagnosis not present

## 2022-02-14 DIAGNOSIS — D3132 Benign neoplasm of left choroid: Secondary | ICD-10-CM | POA: Diagnosis not present

## 2022-02-14 DIAGNOSIS — H43811 Vitreous degeneration, right eye: Secondary | ICD-10-CM | POA: Diagnosis not present

## 2022-02-14 DIAGNOSIS — H35372 Puckering of macula, left eye: Secondary | ICD-10-CM | POA: Diagnosis not present

## 2022-03-28 DIAGNOSIS — C61 Malignant neoplasm of prostate: Secondary | ICD-10-CM | POA: Diagnosis not present

## 2022-04-05 DIAGNOSIS — N401 Enlarged prostate with lower urinary tract symptoms: Secondary | ICD-10-CM | POA: Diagnosis not present

## 2022-04-05 DIAGNOSIS — N320 Bladder-neck obstruction: Secondary | ICD-10-CM | POA: Diagnosis not present

## 2022-04-05 DIAGNOSIS — R351 Nocturia: Secondary | ICD-10-CM | POA: Diagnosis not present

## 2022-04-05 DIAGNOSIS — N329 Bladder disorder, unspecified: Secondary | ICD-10-CM | POA: Diagnosis not present

## 2022-04-05 DIAGNOSIS — R339 Retention of urine, unspecified: Secondary | ICD-10-CM | POA: Diagnosis not present

## 2022-04-05 DIAGNOSIS — N3946 Mixed incontinence: Secondary | ICD-10-CM | POA: Diagnosis not present

## 2022-04-05 DIAGNOSIS — N5201 Erectile dysfunction due to arterial insufficiency: Secondary | ICD-10-CM | POA: Diagnosis not present

## 2022-04-08 ENCOUNTER — Ambulatory Visit: Payer: PPO | Admitting: Psychiatry

## 2022-04-24 NOTE — Progress Notes (Unsigned)
   CC:  imbalance, migraines  Follow-up Visit  Last visit: 10/02/21  Brief HPI: 77 year old male with a history of prostate cancer, CAD who follows in clinic for migraines and imbalance. MRI brain was normal, MRI C-spine with mild left foraminal stenosis, no canal stenosis.  At his last visit he reported worsening imbalance. He was referred to PT and EMG was ordered.  Interval History: EMG showed mixed demyelinating/axonal neuropathy with no clear cause identified on blood work. He was referred for second opinion from Neuromuscular. Saw Dr. Krista Blue on 01/30/22 who ordered an MRI T-spine, which showed mild canal narrowing without significant compression at T9-10, T10-11, and T11-12. He is scheduled for repeat EMG later this month.  Headaches***   Physical Exam:   Vital Signs: There were no vitals taken for this visit. GENERAL:  well appearing, in no acute distress, alert  SKIN:  Color, texture, turgor normal. No rashes or lesions HEAD:  Normocephalic/atraumatic. RESP: normal respiratory effort MSK:  No gross joint deformities.   NEUROLOGICAL: Mental Status: Alert, oriented to person, place and time, Follows commands, and Speech fluent and appropriate. Cranial Nerves: PERRL, face symmetric, no dysarthria, hearing grossly intact Motor: moves all extremities equally Gait: normal-based.  IMPRESSION: ***  PLAN: ***   Follow-up: ***  I spent a total of *** minutes on the date of the service. Discussed medication side effects, adverse reactions and drug interactions. Written educational materials and patient instructions outlining all of the above were given.  Genia Harold, MD

## 2022-04-25 ENCOUNTER — Encounter: Payer: Self-pay | Admitting: Psychiatry

## 2022-04-25 ENCOUNTER — Telehealth: Payer: Self-pay | Admitting: Psychiatry

## 2022-04-25 ENCOUNTER — Ambulatory Visit (INDEPENDENT_AMBULATORY_CARE_PROVIDER_SITE_OTHER): Payer: PPO | Admitting: Psychiatry

## 2022-04-25 VITALS — BP 113/71 | HR 57 | Ht 72.0 in | Wt 188.0 lb

## 2022-04-25 DIAGNOSIS — G44319 Acute post-traumatic headache, not intractable: Secondary | ICD-10-CM

## 2022-04-25 DIAGNOSIS — R2689 Other abnormalities of gait and mobility: Secondary | ICD-10-CM | POA: Diagnosis not present

## 2022-04-25 NOTE — Telephone Encounter (Signed)
healthteam adv NPR sent to GI urgently [phone]

## 2022-05-01 ENCOUNTER — Ambulatory Visit
Admission: RE | Admit: 2022-05-01 | Discharge: 2022-05-01 | Disposition: A | Discharge status: Home / Self Care | Payer: PPO | Source: Ambulatory Visit | Attending: Psychiatry | Admitting: Psychiatry

## 2022-05-01 DIAGNOSIS — G44319 Acute post-traumatic headache, not intractable: Secondary | ICD-10-CM

## 2022-05-01 DIAGNOSIS — R519 Headache, unspecified: Secondary | ICD-10-CM | POA: Diagnosis not present

## 2022-05-01 DIAGNOSIS — S0990XA Unspecified injury of head, initial encounter: Secondary | ICD-10-CM | POA: Diagnosis not present

## 2022-05-03 ENCOUNTER — Other Ambulatory Visit (HOSPITAL_BASED_OUTPATIENT_CLINIC_OR_DEPARTMENT_OTHER): Payer: Self-pay

## 2022-05-03 MED ORDER — SILDENAFIL CITRATE 100 MG PO TABS
50.0000 mg | ORAL_TABLET | Freq: Every day | ORAL | 11 refills | Status: DC | PRN
  Filled 2022-05-03: qty 10, 10d supply, fill #0
  Filled 2022-08-05: qty 10, 10d supply, fill #1
  Filled 2022-11-09: qty 10, 10d supply, fill #2
  Filled 2023-01-16: qty 10, 10d supply, fill #3
  Filled 2023-04-24: qty 10, 10d supply, fill #4

## 2022-05-13 DIAGNOSIS — D225 Melanocytic nevi of trunk: Secondary | ICD-10-CM | POA: Diagnosis not present

## 2022-05-13 DIAGNOSIS — L718 Other rosacea: Secondary | ICD-10-CM | POA: Diagnosis not present

## 2022-05-13 DIAGNOSIS — D2262 Melanocytic nevi of left upper limb, including shoulder: Secondary | ICD-10-CM | POA: Diagnosis not present

## 2022-05-13 DIAGNOSIS — L821 Other seborrheic keratosis: Secondary | ICD-10-CM | POA: Diagnosis not present

## 2022-05-13 DIAGNOSIS — L309 Dermatitis, unspecified: Secondary | ICD-10-CM | POA: Diagnosis not present

## 2022-05-13 DIAGNOSIS — Z85828 Personal history of other malignant neoplasm of skin: Secondary | ICD-10-CM | POA: Diagnosis not present

## 2022-05-13 DIAGNOSIS — D1801 Hemangioma of skin and subcutaneous tissue: Secondary | ICD-10-CM | POA: Diagnosis not present

## 2022-05-13 DIAGNOSIS — L570 Actinic keratosis: Secondary | ICD-10-CM | POA: Diagnosis not present

## 2022-05-22 ENCOUNTER — Ambulatory Visit (INDEPENDENT_AMBULATORY_CARE_PROVIDER_SITE_OTHER): Payer: PPO | Admitting: Neurology

## 2022-05-22 ENCOUNTER — Encounter: Payer: Self-pay | Admitting: Neurology

## 2022-05-22 ENCOUNTER — Ambulatory Visit: Payer: PPO | Admitting: Neurology

## 2022-05-22 VITALS — BP 127/54 | HR 53 | Ht 72.0 in | Wt 188.0 lb

## 2022-05-22 DIAGNOSIS — R269 Unspecified abnormalities of gait and mobility: Secondary | ICD-10-CM

## 2022-05-22 DIAGNOSIS — R202 Paresthesia of skin: Secondary | ICD-10-CM

## 2022-05-22 DIAGNOSIS — R2689 Other abnormalities of gait and mobility: Secondary | ICD-10-CM

## 2022-05-22 NOTE — Procedures (Signed)
Full Name: Dylan Woods Gender: Male MRN #: [id number] Date of Birth: July 23, 1945    Visit Date: 05/22/2022 08:09 Age: 77 Years Examining Physician: Dr. Marcial Pacas Referring Physician: Dr. Marcial Pacas Height: 6 feet 0 inch History: 77 year old male with history of osteopenia, lumbar compression fracture, noticed gradual onset mild unsteady gait over the past few years, mild intermittent bilateral feet paresthesia, still able to walk 2 to 4 miles each day  Summary of the test test: Nerve conduction study:  Bilateral sural, superficial peroneal sensory responses were absent.  Bilateral tibial motor responses showed normal CMAP amplitude, normal conduction velocity but prolonged F-wave latency, that can be related to his tall status and cold limb temperature.  Right peroneal to EDB motor responses showed mildly decreased CMAP amplitude, mild slow conduction velocity.  Left peroneal to EDB motor response was absent, there was also atrophy of left extensor digitorum brevis muscle.  Right superficial peroneal sensory responses were absent.  Left ulnar sensory and motor responses were normal.  Left median sensory response showed borderline prolonged peak latency.  Left median motor responses showed no significant abnormalities.  Electromyography: Selected needle examinations were performed at bilateral lower extremity muscles, lumbosacral paraspinal muscles; left upper extremity muscle and left cervical paraspinal muscles.  There was no spontaneous activity at bilateral lower extremity muscles, motor unit potential morphology were within normal limit, mildly decreased recruitment patterns.  There was no spontaneous activity at bilateral lumbosacral paraspinal muscles.  Selective needle examination of left upper extremity muscles and left cervical paraspinal muscles were normal   Conclusion: This is an abnormal study.  There is electrodiagnostic evidence of mild length-dependent  axonal sensorimotor polyneuropathy.  There is no evidence of demyelinating natures.  There is also evidence of mild chronic lumbosacral radiculopathy, involving bilateral L4-5 S1 myotomes.  In addition, there is evidence of mild left carpal tunnel syndromes.    ------------------------------- Judieth Keens, [name] Ph.D.  [facility] [address] [place],  [address] Tel: [phone] Fax: [phone]  Verbal informed consent was obtained from the patient, patient was informed of potential risk of procedure, including bruising, bleeding, hematoma formation, infection, muscle weakness, muscle pain, numbness, among others.        [place]    Nerve / Sites Muscle Latency Ref. Amplitude Ref. Rel Amp Segments Distance Velocity Ref. Area    ms ms mV mV %  cm m/s m/s mVms  L Median - APB     Wrist APB 3.5 ?4.4 4.1 ?4.0 100 Wrist - APB 7   13.5     Upper arm APB 8.5  3.5  87.5 Upper arm - Wrist 27 54 ?49 10.2  L Ulnar - ADM     Wrist ADM 2.7 ?3.3 6.4 ?6.0 100 Wrist - ADM 7   23.5     B.Elbow ADM 5.1  6.3  99 B.Elbow - Wrist 13 53 ?49 25.6     A.Elbow ADM 8.6  6.6  104 A.Elbow - B.Elbow 18 51 ?49 25.6  L Peroneal - EDB     Ankle EDB 8.1 ?6.5 1.5 ?2.0 100 Ankle - EDB 9   5.7     Fib head EDB 16.1  2.0  134 Fib head - Ankle 31 39 ?44 8.1     Pop fossa EDB 19.0  1.8  89.8 Pop fossa - Fib head 10 35 ?44 7.4         Pop fossa - Ankle  R Peroneal - EDB     Ankle EDB NR ?6.5 NR ?2.0 NR Ankle - EDB 9   NR     Fib head EDB      Fib head - Ankle   ?44          Pop fossa - Ankle      L Tibial - AH     Ankle AH 6.9 ?5.8 2.1 ?4.0 100 Ankle - AH 9   7.7     Pop fossa AH 21.3  2.6  123 Pop fossa - Ankle 47.5 33 ?41 9.6  R Tibial - AH     Ankle AH 5.3 ?5.8 4.8 ?4.0 100 Ankle - AH 9   11.8     Pop fossa AH 17.8  3.6  74.4 Pop fossa - Ankle 61 49 ?41 10.8                 SNC    Nerve / Sites Rec. Site Peak Lat Ref.  Amp Ref. Segments Distance    ms ms V V  cm  L Sural -  Ankle (Calf)     Calf Ankle NR ?4.4 NR ?6 Calf - Ankle 14  R Sural - Ankle (Calf)     Calf Ankle NR ?4.4 NR ?6 Calf - Ankle 14  L Superficial peroneal - Ankle     Lat leg Ankle NR ?4.4 NR ?6 Lat leg - Ankle 14  R Superficial peroneal - Ankle     Lat leg Ankle NR ?4.4 NR ?6 Lat leg - Ankle 14  L Median - Orthodromic (Dig II, Mid palm)     Dig II Wrist 3.4 ?3.4 6 ?10 Dig II - Wrist 13  L Ulnar - Orthodromic, (Dig V, Mid palm)     Dig V Wrist 3.0 ?3.1 5 ?5 Dig V - Wrist 52                 F  Wave    Nerve F Lat Ref.   ms ms  L Ulnar - ADM 33.0 ?32.0  L Tibial - AH 74.1 ?56.0  R Tibial - AH 75.0 ?56.0           EMG Summary Table    Spontaneous MUAP Recruitment  Muscle IA Fib PSW Fasc Other Amp Dur. Poly Pattern  R. Tibialis anterior Normal None None None _______ Normal Normal Normal Reduced  R. Tibialis posterior Normal None None None _______ Normal Normal Normal Reduced  R. Peroneus longus Normal None None None _______ Normal Normal Normal Reduced  R. Gastrocnemius (Medial head) Normal None None None _______ Normal Normal Normal Reduced  R. Vastus lateralis Normal None None None _______ Normal Normal Normal Normal  L. Tibialis anterior Normal None None None _______ Normal Normal Normal Reduced  L. Tibialis posterior Normal None None None _______ Normal Normal Normal Reduced  L. Peroneus longus Normal None None None _______ Normal Normal Normal Reduced  L. Vastus lateralis Normal None None None _______ Normal Normal Normal Normal  L. Gastrocnemius (Medial head) Normal None None None _______ Normal Normal Normal Reduced  R. Lumbar paraspinals (low) Normal None None None _______ Normal Normal Normal Normal  R. Lumbar paraspinals (mid) Normal None None None _______ Normal Normal Normal Normal  L. Lumbar paraspinals (low) Normal None None None _______ Normal Normal Normal Normal  L. Lumbar paraspinals (mid) Normal None None None _______ Normal Normal Normal Normal  L. First dorsal  interosseous Normal None None None  _______ Normal Normal Normal Normal  L. Pronator teres Normal None None None _______ Normal Normal Normal Normal  L. Biceps brachii Normal None None None _______ Normal Normal Normal Normal  L. Deltoid Normal None None None _______ Normal Normal Normal Normal  L. Triceps brachii Normal None None None _______ Normal Normal Normal Normal  L. Cervical paraspinals Normal None None None _______ Normal Normal Normal Normal

## 2022-05-22 NOTE — Progress Notes (Signed)
No chief complaint on file.     ASSESSMENT AND PLAN  Dylan Woods is a 77 y.o. 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   MEDICAL HISTORY:  Dylan Woods, is a 77 year old male seen in request by Dr. Genia Harold, for evaluation of worsening paresthesia, gait abnormality, EMG nerve conduction study, his primary care physician is Dr. Virgina Jock, Jenny Reichmann  I reviewed and summarized the referring note. PMhx. Osteoporosis Prostate Cancer HTN HLD CAD  He noticed gradual onset of numbness tingling of bilateral feet since 2022, he denies significant pain, paresthesia mainly at bottom of his feet, and noticed balance issues since 2022, also noticed bladder urgency, denies incontinence  He has a history of osteoporosis, reported incidental findings of lumbar compression fracture, he remains active,  Extensive laboratory evaluation since 23, normal or negative ANCA profile, ANA, rheumatoid factor, Lyme titer, HIV, GM1 antibody, CMP, B12, protein pheresis, lipid panel, CBC, A1c 5.6,  Personally reviewed CT angiogram head and neck November 2022 for evaluation of his complaint of headache, dizziness, no significant large vessel disease  CT cervical spine showed mild degenerative changes no significant canal stenosis  UPDATE May 22 2022: He returns for electrodiagnostic study today, which showed evidence of mild length-dependent axonal sensorimotor  polyneuropathy, no evidence of demyelinating inflammatory neuropathy, there is also evidence of mild bilateral lumbosacral radiculopathy, mild left carpal tunnel syndromes We personally reviewed MRI of thoracic spine in October 2023, mild degenerative changes, no compression fracture, no significant canal foraminal narrowing  He fell in January 2024 during a balance class, has difficulty standing up on 1 foot, leading to CT scan without contrast May 01, 2022 no acute abnormality  He is able to walk 2 to 4 miles each day without much difficulty, reported" psychologically" worried about his mild balance difficulty, such as difficulty climbing up and down stairs without holding onto handrail,  PHYSICAL EXAM:     05/22/2022    9:33 AM 04/25/2022   10:41 AM 01/30/2022    9:31 AM  Vitals with BMI  Height '6\' 0"'$  '6\' 0"'$  5' 11.6"  Weight 188 lbs 188 lbs 187 lbs 8 oz  BMI 25.49 69.62 95.2  Systolic 841 324 401  Diastolic 54 71 79  Pulse 53 57 51    PHYSICAL EXAMNIATION:  Gen: NAD, conversant, well nourised, well groomed                     Cardiovascular: Regular rate rhythm, no peripheral edema, warm, nontender. Eyes: Conjunctivae clear without exudates or hemorrhage Neck: Supple, no carotid bruits. Pulmonary: Clear to auscultation bilaterally   NEUROLOGICAL EXAM:  MENTAL STATUS: Speech/cognition: Awake, alert, oriented to history taking and casual conversation CRANIAL NERVES: CN II: Visual fields are full to confrontation. Pupils are round equal and briskly reactive to light. CN III, IV, VI: extraocular movement are normal. No ptosis. CN V: Facial sensation is intact to light touch CN VII: Face is symmetric with normal eye closure  CN VIII: Hearing is normal to causal  conversation. CN IX, X: Phonation is normal. CN XI: Head turning and shoulder shrug are intact  MOTOR:  Mild bilateral hammertoe, most obvious on left foot, mild bilateral toe extension, flexion weakness, more on left  side,  REFLEXES: Reflexes are 2+ and symmetric at the biceps, triceps, knees, and absent at ankles. Plantar responses are flexor bilaterally  SENSORY: Well-preserved to light touch, pinprick, decreased vibratory sensation to knee level  COORDINATION: There is no trunk or limb dysmetria noted.  GAIT/STANCE: He can get up from seated position arm crossed, cautious, difficulty standing up on tiptoes, heels, not Romberg signs,  REVIEW OF SYSTEMS:  Full 14 system review of systems performed and notable only for as above All other review of systems were negative.   ALLERGIES: Allergies  Allergen Reactions   Ampicillin Rash    Has patient had a PCN reaction causing immediate rash, facial/tongue/throat swelling, SOB or lightheadedness with hypotension: Yes Has patient had a PCN reaction causing severe rash involving mucus membranes or skin necrosis: No Has patient had a PCN reaction that required hospitalization: No Has patient had a PCN reaction occurring within the last 10 years: No If all of the above answers are "NO", then may proceed with Cephalosporin use.    Ketorolac Other (See Comments)    "Makes him feel like he is crazy"   Betadine [Povidone Iodine] Other (See Comments)    " irritates skin, burns and stings"   Povidone-Iodine     Other reaction(s): Unknown   Soy Allergy     Upsets bladder     HOME MEDICATIONS: Current Outpatient Medications  Medication Sig Dispense Refill   acetaminophen (TYLENOL) 500 MG tablet Take 1,000 mg by mouth every 6 (six) hours as needed for moderate pain.     acetaminophen-codeine (TYLENOL #3) 300-30 MG tablet Take 1 tablet by mouth daily as needed (headaches).     alendronate (FOSAMAX) 70 MG tablet Take 1 tablet by mouth every Saturday.     ALPRAZolam (XANAX) 0.25 MG tablet Take 0.25 mg by mouth at bedtime as needed for anxiety.     Ascorbic Acid (VITAMIN C) 1000 MG tablet Take 2,000 mg by mouth daily.     aspirin EC 81 MG tablet Take 81 mg  by mouth daily. Swallow whole.     brimonidine (ALPHAGAN) 0.2 % ophthalmic solution Place 1 drop into the left eye in the morning and at bedtime.     Calcium Carbonate-Vit D-Min (CALCIUM 600+D PLUS MINERALS) 600-400 MG-UNIT TABS Take 1 tablet by mouth 2 (two) times daily.     Calcium Glycerophosphate (PRELIEF PO) Take 3-4 tablets by mouth 3 (three) times daily as needed (overactive bladder).     desmopressin (DDAVP) 0.2 MG tablet Take 0.2 mg by mouth at bedtime.     diphenhydramine-acetaminophen (TYLENOL PM) 25-500 MG TABS tablet Take 1 tablet by mouth at bedtime as needed (sleep/pain).     Ferrous Sulfate (IRON SUPPLEMENT PO) Take 2 capsules by mouth 2 (two) times daily. [facility]  gummy 10 mg x 2 tabs bid     ketoconazole (NIZORAL) 2 % cream Apply 1 application topically daily as needed for irritation.      loratadine (CLARITIN) 10 MG tablet Take 10 mg by mouth daily.     Menthol, Topical Analgesic, (BIOFREEZE EX) Apply 1 application. topically daily as needed (pain).     METAMUCIL FIBER PO Take 1 Dose by mouth 2 (two) times daily.     metoprolol succinate (TOPROL-XL) 50 MG 24 hr  tablet TAKE 1 TABLET(50 MG) BY MOUTH AT BEDTIME WITH OR IMMEDIATELY FOLLOWING A MEAL 90 tablet 2   Multiple Vitamins-Minerals (ADULT GUMMY) CHEW Chew 1 capsule by mouth daily.     OVER THE COUNTER MEDICATION Take 1.5 Scoops by mouth 2 (two) times daily. sprout living simple pea protein otc supplement powder     OVER THE COUNTER MEDICATION at bedtime. Nature Made Time Release Tablets Melatonin and L-Theanine +GBA 1.2 tab at qhs     Propylene Glycol (SYSTANE COMPLETE OP) Place 1 drop into both eyes. 5 times per day     rosuvastatin (CRESTOR) 5 MG tablet Take 5 mg by mouth daily.     sildenafil (VIAGRA) 100 MG tablet Take 0.5-1 tablets (50-100 mg total) by mouth daily as needed. 10 tablet 11   solifenacin (VESICARE) 5 MG tablet Take 5 mg by mouth daily.     sulfamethoxazole-trimethoprim (BACTRIM DS) 800-160 MG tablet Take  1 tablet by mouth 2 (two) times daily. 6 tablet 0   valACYclovir (VALTREX) 1000 MG tablet Take 1,000 mg by mouth daily as needed (for cold sores).      Vibegron (GEMTESA) 75 MG TABS Take 75 mg by mouth daily.     No current facility-administered medications for this visit.    PAST MEDICAL HISTORY: Past Medical History:  Diagnosis Date   Anemia    with prostate cancer treatment   Asthma    as a child   Bladder outlet obstruction    Bladder stones    Coronary artery disease    Degenerative arthritis    Diverticulosis    Heart murmur    years ago per pt on 12-11-2021   History of chest pain 02/2021   per pt and epic documentation -- cardiac work-up done ---   History of radiation therapy 2018   boost w/ radioactive prostate seed implants, 110Gy; then IMRT  45Gy in 25 fractions , 01-20-2017 to 02-24-2017   History of urinary retention 12/23/2016   post surgery 12-18-2016   Hyperlipidemia    on crestor   Hyperplasia of prostate with lower urinary tract symptoms (LUTS)    Intermittent palpitations    mostly at night--  cardiologist visit w/ dr Marlou Porch 08-07-2017 in epic, ekg showed PACs   Internal hemorrhoids    Migraines    neurologist-  dr [name]   Neuropathy    both feet   Osteopenia    PAC (premature atrial contraction)    Prostate cancer [facility]) urologist-  dr dahlstedt/  oncologist-  dr [name]   dx 03-29-2016-- Stage T1c, Gleason 4+5, PSA 5.06, vol 74.51cc--- Hormone therapy (lupron injection) prostate decreased to 47cc; 12-18-2016  radiactive seed implants;  completed external beam radiation 02-24-2017 PTNS injections.     PAST SURGICAL HISTORY: Past Surgical History:  Procedure Laterality Date   BLADDER STONE REMOVAL  2021   CARDIOVASCULAR STRESS TEST  06-19-2016   dr Marlou Porch   Intermediate risk nuclear study w/ no reversible ischemia with moderate size and intensity fixed inferoseptal and septal perfusion defect, consider artifact scar/  LVEF nuclear stress 48% (45-54%)   with septal hypokinesis to dyskinesis   CATARACT EXTRACTION W/ INTRAOCULAR LENS  IMPLANT, BILATERAL  2011;  2004   CIRCUMCISION  age 31   colonscopy  11/16/2021   CYSTOSCOPY N/A 12/18/2016   Procedure: CYSTOSCOPY FLEXIBLE;  Surgeon: [name], MD;  Location: [facility];  Service: Urology;  Laterality: N/A;  NO SEEDS FOUND IN BLADDER   CYSTOSCOPY N/A  02/11/2020   Procedure: CYSTOSCOPY bladder stone extraction placement of foley catheter;  Surgeon: Ceasar Mons, MD;  Location: [facility];  Service: Urology;  Laterality: N/A;   CYSTOSCOPY WITH INSERTION OF UROLIFT  03/ 2019   dr [name]  w/ anesthesia @ [facility]   CYSTOSCOPY WITH LITHOLAPAXY N/A 12/17/2021   Procedure: CYSTOSCOPY WITH LITHOLAPAXY;  Surgeon: [name], MD;  Location: [facility];  Service: Urology;  Laterality: N/A;  1 HR   CYSTOSCOPY WITH URETHRAL DILATATION N/A 12/17/2021   Procedure: CYSTOSCOPY WITH  OPTILUME TREATMENT OF URETHRAL DILATATION;  Surgeon: [name], MD;  Location: [facility];  Service: Urology;  Laterality: N/A;   GOLD SEED IMPLANT N/A 12/18/2016   Procedure: GOLD SEED IMPLANT;  Surgeon: [name], MD;  Location: [facility];  Service: Urology;  Laterality: N/A;   3 MARKERS  IMPLANTED   INGUINAL HERNIA REPAIR Right 1992   KNEE ARTHROSCOPY Left 2003;  2008   LEFT HEART CATH AND CORONARY ANGIOGRAPHY N/A 07/20/2021   Procedure: LEFT HEART CATH AND CORONARY ANGIOGRAPHY;  Surgeon: Martinique, Peter M, MD;  Location: [facility];  Service: Cardiovascular;  Laterality: N/A;   NASAL SINUS SURGERY  2015   PROSTATE BIOPSY  03-29-2016   dr Pilar Jarvis (office)   RADIOACTIVE SEED IMPLANT N/A 12/18/2016   Procedure: RADIOACTIVE SEED IMPLANT/BRACHYTHERAPY IMPLANT;  Surgeon: [name], MD;  Location: [facility];  Service: Urology;  Laterality: N/A;    62  SEEDS IMPLANTED   rt foot  metatarsal Right    SPACE OAR INSTILLATION N/A 12/18/2016   Procedure: SPACE OAR INSTILLATION;  Surgeon: [name], MD;  Location: [facility];  Service: Urology;  Laterality: N/A;   TRANSURETHRAL RESECTION OF PROSTATE N/A 10/31/2017   Procedure: TRANSURETHRAL RESECTION OF THE PROSTATE (TURP);  Surgeon: [name], MD;  Location: [place];  Service: Urology;  Laterality: N/A;   VITRECTOMY Left 09/2021   with lens implants    FAMILY HISTORY: Family History  Problem Relation Age of Onset   Cancer Mother        lymphoma   Colon polyps Mother    Bladder Cancer Mother    Parkinsonism Father    Colon polyps Father    Cancer Brother        prostate ca s/p prostatectomy   Colon cancer Neg Hx    Esophageal cancer Neg Hx    Stomach cancer Neg Hx    Rectal cancer Neg Hx     SOCIAL HISTORY: Social History   Socioeconomic History   Marital status: Widowed    Spouse name: Not on file   Number of children: 1   Years of education: college   Highest education level: Not on file  Occupational History    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   Right handed.   College education.   Caffeine one cup daily coffee.   Social Determinants of Health   Financial Resource Strain: Not on file  Food Insecurity: Not on file  Transportation Needs: Not on file  Physical Activity: Not on file  Stress: Not  on file  Social Connections: Not on file  Intimate Partner Violence: Not on file      Marcial Pacas, [name] Ph.D.  [facility] [address], [place], [place] [address] Ph: [phone] Fax: [phone]  CC:  Shon Baton, MD [place],  [place] [id number]  Shon Baton, MD

## 2022-06-14 DIAGNOSIS — H5203 Hypermetropia, bilateral: Secondary | ICD-10-CM | POA: Diagnosis not present

## 2022-06-14 DIAGNOSIS — Z961 Presence of intraocular lens: Secondary | ICD-10-CM | POA: Diagnosis not present

## 2022-06-28 ENCOUNTER — Encounter: Payer: Self-pay | Admitting: Psychiatry

## 2022-06-28 DIAGNOSIS — G629 Polyneuropathy, unspecified: Secondary | ICD-10-CM

## 2022-07-04 ENCOUNTER — Telehealth: Payer: Self-pay | Admitting: Psychiatry

## 2022-07-04 NOTE — Telephone Encounter (Signed)
I sent a referral to [facility] for a second opinion on his neuropahty

## 2022-07-04 NOTE — Telephone Encounter (Signed)
Referral for neurology fax to [facility]. Phone: [phone], Fax: [phone].

## 2022-07-30 DIAGNOSIS — R110 Nausea: Secondary | ICD-10-CM | POA: Diagnosis not present

## 2022-07-30 DIAGNOSIS — R051 Acute cough: Secondary | ICD-10-CM | POA: Diagnosis not present

## 2022-07-30 DIAGNOSIS — N4889 Other specified disorders of penis: Secondary | ICD-10-CM | POA: Diagnosis not present

## 2022-07-30 DIAGNOSIS — Z1152 Encounter for screening for COVID-19: Secondary | ICD-10-CM | POA: Diagnosis not present

## 2022-07-30 DIAGNOSIS — G43909 Migraine, unspecified, not intractable, without status migrainosus: Secondary | ICD-10-CM | POA: Diagnosis not present

## 2022-07-30 DIAGNOSIS — J069 Acute upper respiratory infection, unspecified: Secondary | ICD-10-CM | POA: Diagnosis not present

## 2022-07-30 DIAGNOSIS — Z8546 Personal history of malignant neoplasm of prostate: Secondary | ICD-10-CM | POA: Diagnosis not present

## 2022-07-30 DIAGNOSIS — N390 Urinary tract infection, site not specified: Secondary | ICD-10-CM | POA: Diagnosis not present

## 2022-07-30 DIAGNOSIS — R300 Dysuria: Secondary | ICD-10-CM | POA: Diagnosis not present

## 2022-07-30 DIAGNOSIS — E871 Hypo-osmolality and hyponatremia: Secondary | ICD-10-CM | POA: Diagnosis not present

## 2022-08-02 DIAGNOSIS — N390 Urinary tract infection, site not specified: Secondary | ICD-10-CM | POA: Diagnosis not present

## 2022-08-02 DIAGNOSIS — N4889 Other specified disorders of penis: Secondary | ICD-10-CM | POA: Diagnosis not present

## 2022-08-02 DIAGNOSIS — J069 Acute upper respiratory infection, unspecified: Secondary | ICD-10-CM | POA: Diagnosis not present

## 2022-08-02 DIAGNOSIS — R110 Nausea: Secondary | ICD-10-CM | POA: Diagnosis not present

## 2022-08-02 DIAGNOSIS — E871 Hypo-osmolality and hyponatremia: Secondary | ICD-10-CM | POA: Diagnosis not present

## 2022-08-05 ENCOUNTER — Other Ambulatory Visit (HOSPITAL_BASED_OUTPATIENT_CLINIC_OR_DEPARTMENT_OTHER): Payer: Self-pay

## 2022-08-06 ENCOUNTER — Ambulatory Visit: Payer: PPO | Attending: Cardiology | Admitting: Cardiology

## 2022-08-06 ENCOUNTER — Encounter: Payer: Self-pay | Admitting: Cardiology

## 2022-08-06 VITALS — BP 140/85 | HR 45 | Ht 72.0 in | Wt 189.4 lb

## 2022-08-06 DIAGNOSIS — I491 Atrial premature depolarization: Secondary | ICD-10-CM

## 2022-08-06 DIAGNOSIS — R002 Palpitations: Secondary | ICD-10-CM

## 2022-08-06 DIAGNOSIS — E871 Hypo-osmolality and hyponatremia: Secondary | ICD-10-CM | POA: Diagnosis not present

## 2022-08-06 NOTE — Progress Notes (Signed)
Cardiology Office Note:    Date:  08/07/2022   ID:  Dylan Woods, DOB December 04, 1945, MRN [id number]  PCP:  Creola Corn, MD  Cardiologist:  Donato Schultz, MD    Referring MD: Creola Corn, MD    History of Present Illness:    Dylan Woods is a 77 y.o. male here today for follow-up of nonobstructive coronary artery disease, PACs/paroxysmal atrial tachycardia.    Echo 06/05/21 revealed LVEF 45-50%, global hypokinesis, and aortic valve sclerosis/calcification with no stenosis. There was also trivial mitral valve regurgitation and mild aortic valve regurgitation.   He has been seen in 2018 and 2019 for palpitations.   He joined the [employer]  He lives alone. His biggest barrier to treatment is finding someone to drive him home after an anaesthesia event. In 1992, he had a R inguinal hernia under Versed and did well.   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 During that time, he would only smoke 1 to 2 cigarettes. He walks 2 to 3 miles daily and goes to the gym. His father had parkinson's but he does not believe his father had heart disease.  Had hyponatremia, HA. Na 120. 1g of salt beverage. Stopped Desmopressin (was on since 2018) for nocturia. Repeat 125.   He denies any shortness of breath, lightheadedness, headaches, syncope, orthopnea, PND, lower extremity edema or exertional symptoms.  Past Medical History:  Diagnosis Date   Anemia    with prostate cancer treatment   Asthma    as a child   Bladder outlet obstruction    Bladder stones    Coronary artery disease    Degenerative arthritis    Diverticulosis    Heart murmur    years ago per pt on 12-11-2021   History of chest pain 02/2021   per pt and epic documentation -- cardiac work-up done ---   History of radiation therapy 2018   boost w/ radioactive prostate seed implants, 110Gy; then IMRT  45Gy in 25 fractions , 01-20-2017 to 02-24-2017   History of urinary  retention 12/23/2016   post surgery 12-18-2016   Hyperlipidemia    on crestor   Hyperplasia of prostate with lower urinary tract symptoms (LUTS)    Intermittent palpitations    mostly at night--  cardiologist visit w/ dr Anne Fu 08-07-2017 in epic, ekg showed PACs   Internal hemorrhoids    Migraines    neurologist-  dr Anne Hahn   Neuropathy    both feet   Osteopenia    PAC (premature atrial contraction)    Prostate cancer urologist-  dr dahlstedt/  oncologist-  dr Kathrynn Running   dx 03-29-2016-- Stage T1c, Gleason 4+5, PSA 5.06, vol 74.51cc--- Hormone therapy (lupron injection) prostate decreased to 47cc; 12-18-2016  radiactive seed implants;  completed external beam radiation 02-24-2017 PTNS injections.     Past Surgical History:  Procedure Laterality Date   BLADDER STONE REMOVAL  2021   CARDIOVASCULAR STRESS TEST  06-19-2016   dr Anne Fu   Intermediate risk nuclear study w/ no reversible ischemia with moderate size and intensity fixed inferoseptal and septal perfusion defect, consider artifact scar/  LVEF nuclear stress 48% (45-54%)  with septal hypokinesis to dyskinesis   CATARACT EXTRACTION W/ INTRAOCULAR LENS  IMPLANT, BILATERAL  2011;  2004   CIRCUMCISION  age 67   colonscopy  11/16/2021   CYSTOSCOPY N/A 12/18/2016   Procedure: CYSTOSCOPY FLEXIBLE;  Surgeon: Hildred Laser, MD;  Location: [facility];  Service: Urology;  Laterality: N/A;  NO SEEDS FOUND IN BLADDER   CYSTOSCOPY N/A 02/11/2020   Procedure: CYSTOSCOPY bladder stone extraction placement of foley catheter;  Surgeon: Rene Paci, MD;  Location: [facility];  Service: Urology;  Laterality: N/A;   CYSTOSCOPY WITH INSERTION OF UROLIFT  03/ 2019   dr Retta Diones  w/ anesthesia @ [facility]   CYSTOSCOPY WITH LITHOLAPAXY N/A 12/17/2021   Procedure: CYSTOSCOPY WITH LITHOLAPAXY;  Surgeon: Marcine Matar, MD;  Location: [facility];  Service: Urology;  Laterality: N/A;  1 HR    CYSTOSCOPY WITH URETHRAL DILATATION N/A 12/17/2021   Procedure: CYSTOSCOPY WITH  OPTILUME TREATMENT OF URETHRAL DILATATION;  Surgeon: Marcine Matar, MD;  Location: [facility];  Service: Urology;  Laterality: N/A;   GOLD SEED IMPLANT N/A 12/18/2016   Procedure: GOLD SEED IMPLANT;  Surgeon: Hildred Laser, MD;  Location: [facility];  Service: Urology;  Laterality: N/A;   3 MARKERS  IMPLANTED   INGUINAL HERNIA REPAIR Right 1992   KNEE ARTHROSCOPY Left 2003;  2008   LEFT HEART CATH AND CORONARY ANGIOGRAPHY N/A 07/20/2021   Procedure: LEFT HEART CATH AND CORONARY ANGIOGRAPHY;  Surgeon: Swaziland, Peter M, MD;  Location: [facility] INVASIVE CV LAB;  Service: Cardiovascular;  Laterality: N/A;   NASAL SINUS SURGERY  2015   PROSTATE BIOPSY  03-29-2016   dr Sherryl Barters (office)   RADIOACTIVE SEED IMPLANT N/A 12/18/2016   Procedure: RADIOACTIVE SEED IMPLANT/BRACHYTHERAPY IMPLANT;  Surgeon: Hildred Laser, MD;  Location: [facility];  Service: Urology;  Laterality: N/A;    62  SEEDS IMPLANTED   rt foot metatarsal Right    SPACE OAR INSTILLATION N/A 12/18/2016   Procedure: SPACE OAR INSTILLATION;  Surgeon: Hildred Laser, MD;  Location: [facility];  Service: Urology;  Laterality: N/A;   TRANSURETHRAL RESECTION OF PROSTATE N/A 10/31/2017   Procedure: TRANSURETHRAL RESECTION OF THE PROSTATE (TURP);  Surgeon: Marcine Matar, MD;  Location: [facility];  Service: Urology;  Laterality: N/A;   VITRECTOMY Left 09/2021   with lens implants    Current Medications: No outpatient medications have been marked as taking for the 08/06/22 encounter (Office Visit) with Jake Bathe, MD.     Allergies:   Ampicillin, Ketorolac, Betadine [povidone iodine], Povidone-iodine, and Soy allergy   Social History   Socioeconomic History   Marital status: Widowed    Spouse name: Not on file   Number of children: 1   Years of education:  college   Highest education level: Not on file  Occupational History    Employer: [employer]    Comment: Lawyer  Tobacco Use   Smoking status: Former    Packs/day: 0.25    Years: 6.00    Additional pack years: 0.00    Total pack years: 1.50    Types: Cigarettes    Quit date: 12/10/1969    Years since quitting: 52.6   Smokeless tobacco: Never  Vaping Use   Vaping Use: Never used  Substance and Sexual Activity   Alcohol use: Not Currently   Drug use: No   Sexual activity: Not Currently  Other Topics Concern   Not on file  Social History Narrative   Patient lives at home alone widowed.   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   Right handed.   College education.   Caffeine one  cup daily coffee.   Social Determinants of Health   Financial Resource Strain: Not on file  Food Insecurity: Not on file  Transportation Needs: Not on file  Physical Activity: Not on file  Stress: Not on file  Social Connections: Not on file     Family History: The patient's family history includes Bladder Cancer in his mother; Cancer in his brother and mother; Colon polyps in his father and mother; Parkinsonism in his father. There is no history of Colon cancer, Esophageal cancer, Stomach cancer, or Rectal cancer.  ROS:   Please see the history of present illness.    (+) Tingling (L arm) (+) Loss of sensation (L arm) (+) Chest pain All other systems reviewed and negative.   EKGs/Labs/Other Studies Reviewed:    The following studies were reviewed today:  Cardiac catheterization 07/20/2021:  Dominance: Right    Echo 06/05/21 1. Global hypokinesis worse in the mid and basal inferior wall Note  patient having runs of atrial tachycardia vs afib during exam Suggest  correlation with telemetry . Left ventricular ejection fraction, by  estimation, is 45 to 50%. The left ventricle has mildly decreased function. The left ventricle demonstrates  global hypokinesis. The left ventricular internal cavity size was moderately dilated. Left ventricular diastolic parameters are indeterminate.   2. Right ventricular systolic function is normal. The right ventricular  size is normal.   3. Left atrial size was mildly dilated.   4. The mitral valve is abnormal. Trivial mitral valve regurgitation. No  evidence of mitral stenosis.   5. The aortic valve is tricuspid. Aortic valve regurgitation is mild.  Aortic valve sclerosis/calcification is present, without any evidence of  aortic stenosis.   6. The inferior vena cava is normal in size with greater than 50%  respiratory variability, suggesting right atrial pressure of 3 mmHg.   7. Agitated saline contrast bubble study was negative, with no evidence  of any interatrial shunt.   Monitor 05/31/21 Sinus rhythm average heart rate 74 bpm 2 separate episodes of ventricular tachycardia 1 was 4 beats at 167 bpm and another was 8 beats very slow at 102 bpm. Several SVT runs, atrial tachycardia with the longest 32 seconds average rate of 125 bpm noted. Occasional PACs 3.1%, atrial bigeminy were frequent at 15.1%, triplets were also frequent at 23%. Rare PVCs Overall, frequent ectopic beats, currently on diltiazem.     Patch Wear Time:  14 days and 0 hours (2023-01-20T14:15:41-0500 to 2023-02-03T14:15:45-0500)   Patient had a min HR of 44 bpm, max HR of 222 bpm, and avg HR of 74 bpm. Predominant underlying rhythm was Sinus Rhythm. 2 Ventricular Tachycardia runs occurred, the run with the fastest interval lasting 4 beats with a max rate of 167 bpm, the longest  lasting 8 beats with an avg rate of 102 bpm. 13016 Supraventricular Tachycardia runs occurred, the run with the fastest interval lasting 8.3 secs with a max rate of 222 bpm, the longest lasting 31.9 secs with an avg rate of 125 bpm. Supraventricular  Tachycardia was detected within +/- 45 seconds of symptomatic patient event(s). Isolated SVEs were  occasional (3.1%, [id number]), SVE Couplets were frequent (15.1%, [id number]), and SVE Triplets were frequent (23.3%, [id number]). Isolated VEs were rare (<1.0%, 1429),  VE Couplets were rare (<1.0%, 5), and VE Triplets were rare (<1.0%, 1).   Carotid US 03/29/21 Summary:  Right Carotid: There is no evidence of stenosis in the right ICA.   Left Carotid: Velocities in the left ICA are consistent  with a 1-39%  stenosis.   Vertebrals:  Bilateral vertebral arteries demonstrate antegrade flow.  Subclavians: Normal flow hemodynamics were seen in bilateral subclavian arteries.   CTA Head/Neck 03/14/21 IMPRESSION: 1. Negative CT head 2. Moderate supraclinoid internal carotid artery stenosis on the right and mild supraclinoid internal carotid arteries dose on the left. 3. No intracranial large vessel occlusion 4. Mild atherosclerotic disease of the carotid bifurcation without significant stenosis. Both vertebral arteries are widely patent. 5. Multiple thyroid nodules. Largest nodule on the left 23 mm. Recommend thyroid ultrasound. (Ref: J Am Coll Radiol. 2015 Feb;12(2): 143-50).  EKG:  EKG was personally reviewed 08/06/2022 sinus bradycardia 51 no other abnormalities 07/05/21: Sinus rhythm, rate 96 bpm; frequent PACs  Recent Labs: 12/17/2021: BUN 17; Creatinine, Ser 0.70; Hemoglobin 15.3; Potassium 4.4; Sodium 140   Recent Lipid Panel    Component Value Date/Time   CHOL 132 08/30/2021 0834   TRIG 75 08/30/2021 0834   HDL 46 08/30/2021 0834   CHOLHDL 2.9 08/30/2021 0834   LDLCALC 71 08/30/2021 0834    CHA2DS2-VASc Score =   [ ] .  Therefore, the patient's annual risk of stroke is   %.        Physical Exam:    VS:  BP (!) 140/85   Pulse (!) 45   Ht 6' (1.829 m)   Wt 189 lb 6.4 oz (85.9 kg)   SpO2 95%   BMI 25.69 kg/m     Wt Readings from Last 3 Encounters:  08/06/22 189 lb 6.4 oz (85.9 kg)  05/22/22 188 lb (85.3 kg)  04/25/22 188 lb (85.3 kg)     GEN: Well nourished, well developed  in no acute distress HEENT: Normal NECK: No JVD; No carotid bruits LYMPHATICS: No lymphadenopathy CARDIAC: RRR, no murmurs, rubs, gallops RESPIRATORY:  Clear to auscultation without rales, wheezing or rhonchi  ABDOMEN: Soft, non-tender, non-distended MUSCULOSKELETAL:  No edema; No deformity  SKIN: Warm and dry NEUROLOGIC:  Alert and oriented x 3 PSYCHIATRIC:  Normal affect   ASSESSMENT:    1. PAC (premature atrial contraction)   2. Palpitations   3. Hyponatremia     PLAN:     Precordial pain Prior episode of left-sided chest discomfort with previous echocardiogram showing 45 to 50% mildly reduced ejection fraction and cardiac catheterization showing minimal CAD as above, nonobstructive.  Overall reassuring.  Continuing with aggressive secondary risk factor prevention.  Doing well currently without any significant symptoms.   PAC (premature atrial contraction) Frequent PACs noted on monitor.  Episodes of atrial tachycardia noted.  Appreciate Dr. Elberta Fortis seen him on 08/06/2021.  Recommendation was to stop diltiazem 180 and started Toprol-XL 50 mg.  He was also stratified for colonoscopy as well as eye surgery.  Doing well with mild sinus bradycardia.  Willing to tolerate.  No syncope no shortness of breath  Hyponatremia Desmopressin was stopped.  1 g salt drinks  Peripheral neuropathy Getting opinion from [facility].  Difficulty going downstairs.  Has to be very careful using visual cues.        Medication Adjustments/Labs and Tests Ordered: Current medicines are reviewed at length with the patient today.  Concerns regarding medicines are outlined above.  Orders Placed This Encounter  Procedures   EKG 12-Lead   No orders of the defined types were placed in this encounter. Signed, Donato Schultz, MD  08/07/2022 6:37 AM    [facility]

## 2022-08-06 NOTE — Patient Instructions (Signed)
Medication Instructions:  Your physician recommends that you continue on your current medications as directed. Please refer to the Current Medication list given to you today.  *If you need a refill on your cardiac medications before your next appointment, please call your pharmacy*   Follow-Up: At [facility], you and your health needs are our priority.  As part of our continuing mission to provide you with exceptional heart care, we have created designated Provider Care Teams.  These Care Teams include your primary Cardiologist (physician) and Advanced Practice Providers (APPs -  Physician Assistants and Nurse Practitioners) who all work together to provide you with the care you need, when you need it.   Your next appointment:   1 year(s)  Provider:   Mark Skains, MD     

## 2022-08-09 DIAGNOSIS — N3281 Overactive bladder: Secondary | ICD-10-CM | POA: Diagnosis not present

## 2022-08-09 DIAGNOSIS — E871 Hypo-osmolality and hyponatremia: Secondary | ICD-10-CM | POA: Diagnosis not present

## 2022-08-09 DIAGNOSIS — N401 Enlarged prostate with lower urinary tract symptoms: Secondary | ICD-10-CM | POA: Diagnosis not present

## 2022-10-03 DIAGNOSIS — C61 Malignant neoplasm of prostate: Secondary | ICD-10-CM | POA: Diagnosis not present

## 2022-10-04 DIAGNOSIS — G629 Polyneuropathy, unspecified: Secondary | ICD-10-CM | POA: Diagnosis not present

## 2022-10-09 DIAGNOSIS — N401 Enlarged prostate with lower urinary tract symptoms: Secondary | ICD-10-CM | POA: Diagnosis not present

## 2022-10-09 DIAGNOSIS — N210 Calculus in bladder: Secondary | ICD-10-CM | POA: Diagnosis not present

## 2022-10-09 DIAGNOSIS — N5201 Erectile dysfunction due to arterial insufficiency: Secondary | ICD-10-CM | POA: Diagnosis not present

## 2022-10-09 DIAGNOSIS — N3946 Mixed incontinence: Secondary | ICD-10-CM | POA: Diagnosis not present

## 2022-10-25 ENCOUNTER — Other Ambulatory Visit: Payer: Self-pay | Admitting: Cardiology

## 2022-10-30 ENCOUNTER — Encounter: Payer: Self-pay | Admitting: Psychiatry

## 2022-11-10 ENCOUNTER — Other Ambulatory Visit (HOSPITAL_BASED_OUTPATIENT_CLINIC_OR_DEPARTMENT_OTHER): Payer: Self-pay

## 2023-01-17 ENCOUNTER — Other Ambulatory Visit (HOSPITAL_BASED_OUTPATIENT_CLINIC_OR_DEPARTMENT_OTHER): Payer: Self-pay

## 2023-01-23 DIAGNOSIS — Z1212 Encounter for screening for malignant neoplasm of rectum: Secondary | ICD-10-CM | POA: Diagnosis not present

## 2023-01-23 DIAGNOSIS — Z0189 Encounter for other specified special examinations: Secondary | ICD-10-CM | POA: Diagnosis not present

## 2023-01-23 DIAGNOSIS — D649 Anemia, unspecified: Secondary | ICD-10-CM | POA: Diagnosis not present

## 2023-01-23 DIAGNOSIS — E785 Hyperlipidemia, unspecified: Secondary | ICD-10-CM | POA: Diagnosis not present

## 2023-01-23 DIAGNOSIS — N401 Enlarged prostate with lower urinary tract symptoms: Secondary | ICD-10-CM | POA: Diagnosis not present

## 2023-01-23 DIAGNOSIS — E041 Nontoxic single thyroid nodule: Secondary | ICD-10-CM | POA: Diagnosis not present

## 2023-01-23 DIAGNOSIS — M810 Age-related osteoporosis without current pathological fracture: Secondary | ICD-10-CM | POA: Diagnosis not present

## 2023-01-23 DIAGNOSIS — E875 Hyperkalemia: Secondary | ICD-10-CM | POA: Diagnosis not present

## 2023-01-23 DIAGNOSIS — E611 Iron deficiency: Secondary | ICD-10-CM | POA: Diagnosis not present

## 2023-01-23 DIAGNOSIS — Z0000 Encounter for general adult medical examination without abnormal findings: Secondary | ICD-10-CM | POA: Diagnosis not present

## 2023-01-30 DIAGNOSIS — Z8546 Personal history of malignant neoplasm of prostate: Secondary | ICD-10-CM | POA: Diagnosis not present

## 2023-01-30 DIAGNOSIS — G629 Polyneuropathy, unspecified: Secondary | ICD-10-CM | POA: Diagnosis not present

## 2023-01-30 DIAGNOSIS — M810 Age-related osteoporosis without current pathological fracture: Secondary | ICD-10-CM | POA: Diagnosis not present

## 2023-01-30 DIAGNOSIS — Z0000 Encounter for general adult medical examination without abnormal findings: Secondary | ICD-10-CM | POA: Diagnosis not present

## 2023-01-30 DIAGNOSIS — N401 Enlarged prostate with lower urinary tract symptoms: Secondary | ICD-10-CM | POA: Diagnosis not present

## 2023-01-30 DIAGNOSIS — R490 Dysphonia: Secondary | ICD-10-CM | POA: Diagnosis not present

## 2023-01-30 DIAGNOSIS — W540XXA Bitten by dog, initial encounter: Secondary | ICD-10-CM | POA: Diagnosis not present

## 2023-01-30 DIAGNOSIS — R82998 Other abnormal findings in urine: Secondary | ICD-10-CM | POA: Diagnosis not present

## 2023-01-30 DIAGNOSIS — E041 Nontoxic single thyroid nodule: Secondary | ICD-10-CM | POA: Diagnosis not present

## 2023-01-30 DIAGNOSIS — R2689 Other abnormalities of gait and mobility: Secondary | ICD-10-CM | POA: Diagnosis not present

## 2023-01-30 DIAGNOSIS — R809 Proteinuria, unspecified: Secondary | ICD-10-CM | POA: Diagnosis not present

## 2023-01-30 DIAGNOSIS — S61452A Open bite of left hand, initial encounter: Secondary | ICD-10-CM | POA: Diagnosis not present

## 2023-01-30 DIAGNOSIS — I2510 Atherosclerotic heart disease of native coronary artery without angina pectoris: Secondary | ICD-10-CM | POA: Diagnosis not present

## 2023-01-30 DIAGNOSIS — N3281 Overactive bladder: Secondary | ICD-10-CM | POA: Diagnosis not present

## 2023-01-30 DIAGNOSIS — R002 Palpitations: Secondary | ICD-10-CM | POA: Diagnosis not present

## 2023-01-31 DIAGNOSIS — E559 Vitamin D deficiency, unspecified: Secondary | ICD-10-CM | POA: Diagnosis not present

## 2023-01-31 DIAGNOSIS — M810 Age-related osteoporosis without current pathological fracture: Secondary | ICD-10-CM | POA: Diagnosis not present

## 2023-02-05 DIAGNOSIS — M545 Low back pain, unspecified: Secondary | ICD-10-CM | POA: Diagnosis not present

## 2023-02-05 DIAGNOSIS — M25552 Pain in left hip: Secondary | ICD-10-CM | POA: Diagnosis not present

## 2023-02-05 DIAGNOSIS — M810 Age-related osteoporosis without current pathological fracture: Secondary | ICD-10-CM | POA: Diagnosis not present

## 2023-03-24 DIAGNOSIS — Z8546 Personal history of malignant neoplasm of prostate: Secondary | ICD-10-CM | POA: Diagnosis not present

## 2023-03-27 DIAGNOSIS — N3941 Urge incontinence: Secondary | ICD-10-CM | POA: Diagnosis not present

## 2023-03-27 DIAGNOSIS — Z8546 Personal history of malignant neoplasm of prostate: Secondary | ICD-10-CM | POA: Diagnosis not present

## 2023-04-24 ENCOUNTER — Other Ambulatory Visit (HOSPITAL_BASED_OUTPATIENT_CLINIC_OR_DEPARTMENT_OTHER): Payer: Self-pay

## 2023-04-28 DIAGNOSIS — M25552 Pain in left hip: Secondary | ICD-10-CM | POA: Diagnosis not present

## 2023-04-30 DIAGNOSIS — M25552 Pain in left hip: Secondary | ICD-10-CM | POA: Diagnosis not present

## 2023-05-09 DIAGNOSIS — R42 Dizziness and giddiness: Secondary | ICD-10-CM | POA: Diagnosis not present

## 2023-05-09 DIAGNOSIS — E871 Hypo-osmolality and hyponatremia: Secondary | ICD-10-CM | POA: Diagnosis not present

## 2023-05-09 DIAGNOSIS — H8110 Benign paroxysmal vertigo, unspecified ear: Secondary | ICD-10-CM | POA: Diagnosis not present

## 2023-05-12 DIAGNOSIS — M25552 Pain in left hip: Secondary | ICD-10-CM | POA: Diagnosis not present

## 2023-05-15 DIAGNOSIS — L565 Disseminated superficial actinic porokeratosis (DSAP): Secondary | ICD-10-CM | POA: Diagnosis not present

## 2023-05-15 DIAGNOSIS — L570 Actinic keratosis: Secondary | ICD-10-CM | POA: Diagnosis not present

## 2023-05-15 DIAGNOSIS — L718 Other rosacea: Secondary | ICD-10-CM | POA: Diagnosis not present

## 2023-05-15 DIAGNOSIS — D225 Melanocytic nevi of trunk: Secondary | ICD-10-CM | POA: Diagnosis not present

## 2023-05-15 DIAGNOSIS — L821 Other seborrheic keratosis: Secondary | ICD-10-CM | POA: Diagnosis not present

## 2023-05-15 DIAGNOSIS — Z85828 Personal history of other malignant neoplasm of skin: Secondary | ICD-10-CM | POA: Diagnosis not present

## 2023-06-09 DIAGNOSIS — M25552 Pain in left hip: Secondary | ICD-10-CM | POA: Diagnosis not present

## 2023-06-17 DIAGNOSIS — H40013 Open angle with borderline findings, low risk, bilateral: Secondary | ICD-10-CM | POA: Diagnosis not present

## 2023-06-17 DIAGNOSIS — H524 Presbyopia: Secondary | ICD-10-CM | POA: Diagnosis not present

## 2023-07-04 IMAGING — US US FNA BIOPSY THYROID 1ST LESION
1 series · 13 of 21 positions shown · non-contrast
Comparison: US Thyroid 03/28/21

MEDICATIONS:
10 cc 1% lidocaine

COMPLICATIONS:
None immediate.

INDICATION: Right inferior thyroid nodule; 2.8 cm.

Left inferior thyroid nodule; 3.0 cm.
EXAM:
ULTRASOUND GUIDED FINE NEEDLE ASPIRATION OF INDETERMINATE THYROID
NODULE
TECHNIQUE: Informed written consent was obtained from the patient after a
discussion of the risks, benefits and alternatives to treatment.
Questions regarding the procedure were encouraged and answered. A
timeout was performed prior to the initiation of the procedure.

[Series 1: us fna biopsy thyroid 1st lesion · 0.05mm/px · 21 acquisitions, 13 frames shown]
[im 1/21]
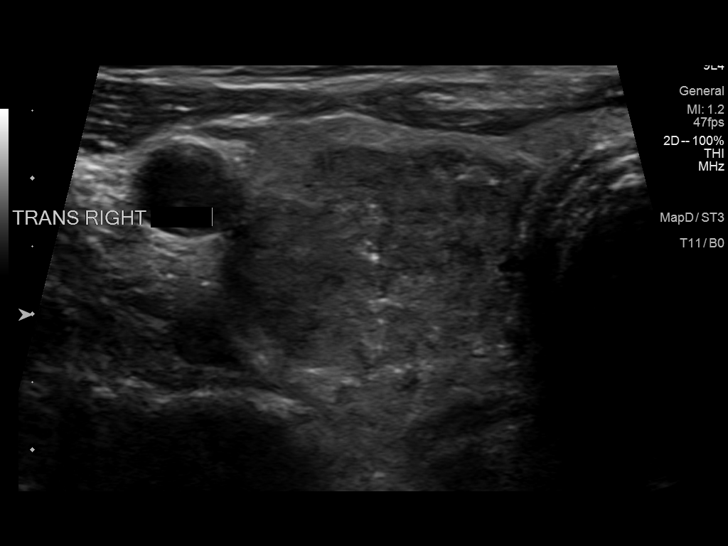
[im 3/21]
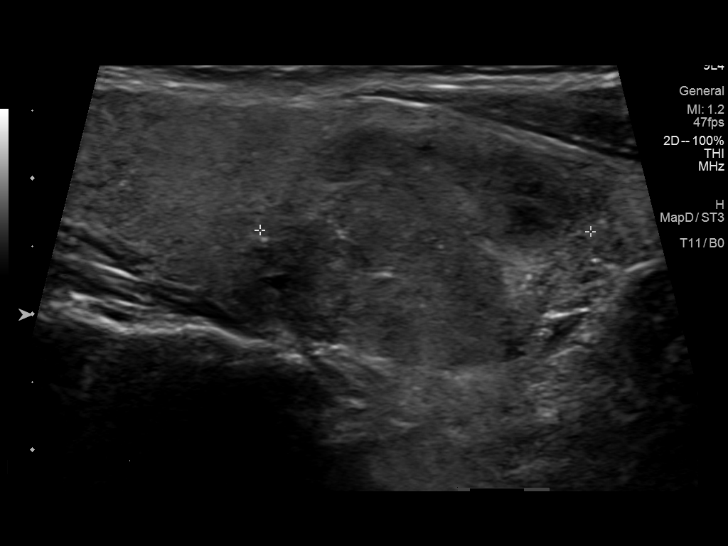
[im 5/21]
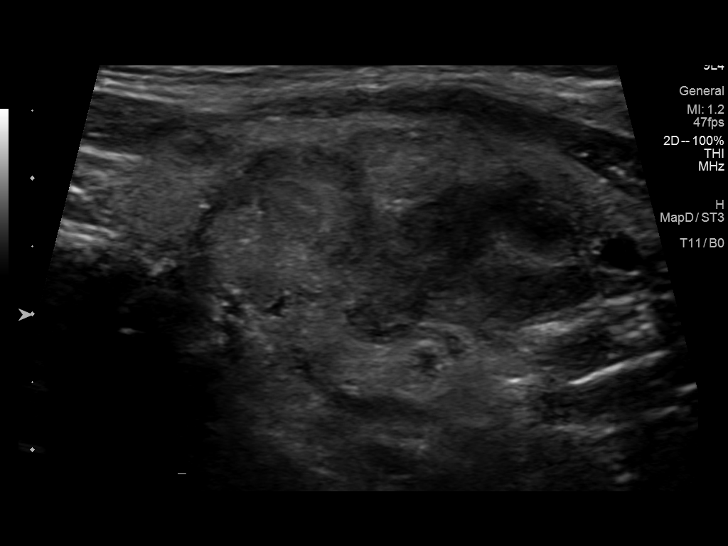
[im 6/21]
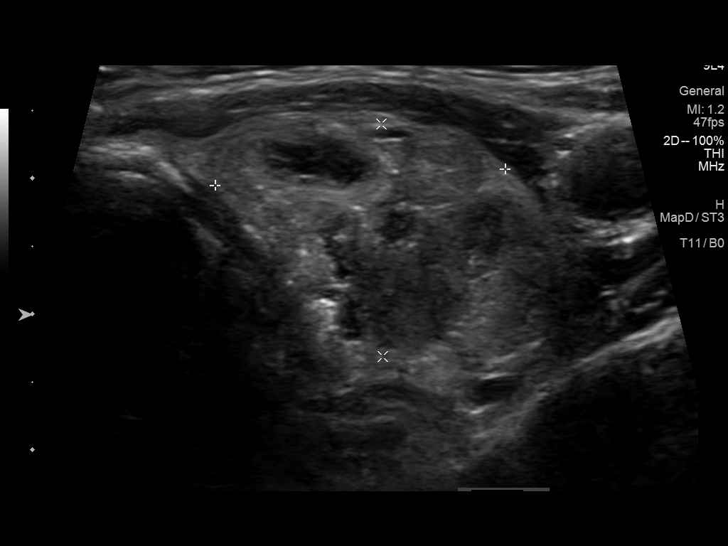
[im 8/21]
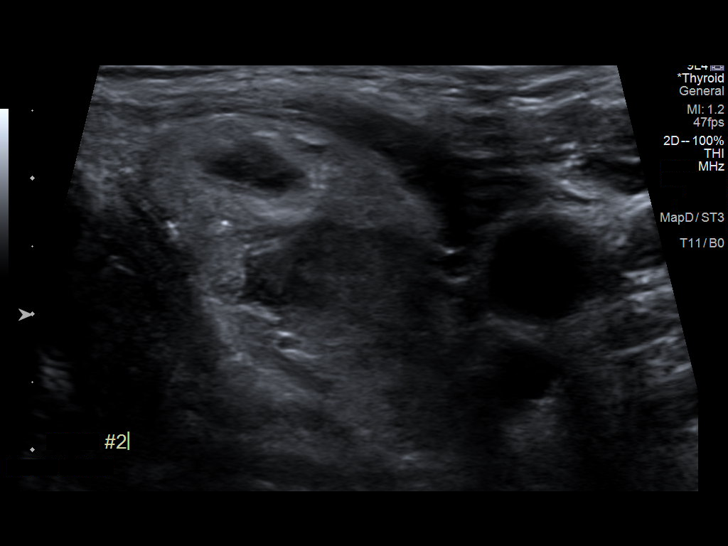
[im 9/21]
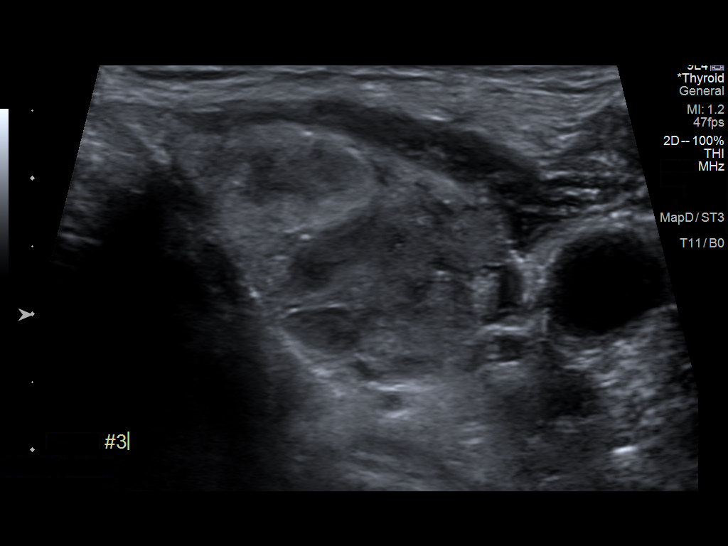
[im 11/21]
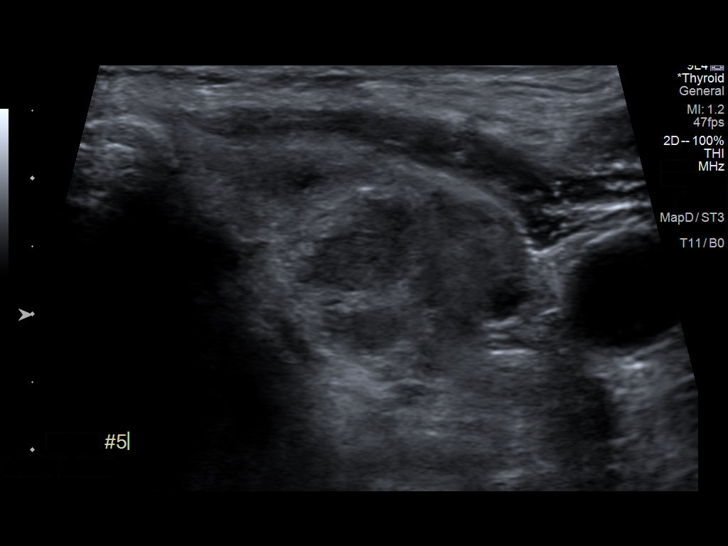
[im 13/21]
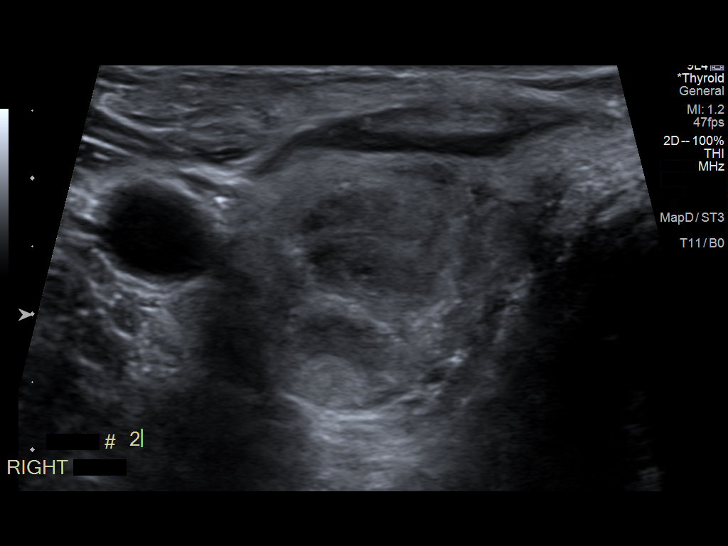
[im 14/21]
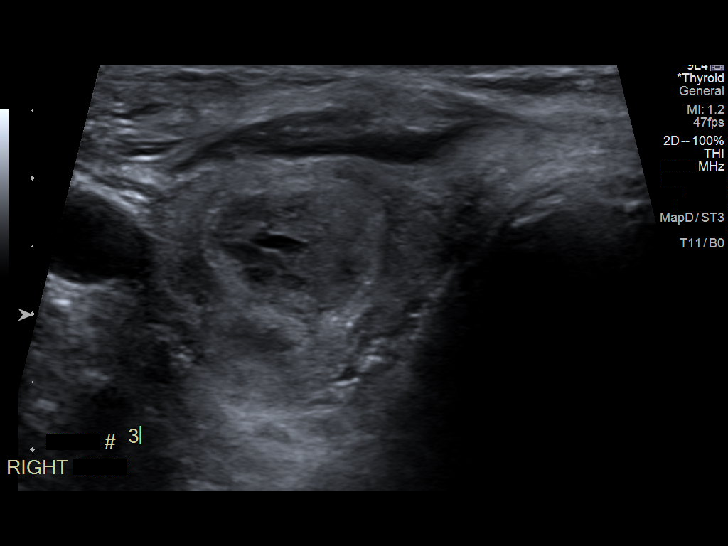
[im 16/21]
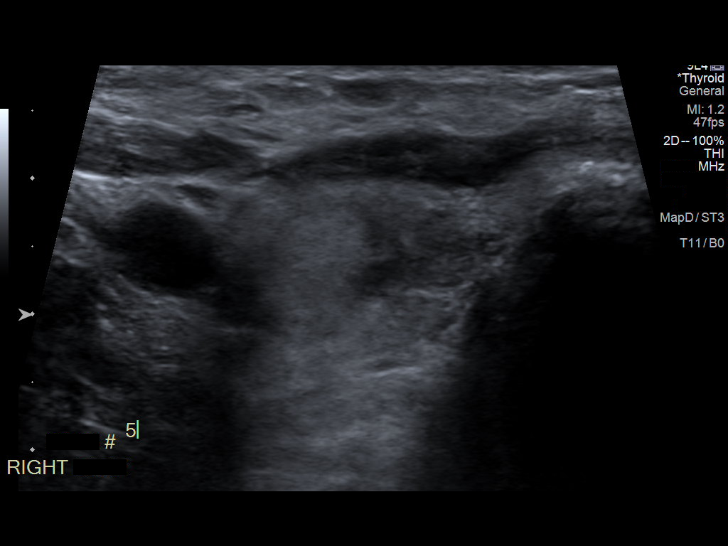
[im 17/21]
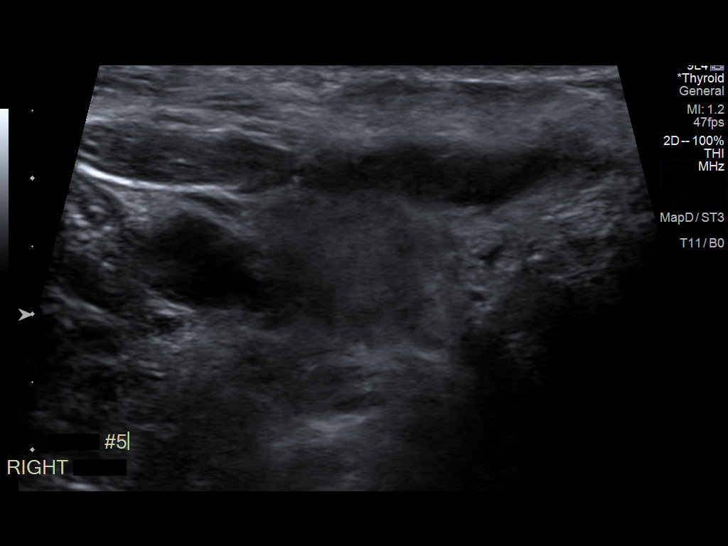
[im 19/21]
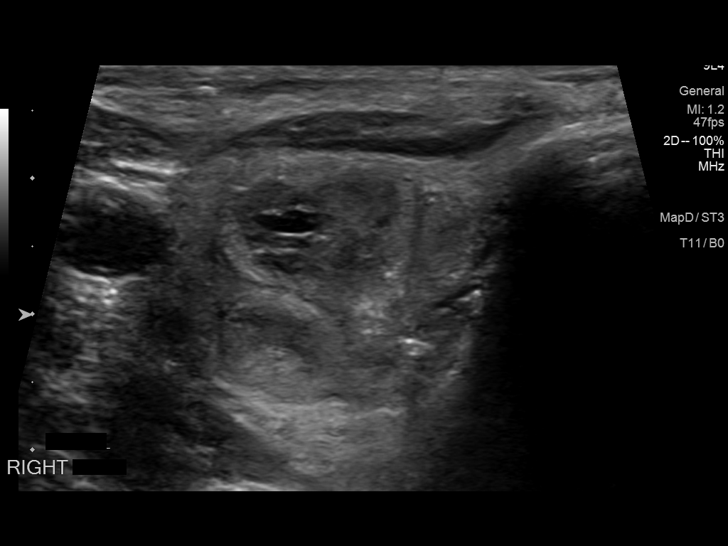
[im 21/21]
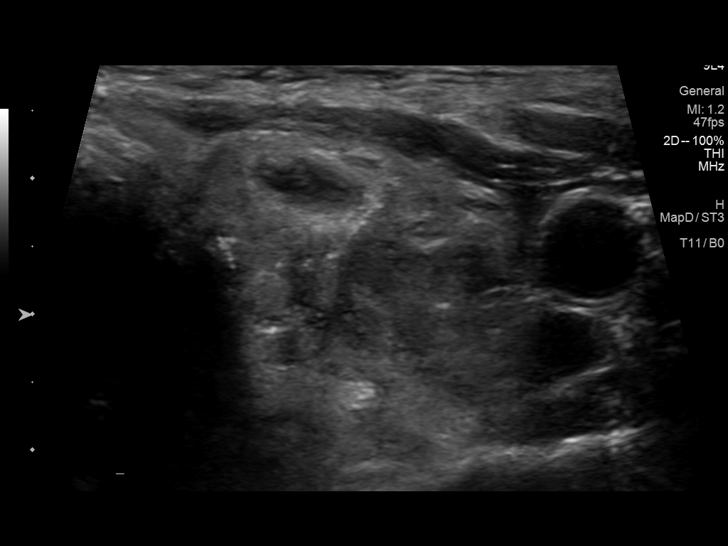

[13 of 21 positions shown; findings below may reference images not displayed]

Pre-procedural ultrasound scanning demonstrated unchanged size and
appearance of the indeterminate nodules within the right and left
thyroid lobes.

The procedure was planned. The neck was prepped in the usual sterile
fashion, and a sterile drape was applied covering the operative
field. A timeout was performed prior to the initiation of the
procedure. Local anesthesia was provided with 1% lidocaine.

Under direct ultrasound guidance, 5 FNA biopsies were performed of
the right inferior thyroid nodule with a 27 gauge needle.

2 of these samples were obtained for AFIRMA.

Multiple ultrasound images were saved for procedural documentation
purposes. The samples were prepared and submitted to pathology.

Under direct ultrasound guidance, 5 FNA biopsies were performed of
the left inferior thyroid nodule with a 27 gauge needle.

2 of these samples were obtained for AFIRMA.

Multiple ultrasound images were saved for procedural documentation
purposes. The samples were prepared and submitted to pathology.

Limited post procedural scanning was negative for hematoma or
additional complication. Dressings were placed. The patient
tolerated the above procedures procedure well without immediate
postprocedural complication.
FINDINGS: Nodule reference number based on prior diagnostic ultrasound: 1

Maximum size: 2.8 cm

Location: Right; Inferior

ACR TI-RADS risk category: TR3 (3 points)

Reason for biopsy: meets ACR TI-RADS criteria

_________________________________________________________

Nodule reference number based on prior diagnostic ultrasound: 2

Maximum size: 3.0 cm

Location: Left; Inferior

ACR TI-RADS risk category: TR3 (3 points)

Reason for biopsy: meets ACR TI-RADS criteria

Ultrasound imaging confirms appropriate placement of the needles
within the thyroid nodule.
IMPRESSION: 1. Technically successful ultrasound guided fine needle aspiration
of right inferior thyroid nodule.
2. Technically successful ultrasound guided fine needle aspiration
of left inferior thyroid nodule.

Read by

Cosita Marcia

## 2023-07-04 IMAGING — US US FNA BIOPSY THYROID 1ST LESION
1 series · 13 of 21 positions shown · non-contrast
Comparison: US Thyroid 03/28/21

MEDICATIONS:
10 cc 1% lidocaine

COMPLICATIONS:
None immediate.

INDICATION: Right inferior thyroid nodule; 2.8 cm.

Left inferior thyroid nodule; 3.0 cm.
EXAM:
ULTRASOUND GUIDED FINE NEEDLE ASPIRATION OF INDETERMINATE THYROID
NODULE
TECHNIQUE: Informed written consent was obtained from the patient after a
discussion of the risks, benefits and alternatives to treatment.
Questions regarding the procedure were encouraged and answered. A
timeout was performed prior to the initiation of the procedure.

[Series 1: us fna biopsy thyroid 1st lesion · 0.05mm/px · 21 acquisitions, 13 frames shown]
[im 1/21]
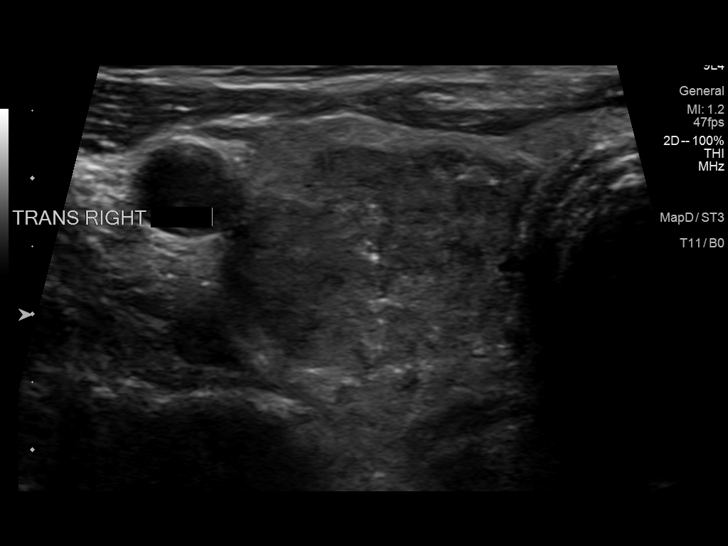
[im 3/21]
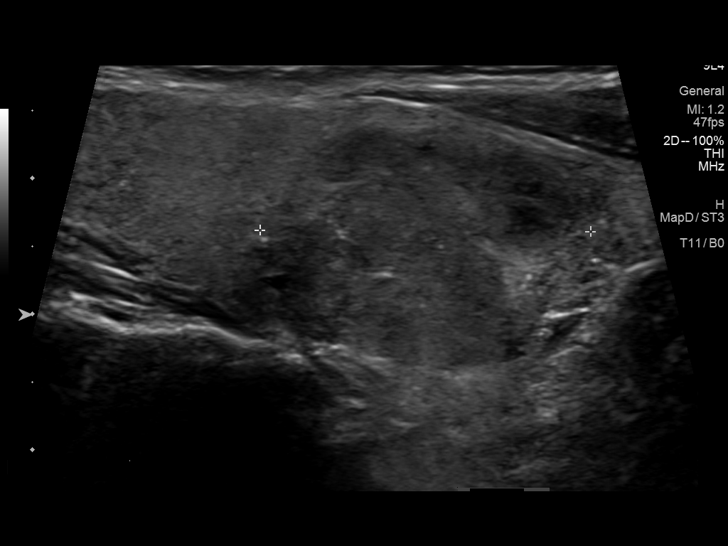
[im 5/21]
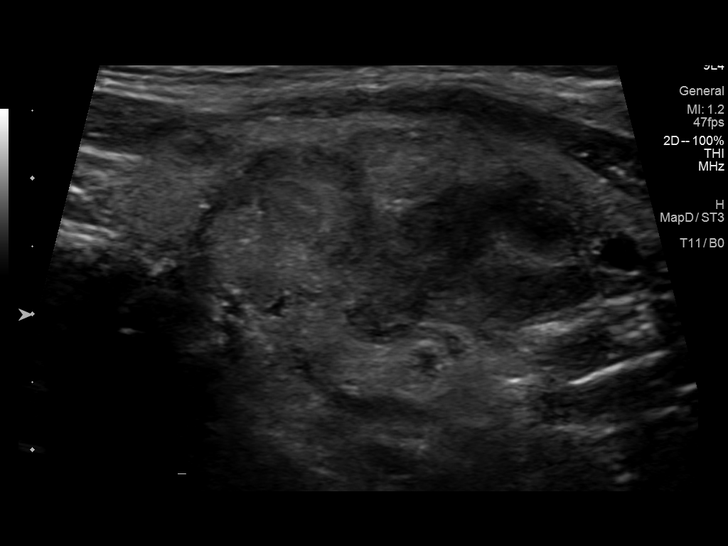
[im 6/21]
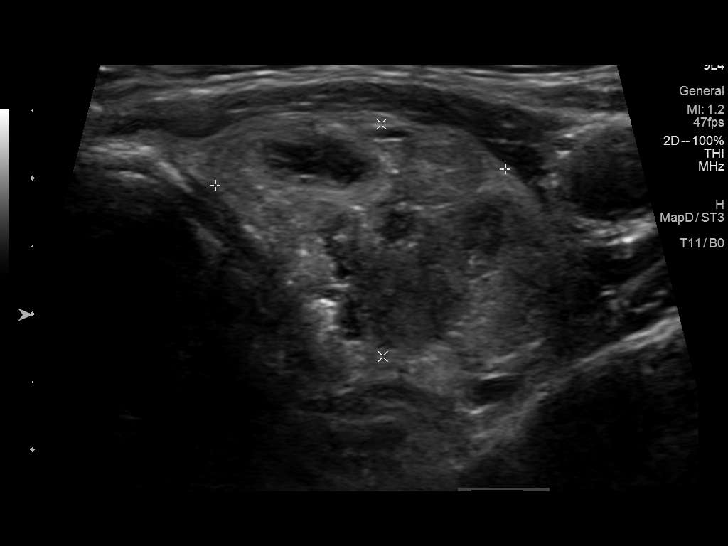
[im 8/21]
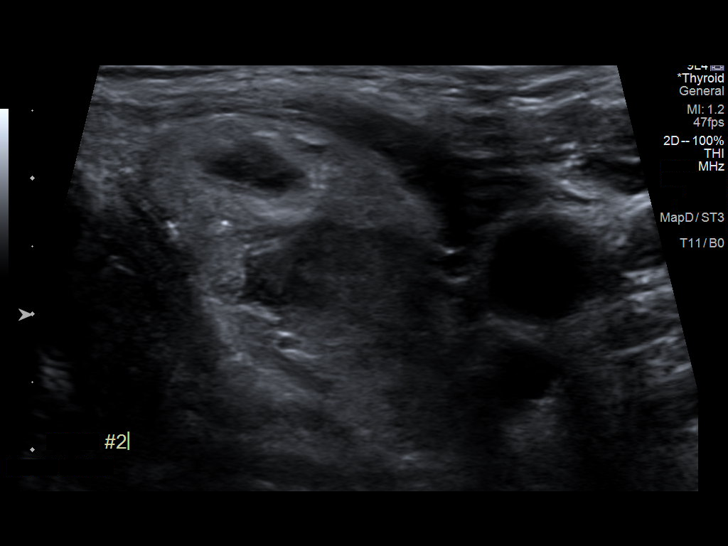
[im 9/21]
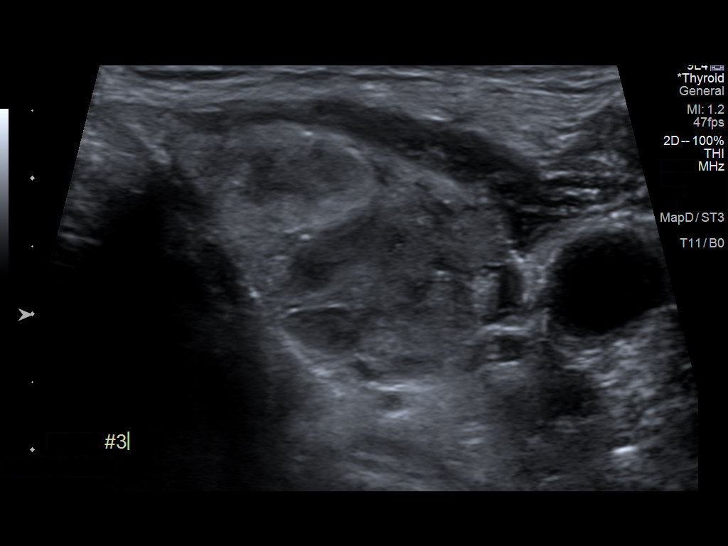
[im 11/21]
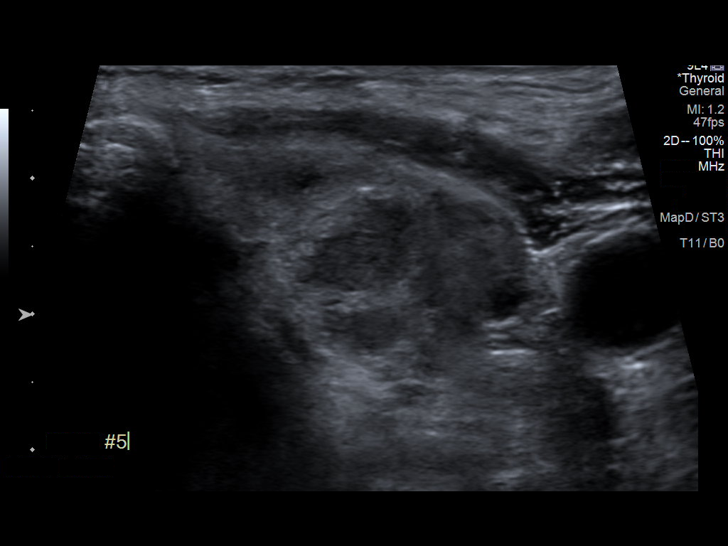
[im 13/21]
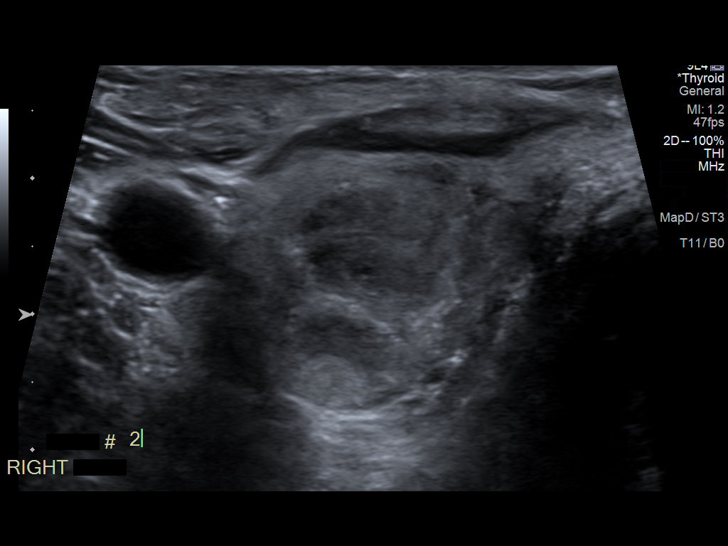
[im 14/21]
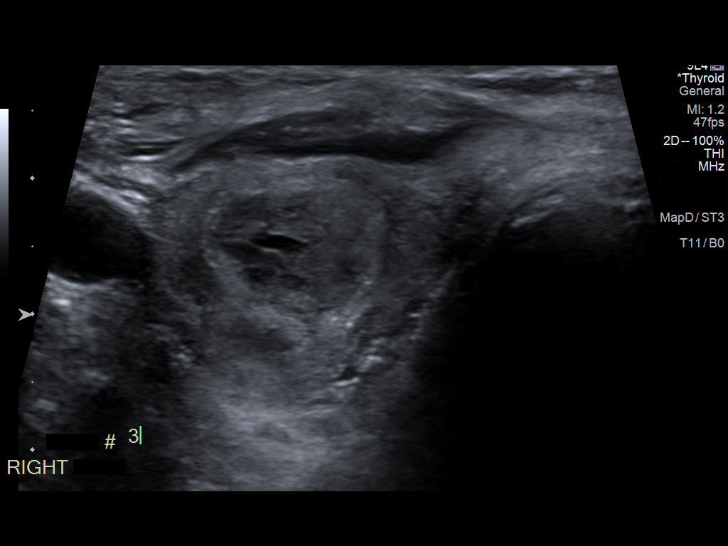
[im 16/21]
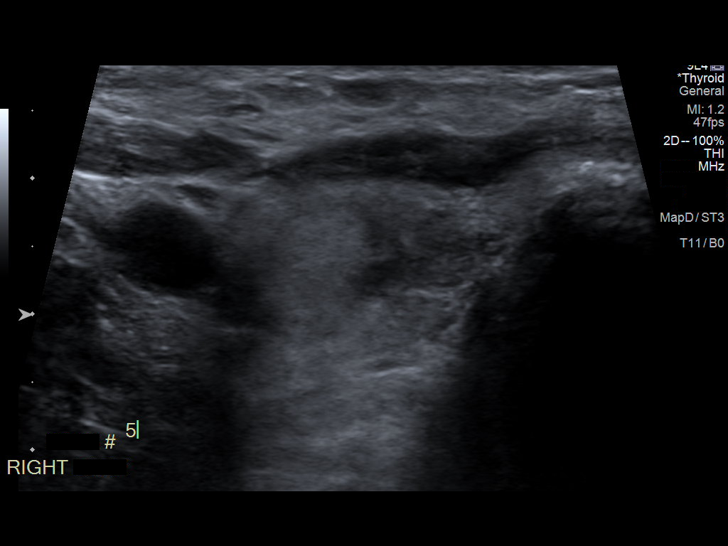
[im 17/21]
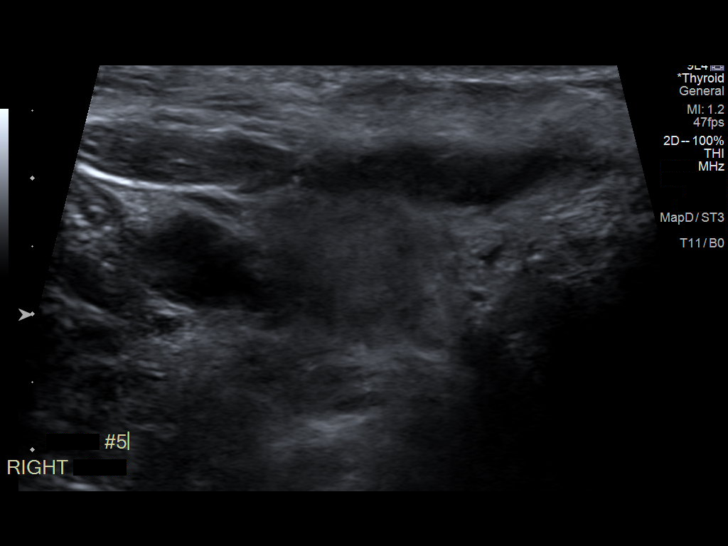
[im 19/21]
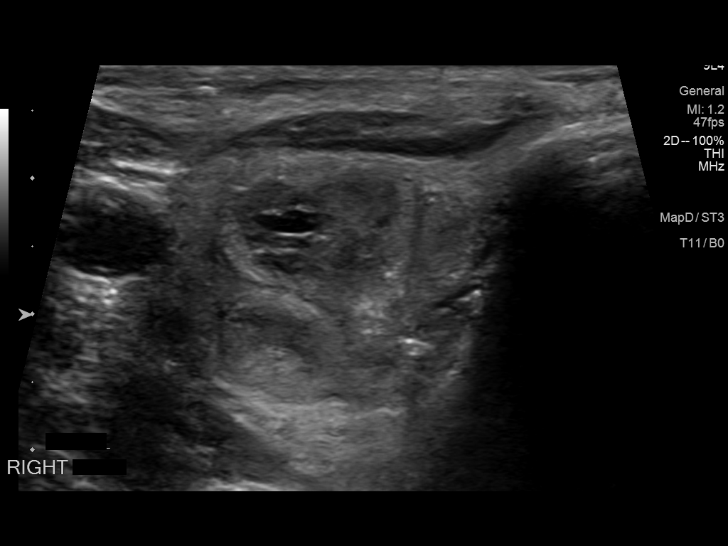
[im 21/21]
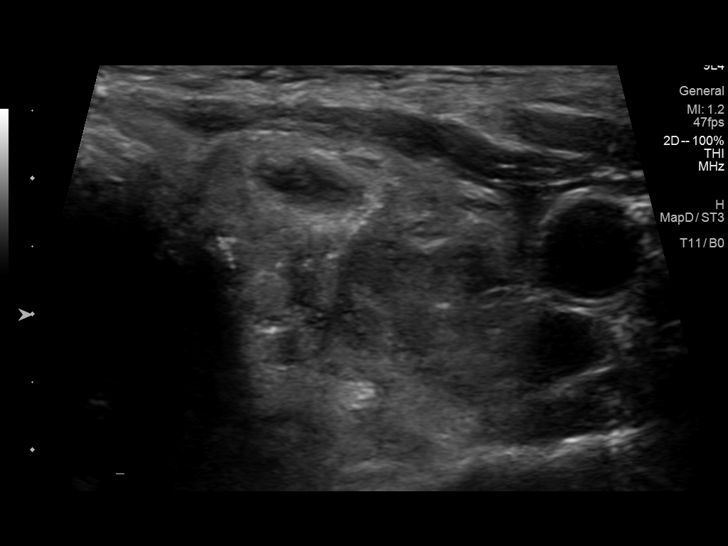

[13 of 21 positions shown; findings below may reference images not displayed]

Pre-procedural ultrasound scanning demonstrated unchanged size and
appearance of the indeterminate nodules within the right and left
thyroid lobes.

The procedure was planned. The neck was prepped in the usual sterile
fashion, and a sterile drape was applied covering the operative
field. A timeout was performed prior to the initiation of the
procedure. Local anesthesia was provided with 1% lidocaine.

Under direct ultrasound guidance, 5 FNA biopsies were performed of
the right inferior thyroid nodule with a 27 gauge needle.

2 of these samples were obtained for AFIRMA.

Multiple ultrasound images were saved for procedural documentation
purposes. The samples were prepared and submitted to pathology.

Under direct ultrasound guidance, 5 FNA biopsies were performed of
the left inferior thyroid nodule with a 27 gauge needle.

2 of these samples were obtained for AFIRMA.

Multiple ultrasound images were saved for procedural documentation
purposes. The samples were prepared and submitted to pathology.

Limited post procedural scanning was negative for hematoma or
additional complication. Dressings were placed. The patient
tolerated the above procedures procedure well without immediate
postprocedural complication.
FINDINGS: Nodule reference number based on prior diagnostic ultrasound: 1

Maximum size: 2.8 cm

Location: Right; Inferior

ACR TI-RADS risk category: TR3 (3 points)

Reason for biopsy: meets ACR TI-RADS criteria

_________________________________________________________

Nodule reference number based on prior diagnostic ultrasound: 2

Maximum size: 3.0 cm

Location: Left; Inferior

ACR TI-RADS risk category: TR3 (3 points)

Reason for biopsy: meets ACR TI-RADS criteria

Ultrasound imaging confirms appropriate placement of the needles
within the thyroid nodule.
IMPRESSION: 1. Technically successful ultrasound guided fine needle aspiration
of right inferior thyroid nodule.
2. Technically successful ultrasound guided fine needle aspiration
of left inferior thyroid nodule.

Read by

Cosita Marcia

## 2023-07-17 ENCOUNTER — Other Ambulatory Visit (HOSPITAL_BASED_OUTPATIENT_CLINIC_OR_DEPARTMENT_OTHER): Payer: Self-pay

## 2023-07-18 ENCOUNTER — Other Ambulatory Visit (HOSPITAL_BASED_OUTPATIENT_CLINIC_OR_DEPARTMENT_OTHER): Payer: Self-pay

## 2023-07-19 ENCOUNTER — Other Ambulatory Visit (HOSPITAL_BASED_OUTPATIENT_CLINIC_OR_DEPARTMENT_OTHER): Payer: Self-pay

## 2023-07-20 ENCOUNTER — Other Ambulatory Visit: Payer: Self-pay | Admitting: Cardiology

## 2023-07-21 ENCOUNTER — Other Ambulatory Visit (HOSPITAL_BASED_OUTPATIENT_CLINIC_OR_DEPARTMENT_OTHER): Payer: Self-pay

## 2023-07-21 MED ORDER — SILDENAFIL CITRATE 100 MG PO TABS
50.0000 mg | ORAL_TABLET | Freq: Every day | ORAL | 11 refills | Status: AC | PRN
  Filled 2023-07-21: qty 10, 10d supply, fill #0
  Filled 2023-08-25: qty 10, 10d supply, fill #1

## 2023-08-01 ENCOUNTER — Encounter: Payer: Self-pay | Admitting: Cardiology

## 2023-08-01 ENCOUNTER — Ambulatory Visit: Payer: PPO | Attending: Cardiology | Admitting: Cardiology

## 2023-08-01 VITALS — BP 132/80 | HR 54 | Ht 72.0 in | Wt 180.0 lb

## 2023-08-01 DIAGNOSIS — I491 Atrial premature depolarization: Secondary | ICD-10-CM

## 2023-08-01 DIAGNOSIS — I2510 Atherosclerotic heart disease of native coronary artery without angina pectoris: Secondary | ICD-10-CM

## 2023-08-01 DIAGNOSIS — E871 Hypo-osmolality and hyponatremia: Secondary | ICD-10-CM

## 2023-08-01 NOTE — Progress Notes (Signed)
 Cardiology Office Note:  .   Date:  08/01/2023  ID:  WENZEL BACKLUND, DOB [birth year], MRN [id number] PCP: Creola Corn, MD  [facility] Providers Cardiologist:  Donato Schultz, MD    History of Present Illness: .   Dylan Woods is a 78 y.o. male Discussed the use of AI scribe software for clinical note transcription with the patient, who gave verbal consent to proceed.  History of Present Illness YERIK ZERINGUE is an 78 year old male with nonobstructive coronary disease, paroxysmal atrial tachycardia, and PACs who presents for follow-up.  He generally feels well with no significant chest pain or shortness of breath. He experiences very infrequent, fleeting, and mild chest pain on the left side, associated with physical activity at the gym. A cardiac catheterization on July 20, 2021, showed moderate nonobstructive coronary disease. He continues to take aspirin 81 mg daily and rosuvastatin 5 mg daily, with an LDL of 77.  He has a history of paroxysmal atrial tachycardia and frequent premature atrial contractions (PACs). He was previously on diltiazem 180 mg, which was stopped, and he started on metoprolol XL 50 mg daily. No significant symptoms related to his arrhythmia are reported.  He experienced an episode of hyponatremia with a sodium level of 120, managed by Dr. Timothy Lasso. He now uses salt in his diet to maintain stable sodium levels, which are currently stable but on the lower side.  He lives alone and is planning to move to [place], [place], to be closer to his family. He is on a waiting list for a retirement community and is experiencing stress related to the move, which he believes has caused his blood pressure to rise slightly. He reports waking up early due to stress about the move and downsizing his belongings. He maintains an active lifestyle, going to the gym three times a week and walking daily. He has successfully kept his weight around 175 pounds.      Studies  Reviewed: Marland Kitchen   EKG Interpretation Date/Time:  Friday August 01 2023 09:41:50 EDT Ventricular Rate:  54 PR Interval:  154 QRS Duration:  86 QT Interval:  418 QTC Calculation: 396 R Axis:   78  Text Interpretation: Sinus bradycardia When compared with ECG of 18-Dec-2016 12:51, Nonspecific T wave abnormality no longer evident in Inferior leads Nonspecific T wave abnormality now evident in Lateral leads Confirmed by Donato Schultz ([id number]) on 08/01/2023 9:45:39 AM    Results LABS Na: 120 LDL: 77 Triglyceride: 76 HbA1c: 5.6 Hb: 13.9 Cr: 0.84 K: 5.2 TSH: 3.9  DIAGNOSTIC Echocardiogram: EF 45-50%, trivial mitral regurgitation, mild aortic regurgitation (2023) Cardiac catheterization: Moderate nonobstructive disease (07/20/2021) Risk Assessment/Calculations:            Physical Exam:   VS:  BP 132/80   Pulse (!) 54   Ht 6' (1.829 m)   Wt 180 lb (81.6 kg)   BMI 24.41 kg/m    Wt Readings from Last 3 Encounters:  08/01/23 180 lb (81.6 kg)  08/06/22 189 lb 6.4 oz (85.9 kg)  05/22/22 188 lb (85.3 kg)    GEN: Well nourished, well developed in no acute distress NECK: No JVD; No carotid bruits CARDIAC: RRR, no murmurs, no rubs, no gallops RESPIRATORY:  Clear to auscultation without rales, wheezing or rhonchi  ABDOMEN: Soft, non-tender, non-distended EXTREMITIES:  No edema; No deformity   ASSESSMENT AND PLAN: .    Assessment and Plan Assessment & Plan Nonobstructive coronary artery disease Moderate nonobstructive coronary artery disease confirmed  by cardiac catheterization on 07/20/2021. Experiences infrequent, fleeting, mild chest pain, likely musculoskeletal, given its description and recent normal catheterization findings. On aspirin 81 mg and rosuvastatin 5 mg, maintaining an LDL of 77. Engages in regular exercise and maintains a healthy lifestyle. - Continue aspirin 81 mg daily - Continue rosuvastatin 5 mg daily - Encourage regular exercise and healthy lifestyle  Premature  atrial contractions (PACs) Frequent PACs managed with metoprolol XL 50 mg daily, following discontinuation of diltiazem 180 mg per electrophysiology recommendations. - Continue metoprolol XL 50 mg daily  Hyponatremia Previous hyponatremia with sodium level of 120. Advised to use dietary salt, which he continues. Sodium levels remain low but stable under Dr. Ferd Hibbs monitoring. - Continue current dietary salt intake - Monitor sodium levels with Dr. Timothy Lasso  Follow-up Planning relocation to [place], [place], to be closer to family. On a waiting list for a retirement community and seeking new healthcare providers, including a cardiologist at [employer]. - Schedule follow-up in one year - Assist in transitioning care to a cardiologist in [place], [place]           Signed, Donato Schultz, MD

## 2023-08-01 NOTE — Patient Instructions (Signed)

## 2023-08-21 DIAGNOSIS — R310 Gross hematuria: Secondary | ICD-10-CM | POA: Diagnosis not present

## 2023-08-21 DIAGNOSIS — Z8546 Personal history of malignant neoplasm of prostate: Secondary | ICD-10-CM | POA: Diagnosis not present

## 2023-08-22 ENCOUNTER — Other Ambulatory Visit: Payer: Self-pay

## 2023-08-22 ENCOUNTER — Other Ambulatory Visit (HOSPITAL_BASED_OUTPATIENT_CLINIC_OR_DEPARTMENT_OTHER): Payer: Self-pay

## 2023-08-22 MED ORDER — ACYCLOVIR 200 MG PO CAPS
ORAL_CAPSULE | ORAL | 2 refills | Status: AC
  Filled 2023-08-22: qty 38, 8d supply, fill #0

## 2023-08-26 ENCOUNTER — Other Ambulatory Visit (HOSPITAL_BASED_OUTPATIENT_CLINIC_OR_DEPARTMENT_OTHER): Payer: Self-pay

## 2023-08-27 ENCOUNTER — Other Ambulatory Visit: Payer: Self-pay | Admitting: Urology

## 2023-08-27 ENCOUNTER — Telehealth: Payer: Self-pay | Admitting: Cardiology

## 2023-08-27 NOTE — Telephone Encounter (Signed)
   Patient Name: Dylan Woods  DOB: November 09, 1945 MRN: [id number]  Primary Cardiologist: Dorothye Gathers, MD  Chart reviewed as part of pre-operative protocol coverage. Patient last seen in clinic on 08/01/23, patient was doing well at that time. Given past medical history and time since last visit, based on ACC/AHA guidelines, Dylan Woods is at acceptable risk for the planned procedure without further cardiovascular testing. Per Dr. Renna Cary, "OK to proceed with surgery with low cardiac risk."  Patient may hold aspirin  for 5 days prior to procedure, please resume when safe to do so from a bleeding standpoint.   I will route this recommendation to the requesting party via Epic fax function and remove from pre-op pool.  Please call with questions.  Broox Lonigro D Shaquaya Wuellner, NP 08/27/2023, 5:44 PM

## 2023-08-27 NOTE — Telephone Encounter (Signed)
   [facility] Pre-operative Risk Assessment    Request for surgical clearance:  What type of surgery is being performed?  Cystolitholapaxy    When is this surgery scheduled?  09/02/23   What type of clearance is required (medical clearance vs. Pharmacy clearance to hold med vs. Both)?  Both   Are there any medications that need to be held prior to surgery and how long? Aspirin , 5 days prior   Practice name and name of physician performing surgery?  [facility]  Dr. Yevonne Heman   What is your office phone number? [phone] (ext#: [phone])    7.   What is your office fax number? [phone]  8.   Anesthesia type (None, local, MAC, general)?  General    Dylan Woods 08/27/2023, 4:24 PM

## 2023-09-01 ENCOUNTER — Encounter (HOSPITAL_COMMUNITY): Payer: Self-pay | Admitting: Urology

## 2023-09-01 NOTE — Progress Notes (Signed)
 Spoke w/ via phone for pre-op interview--- Dylan [name] needs dos----   BMP per anesthesia.      [name] results------Current EKG in Epic dated 08/01/23 COVID test -----patient states asymptomatic no test needed Arrive at -------0700 NPO after MN NO Solid Food.   Pre-Surgery Ensure or G2:  Med rec completed Medications to take morning of surgery -----NONE Diabetic medication -----  Cardiac clearance in Epic dated 08/27/23 by Katlyn West, NP. Instructions to hold ASA 5 days prior to surgery, pt verbalized understanding.  GLP1 agonist last dose: GLP1 instructions:  Patient instructed no nail polish to be worn day of surgery Patient instructed to bring photo id and insurance card day of surgery Patient aware to have Driver (ride ) / caregiver    for 24 hours after surgery - Friend [name] Patient Special Instructions ----- Shower with antibacterial soap. Pre-Op special Instructions -----  Patient verbalized understanding of instructions that were given at this phone interview. Patient denies chest pain, sob, fever, cough at the interview.

## 2023-09-01 NOTE — Progress Notes (Signed)
 Several attempts have been made by this RN to contact patient, several messages left with no return calls. This RN called [facility] scheduler Adell Hones left message to see if she had another contact number or had spoken with patient. Awaiting return call.

## 2023-09-02 ENCOUNTER — Ambulatory Visit (HOSPITAL_COMMUNITY)
Admission: RE | Admit: 2023-09-02 | Discharge: 2023-09-02 | Disposition: A | Discharge status: Home / Self Care | Attending: Urology | Admitting: Urology

## 2023-09-02 ENCOUNTER — Encounter (HOSPITAL_COMMUNITY)
Admission: RE | Disposition: A | Discharge status: Home / Self Care | Payer: Self-pay | Source: Home / Self Care | Attending: Urology

## 2023-09-02 ENCOUNTER — Ambulatory Visit (HOSPITAL_COMMUNITY): Payer: Self-pay | Admitting: Certified Registered Nurse Anesthetist

## 2023-09-02 ENCOUNTER — Other Ambulatory Visit: Payer: Self-pay

## 2023-09-02 ENCOUNTER — Encounter (HOSPITAL_COMMUNITY): Payer: Self-pay | Admitting: Urology

## 2023-09-02 ENCOUNTER — Ambulatory Visit (HOSPITAL_BASED_OUTPATIENT_CLINIC_OR_DEPARTMENT_OTHER): Payer: Self-pay | Admitting: Certified Registered Nurse Anesthetist

## 2023-09-02 DIAGNOSIS — Z87891 Personal history of nicotine dependence: Secondary | ICD-10-CM | POA: Diagnosis not present

## 2023-09-02 DIAGNOSIS — N211 Calculus in urethra: Secondary | ICD-10-CM | POA: Diagnosis not present

## 2023-09-02 DIAGNOSIS — M1990 Unspecified osteoarthritis, unspecified site: Secondary | ICD-10-CM | POA: Insufficient documentation

## 2023-09-02 DIAGNOSIS — Y842 Radiological procedure and radiotherapy as the cause of abnormal reaction of the patient, or of later complication, without mention of misadventure at the time of the procedure: Secondary | ICD-10-CM | POA: Insufficient documentation

## 2023-09-02 DIAGNOSIS — N3041 Irradiation cystitis with hematuria: Secondary | ICD-10-CM | POA: Diagnosis not present

## 2023-09-02 DIAGNOSIS — I2510 Atherosclerotic heart disease of native coronary artery without angina pectoris: Secondary | ICD-10-CM | POA: Insufficient documentation

## 2023-09-02 DIAGNOSIS — I10 Essential (primary) hypertension: Secondary | ICD-10-CM

## 2023-09-02 DIAGNOSIS — Z8546 Personal history of malignant neoplasm of prostate: Secondary | ICD-10-CM | POA: Diagnosis not present

## 2023-09-02 DIAGNOSIS — J45909 Unspecified asthma, uncomplicated: Secondary | ICD-10-CM

## 2023-09-02 HISTORY — PX: CYSTOSCOPY WITH LITHOLAPAXY: SHX1425

## 2023-09-02 HISTORY — PX: CYSTOSCOPY WITH FULGERATION: SHX6638

## 2023-09-02 LAB — BASIC METABOLIC PANEL WITH GFR
Anion gap: 6 (ref 5–15)
BUN: 24 mg/dL — ABNORMAL HIGH (ref 8–23)
CO2: 26 mmol/L (ref 22–32)
Calcium: 9.2 mg/dL (ref 8.9–10.3)
Chloride: 108 mmol/L (ref 98–111)
Creatinine, Ser: 0.86 mg/dL (ref 0.61–1.24)
GFR, Estimated: >60 mL/min (ref >60)
Glucose, Bld: 88 mg/dL (ref 70–99)
Potassium: 3.9 mmol/L (ref 3.5–5.1)
Sodium: 140 mmol/L (ref 135–145)

## 2023-09-02 SURGERY — CYSTOSCOPY, WITH BLADDER CALCULUS LITHOLAPAXY
Anesthesia: General | Site: Bladder

## 2023-09-02 MED ORDER — PROPOFOL 10 MG/ML IV BOLUS
INTRAVENOUS | Status: AC
  Filled 2023-09-02: qty 20

## 2023-09-02 MED ORDER — FENTANYL CITRATE (PF) 100 MCG/2ML IJ SOLN
INTRAMUSCULAR | Status: AC
Start: 2023-09-02 — End: ?
  Filled 2023-09-02: qty 2

## 2023-09-02 MED ORDER — LACTATED RINGERS IV SOLN: INTRAVENOUS | Status: DC

## 2023-09-02 MED ORDER — FENTANYL CITRATE (PF) 100 MCG/2ML IJ SOLN
25.0000 ug | INTRAMUSCULAR | Status: DC | PRN
  Administered 2023-09-02: 50 ug via INTRAVENOUS

## 2023-09-02 MED ORDER — SODIUM CHLORIDE 0.9 % IR SOLN
Status: DC | PRN
  Administered 2023-09-02: 3000 mL

## 2023-09-02 MED ORDER — OXYCODONE HCL 5 MG/5ML PO SOLN: 5.0000 mg | Freq: Once | ORAL | Status: AC | PRN

## 2023-09-02 MED ORDER — CHLORHEXIDINE GLUCONATE 0.12 % MT SOLN
OROMUCOSAL | Status: AC
  Filled 2023-09-02: qty 15

## 2023-09-02 MED ORDER — FENTANYL CITRATE (PF) 250 MCG/5ML IJ SOLN
INTRAMUSCULAR | Status: DC | PRN
  Administered 2023-09-02: 25 ug via INTRAVENOUS
  Administered 2023-09-02: 50 ug via INTRAVENOUS
  Administered 2023-09-02: 25 ug via INTRAVENOUS

## 2023-09-02 MED ORDER — ROCURONIUM BROMIDE 10 MG/ML (PF) SYRINGE
PREFILLED_SYRINGE | INTRAVENOUS | Status: AC
  Filled 2023-09-02: qty 10

## 2023-09-02 MED ORDER — PHENAZOPYRIDINE HCL 100 MG PO TABS
ORAL_TABLET | ORAL | Status: AC
  Filled 2023-09-02: qty 2

## 2023-09-02 MED ORDER — CIPROFLOXACIN IN D5W 400 MG/200ML IV SOLN
INTRAVENOUS | Status: AC
  Filled 2023-09-02: qty 200

## 2023-09-02 MED ORDER — LIDOCAINE 2% (20 MG/ML) 5 ML SYRINGE
INTRAMUSCULAR | Status: DC | PRN
  Administered 2023-09-02: 80 mg via INTRAVENOUS

## 2023-09-02 MED ORDER — OXYCODONE HCL 5 MG PO TABS
5.0000 mg | ORAL_TABLET | Freq: Once | ORAL | Status: AC | PRN
  Administered 2023-09-02: 5 mg via ORAL

## 2023-09-02 MED ORDER — PHENAZOPYRIDINE HCL 100 MG PO TABS
200.0000 mg | ORAL_TABLET | Freq: Once | ORAL | Status: AC
  Administered 2023-09-02: 200 mg via ORAL

## 2023-09-02 MED ORDER — CIPROFLOXACIN IN D5W 400 MG/200ML IV SOLN
400.0000 mg | INTRAVENOUS | Status: AC
  Administered 2023-09-02: 400 mg via INTRAVENOUS

## 2023-09-02 MED ORDER — PHENYLEPHRINE 80 MCG/ML (10ML) SYRINGE FOR IV PUSH (FOR BLOOD PRESSURE SUPPORT)
PREFILLED_SYRINGE | INTRAVENOUS | Status: AC
  Filled 2023-09-02: qty 10

## 2023-09-02 MED ORDER — OXYCODONE HCL 5 MG PO TABS
ORAL_TABLET | ORAL | Status: AC
Start: 2023-09-02 — End: ?
  Filled 2023-09-02: qty 1

## 2023-09-02 MED ORDER — ONDANSETRON HCL 4 MG/2ML IJ SOLN
INTRAMUSCULAR | Status: AC
  Filled 2023-09-02: qty 2

## 2023-09-02 MED ORDER — CHLORHEXIDINE GLUCONATE 0.12 % MT SOLN
15.0000 mL | Freq: Once | OROMUCOSAL | Status: AC
  Administered 2023-09-02: 15 mL via OROMUCOSAL

## 2023-09-02 MED ORDER — FENTANYL CITRATE (PF) 250 MCG/5ML IJ SOLN
INTRAMUSCULAR | Status: AC
  Filled 2023-09-02: qty 5

## 2023-09-02 MED ORDER — GLYCOPYRROLATE PF 0.2 MG/ML IJ SOSY
PREFILLED_SYRINGE | INTRAMUSCULAR | Status: DC | PRN
  Administered 2023-09-02: .2 mg via INTRAVENOUS

## 2023-09-02 MED ORDER — AMISULPRIDE (ANTIEMETIC) 5 MG/2ML IV SOLN: 10.0000 mg | Freq: Once | INTRAVENOUS | Status: DC | PRN

## 2023-09-02 MED ORDER — ONDANSETRON HCL 4 MG/2ML IJ SOLN
INTRAMUSCULAR | Status: DC | PRN
  Administered 2023-09-02: 4 mg via INTRAVENOUS

## 2023-09-02 MED ORDER — PHENAZOPYRIDINE HCL 200 MG PO TABS: 200.0000 mg | ORAL_TABLET | Freq: Three times a day (TID) | ORAL | 0 refills | Status: AC | PRN

## 2023-09-02 MED ORDER — PHENYLEPHRINE 80 MCG/ML (10ML) SYRINGE FOR IV PUSH (FOR BLOOD PRESSURE SUPPORT)
PREFILLED_SYRINGE | INTRAVENOUS | Status: DC | PRN
  Administered 2023-09-02: 80 ug via INTRAVENOUS

## 2023-09-02 MED ORDER — PROPOFOL 10 MG/ML IV BOLUS
INTRAVENOUS | Status: DC | PRN
  Administered 2023-09-02: 50 mg via INTRAVENOUS
  Administered 2023-09-02: 100 mg via INTRAVENOUS

## 2023-09-02 MED ORDER — ACETAMINOPHEN 500 MG PO TABS
ORAL_TABLET | ORAL | Status: AC
  Filled 2023-09-02: qty 2

## 2023-09-02 MED ORDER — LIDOCAINE 2% (20 MG/ML) 5 ML SYRINGE
INTRAMUSCULAR | Status: AC
  Filled 2023-09-02: qty 5

## 2023-09-02 MED ORDER — ACETAMINOPHEN 500 MG PO TABS
1000.0000 mg | ORAL_TABLET | Freq: Once | ORAL | Status: AC
  Administered 2023-09-02: 1000 mg via ORAL

## 2023-09-02 MED ORDER — ORAL CARE MOUTH RINSE: 15.0000 mL | Freq: Once | OROMUCOSAL | Status: AC

## 2023-09-02 MED ORDER — GLYCOPYRROLATE PF 0.2 MG/ML IJ SOSY
PREFILLED_SYRINGE | INTRAMUSCULAR | Status: AC
Start: 2023-09-02 — End: ?
  Filled 2023-09-02: qty 1

## 2023-09-02 MED ORDER — STERILE WATER FOR IRRIGATION IR SOLN
Status: DC | PRN
  Administered 2023-09-02: 3000 mL
  Administered 2023-09-02: 1000 mL

## 2023-09-02 SURGICAL SUPPLY — 18 items
BAG URO CATCHER STRL LF (MISCELLANEOUS) ×2 IMPLANT
CLOTH BEACON ORANGE TIMEOUT ST (SAFETY) ×2 IMPLANT
COVER DOME SNAP 22 D (MISCELLANEOUS) IMPLANT
GLOVE BIO SURGEON STRL SZ7.5 (GLOVE) ×2 IMPLANT
GLOVE BIOGEL PI IND STRL 6.5 (GLOVE) IMPLANT
GLOVE SURG SS PI 6.5 STRL IVOR (GLOVE) IMPLANT
GOWN STRL REUS W/ TWL LRG LVL3 (GOWN DISPOSABLE) IMPLANT
GOWN STRL REUS W/ TWL XL LVL3 (GOWN DISPOSABLE) ×2 IMPLANT
HIBICLENS CHG 4% 4OZ (MISCELLANEOUS) IMPLANT
KIT TURNOVER KIT B (KITS) IMPLANT
LASER FIB FLEXIVA PULSE ID 550 (Laser) IMPLANT
MANIFOLD NEPTUNE II (INSTRUMENTS) ×2 IMPLANT
PACK CYSTO (CUSTOM PROCEDURE TRAY) ×2 IMPLANT
SOL .9 NS 3000ML IRR UROMATIC (IV SOLUTION) IMPLANT
TUBE CONNECTING 12X1/4 (SUCTIONS) ×2 IMPLANT
TUBING UROLOGY SET (TUBING) ×2 IMPLANT
WATER STERILE IRR 1000ML UROMA (IV SOLUTION) IMPLANT
WATER STERILE IRR 3000ML UROMA (IV SOLUTION) IMPLANT

## 2023-09-02 NOTE — Anesthesia Procedure Notes (Signed)
 Procedure Name: LMA Insertion Date/Time: 09/02/2023 9:28 AM  Performed by: Alys Julian, CRNAPre-anesthesia Checklist: Patient identified, Emergency Drugs available, Suction available, Patient being monitored and Timeout performed Patient Re-evaluated:Patient Re-evaluated prior to induction Oxygen Delivery Method: Circle system utilized Preoxygenation: Pre-oxygenation with 100% oxygen Induction Type: IV induction LMA: LMA inserted LMA Size: 5.0 Number of attempts: 1 Placement Confirmation: breath sounds checked- equal and bilateral and positive ETCO2 Tube secured with: Tape Dental Injury: Teeth and Oropharynx as per pre-operative assessment

## 2023-09-02 NOTE — Progress Notes (Signed)
 Notified Dr. Sherrine Dolly of PVR results.  200 ml urine out 275-340 ml left on bladder scan  New order to insert 16 fr foley catheter received, patient to follow up on Friday morning in office.

## 2023-09-02 NOTE — Transfer of Care (Signed)
 Immediate Anesthesia Transfer of Care Note  Patient: Dylan Woods  Procedure(s) Performed: CYSTOSCOPY, WITH BLADDER CALCULUS LITHOLAPAXY (Bladder) CYSTOSCOPY, WITH BLADDER FULGURATION (Bladder)  Patient Location: PACU  Anesthesia Type:General  Level of Consciousness: awake, oriented, patient cooperative, and responds to stimulation  Airway & Oxygen Therapy: Patient Spontanous Breathing  Post-op Assessment: Report given to RN, Post -op Vital signs reviewed and stable, Patient moving all extremities X 4, and Patient able to stick tongue midline  Post vital signs: Reviewed and stable  Last Vitals:  Vitals Value Taken Time  BP 166/74   Temp 98.6   Pulse 51 09/02/23 1014  Resp 13 09/02/23 1014  SpO2 97 % 09/02/23 1014  Vitals shown include unfiled device data.  Last Pain:  Vitals:   09/02/23 0736  TempSrc: Oral         Complications: No notable events documented.

## 2023-09-02 NOTE — H&P (Signed)
 Office Visit Report     08/21/2023   --------------------------------------------------------------------------------   Dylan Woods  MRN: [id number]  DOB: 1945-09-06, 78 year old Male    PRIMARY CARE:  Angeline Barefoot, MD  PRIMARY CARE FAX:  [phone]  REFERRING:  Veryl Gottron A. Sherrine Dolly, MD  PROVIDER:  Yevonne Heman, [name]  LOCATION:  [facility] [phone]     --------------------------------------------------------------------------------   CC/HPI: LUTS/ URINARY RETENTION:   Dylan Woods had persistent urinary retention following his combination radiotherapy, despite Urolift performed on 3.25.2019. Because of this, I sent him to see Dr. Kendra Pavy at [facility] Department of urology. He has had a thorough evaluation including urodynamic studies. He was found to have persistent retention, low bladder volume, detrusor hyperactivity with uninhibited contractions. He had normal compliance but decreased capacity, 84 ml. It is hard to discern whether his bladder issue is due to radiation effect or prostatic obstruction.   He had channel TURP 7.12.2019. (pathology benign).   10.22.2021: he underwent cystoscopy, extraction of prostatic and bladder calculi. Largest bladder stone was 2 cm in size.   1.30.2023: He is on both Solifenacin and Gemtesa. With this, he is voiding well. He still takes 0.6 mg of desmopressin  at night.  Current IPSS 14, quality-of-life score 3.   PROSTATE CANCER   TRUS/Bx 12.8.2017. 6/12 cores, all on the left prostate, revealed adenocarcinoma. 2 revealed GS 3+4 /pattern, one GS 4+3, 3 revealed GS 4+5 . He had negative CT/bone scan. He underwent I-125 brachytherapy on 8.29.2018 and completed 25 external beam radiotherapy treatments on 11.5.2018. He was started on LTADT on 3.6.2018.   1.15.2020: Most recent PSA undetectable. No blood in urine or stool.  Thus far, he has had an excellent PSA response.  Urolift 3.25.2019 failed to alleviate  retention.  TURP 7.12.2019: Pathology negative.  11.11.2019: 6 month Lupron  injection given  5.20.2020: Recent PSA still undetectable. T level castrate.  2.1.2021: PSA 0.035. T level 468.  6.14.2021: PSA 0.044.  10.14.2021: PSA 0.48, T level 559  3.30.2022: PSA 0.052  9.30.2022: Recent PSA 0.053.  1. 1.30.2023: PSA 0.043, T 377   BLADDER/PROSTATIC URETHRAL CALCULI   3.30.2022: no gross hematuria or voiding discomfort.  1.30.2023: He has had no dysuria or gross hematuria. He feels like he is emptying well.   5.9.2023: Consultation with Dr. McDiarmid regarding further treatment/management of lower urinary tract symptoms. He underwent cystoscopy which revealed significant stone process within his prostatic urethra.   8.28.2023: Underwent cystolitholapaxy of bladder neck calculi and Optilume treatment of bladder neck contracture.   12.15.2023: Routine followup. No urinary frequency during AM hours. Has exertion related urgency and leakage in PM hours. Is on Gemtesa as well as solifenacin. In PM he has worsened urgency and frequency (q 15-20 mins). He has had no blood in his urine. He does have a significant libido and would like to try PDE 5 inhibitors again.    6.19.2024: Dylan Woods has been doing much better with regards to his urinating and leakage. 1 pad will last a couple of days. He typically has a good stream. No gross hematuria, no dysuria. He is on Gemtesa and Solifenacin daily. He had to stop DDAVP  due to significant hyponatremia. Now he has to wake up at least 4 times a night to urinate.   At some point in the future he will be moving to [place] to be closer to his daughter.   03/27/2023: Above history noted. The patient is here today for a routine follow-up. His  PSA remains stable at 0.038. From a urinary standpoint, he continues to take Gemtesa and Vesicare (alternating meds daily). He reports a good FOS and feels like he is emptying his bladder well. Denies any associated side  effects. Denies interval UTIs, dysuria or hematuria. He continues to have episodes of mild urge incontinence and is wearing 1 ppd, which is fine with him.   08/21/23: The patient is back today for an expedited follow-up after experiencing gross hematuria with clots that started last night. He also notes associated urgency and frequency q 2 hours. Denies dysuria or flank pain.     ALLERGIES: Ampicillin CAPS - Skin Rash Betadine - "stinging" Ketorolac  TABS Tamsulosin - Skin Rash    MEDICATIONS: [facility] Aspirin  Adult Low Dose 81 MG Tablet Chewable tablet  Gemtesa 75 MG Tablet  Metoprolol  Succinate ER 50 MG Tablet Extended Release 24 Hour  Sildenafil  Citrate 100 MG Tablet Take 0.5-1 tablets (50-100 mg total) by mouth daily as needed.  Viagra  100 MG Tablet 1/2 to 1 tablet p.o. as needed  Acetaminophen -Codeine 300-30 MG Tablet Oral PRN  Ascorbic Acid 1000 MG Tablet  Brimonidine Tartrate 0.2 % Solution  Caltrate + D3 Plus Minerals  Iron  Multivitamin  Rosuvastatin Calcium 5 MG Tablet  Solifenacin Succinate  Tylenol  Extra Strength 500 MG Tablet PRN  valACYclovir  HCl 1 GM Tablet  Xanax  0.25 MG Tablet tablet PRN     Notes: Patient cut back on desmopressin  so he can have less frequency at night.   GU PSH: Catheterize For Residual - 2018 Complex cystometrogram, w/ void pressure and urethral pressure profile studies, any technique - 2019 Complex Uroflow - 2019 Cysto Bladder Stone <2.5cm - 2021 Cystoscopy - 08/28/2021, 2018 Cystoscopy TURP - 2019 Emg surf Electrd - 2019 Inject For cystogram - 2019 Insert Bladder Cath; Complex - 2019 Intrabd voidng Press - 2019 PLACE RT DEVICE/MARKER, PROS - 2018 Prostate Needle Biopsy - 2017 TRANSPERI NEEDLE PLACE, PROS - 2018 Transperineal Plmt Biodegradable Matrl 1/Mlt Njx - 2018 UROLIFT - 2019 UROLIFT - 2019       PSH Notes: Inguinal Hernia Repair, Cataract Surgery, vitrectomy of left eye with lens replacement   NON-GU PSH: Cataract Surgery.., 2010,  2012 - 2017 Colonoscopy - 11/11/2021 Foot surgery (unspecified), Right - 2020 Inguinal Hernia Repair > 5 yrs, Right - 1992 Knee Arthroscopy/surgery, Left, 2003 - 2008 Neuroeltrd Stim Post Tibial - 2021, 2021, 2021, 2021, 2021, 2021, 2021, 2021, 2021, 2021, 2021, 2021, 2021, 2021 Surgical Pathology, Gross And Microscopic Examination For Prostate Needle - 2017 Visit Complexity (formerly GPC1X) - 03/27/2023     GU PMH: History of prostate cancer - 03/27/2023, High risk prostate cancer, status post LT ADT/EBRT/brachytherapy, excellent durable PSA response, - 10/09/2022, High risk prostate cancer, status post LT ADT and EBRT with excellent response so far. PSA near nadir level, - 04/05/2022, Excellent PSA response to LT ADT and combination radiotherapy, - 01/16/2022, Recently PSA undetectable, - 10/15/2021, Completed combination radiotherapy and long-term androgen deprivation therapy. Radiotherapy completed in November, 2018. He did have high risk prostate cancer. 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 Since he has of been managed, he has been doing very well and his lower urinary tract symptoms have improved remarkably., - 2021, - 2021 BPH w/LUTS, He is actually  voiding better, holding his urine better and has limited incontinence - 10/09/2022, He has an excellent stream. He does have bothersome urgency, frequency and urgency incontinence. He is on dual OAB medical therapy. Stable, - 04/05/2022, Significant lower urinary tract symptoms with history of BPH, status post UroLift and channel TURP, - 10/15/2021, - 04/19/2021, symptomatically he is worse with worsen frequency, urgency and nocturia, - 2022, he is emptying out quite well and has a good stream., - 2022, - 2021, - 2021, S/P channel TURP--on alfuzosin. He had recent cystolithalopaxy, - 2021, - 2021, Overall stalbe sx's. Well managed with myrbetriq , dietary control, and condom catheter. , - 2021, Worsened sx's on oxybutynin  -- he will discontinue this. , - 2021 (Improving), Improved although still frequency and nocturia, - 2020 (Improving), Symptoms are significantly improved. He still has bothersome nocturia which has minimally responded to desmopressin , which he does not tolerate very well, - 2020, Somewhat improved but nocturia is quite bothersome, - 2019 (Improving), Improving, - 2019, Continued urinary retention, with recent urodynamics confirming bladder outlet obstruction but also significant latter hyperactivity with low capacity., - 2019, - 2019, - 2019, - 2019, - 2019, - 2019, - 2019 (Worsening), Retention, catheter dependent. He is having significant pain from his catheter with bladder spasms., - 2019, - 2017, Benign prostatic hyperplasia with urinary obstruction, - 2015 ED due to arterial insufficiency, This has responded to PDE 5 inhibitors, but he gets headache from this. Does not have a relationship currently. - 10/09/2022, He would like a PDE 5 inhibitor, - 04/05/2022 (Stable), He has normal testosterone  level and fair function with PDE 5 inhibitors, - 2023, - 2022, not an issue currently, - 2022, - 2021, He did not tolerate sildenafil  well and has found that strendra is more effective and produces fewer  adverse effects. , - 2021, - 2018 Mixed incontinence, This is improving - 10/09/2022, Stable to somewhat improved, - 04/05/2022, - 10/15/2021, - 08/28/2021 Nocturia - 04/05/2022, On desmopressin , - 2023, He is worried about his sodium levels. Nocturia is worse coming off of his DDAVP ., - 2022, - 2021, On ddAVP , - 2021, - 2020 (Stable), - 2020, - 2017 Urinary Frequency, On dual overactive bladder medication, doing better - 2023, - 2021 (Stable), At times q15 minutes -- this to me sounds more like OAB. He is a good candidate for PTNS., - 2021, - 2020 (Stable), - 2019, - 2017 Urinary Urgency - 2023, (Stable), - 04/19/2021, - 2020, - 2019, - 2019 Dysuria - 2021, - 2021, - 2019 Incomplete bladder emptying - 2021, - 2021 (Stable), - 2019, - 2018 Urinary Retention (Stable) - 2021, - 2019 (Stable), - 2019 (Stable), - 2018, - 2018 Prostate Cancer - 2021, PSA stable and low. , - 2021, PSA stable and low and his tesosterone has increased back to normal (in fact, above average) levels. , - 2021 (Improving), He has completed 2 years of androgen deprivation therapy, his PSA is still undetectable. He has had success from a PSA standpoint so far. He did have significant inflammatory/obstructive response from the radiotherapy requiring ventral TURP. He is emptying out  better., - 2020, Excellent response so far to combination therapy, - 2020 (Stable), Catheterization excellent PSA control thus far. This will be his last 6 month Lupron  injection today., - 2019 (Stable), Excellent recent PSA, - 2019, Excellent PSA/testosterone  result from radiotherapy and androgen deprivation therapy, - 2019, From a PSA standpoint, Traeger has had excellent response to combination androgen deprivation therapy as well as combination radiotherapy. Last PSA just over a month ago was undetectable. However, he has had significant problems with urinary retention which continues to this day despite Urolift in March of this year., - 2019 (Stable), High  risk prostate cancer, on androgen deprivation therapy, sletus on 02/24/2017. Headiotherapy which was completed on 02/24/2017. He has had an excellent PSA response with castrate levels of testosterone ., - 2019 (Stable), - 2019 (Stable), - 2018, - 2017 Weak Urinary Stream (Stable) - 2021, (Stable), - 2019, - 2019, - 2019, - 2018, - 2018, - 2018, - 2018, - 2018, - 2018, - 2018, - 2018, - 2018, - 2018 Urinary Tract Inf, Unspec site - 2020 Urinary Hesitancy - 2019 Pelvic/perineal pain - 2019 Elevated PSA - 2019, - 2018, - 2018, - 2018, - 2018, - 2018, - 2018, - 2018, - 2017, - 2017 Encounter for Prostate Cancer screening - 2017 Unil Inguinal Hernia W/O obst or gang,non-recurrent, Inguinal hernia, left - 2015 Epididymitis, Epididymitis - 2015 Testicular pain, unspecified, Testicular pain - 2015    NON-GU PMH: Osteopenia - 2019 Encounter for general adult medical examination without abnormal findings, Encounter for preventive health examination - 2015 Personal history of other diseases of the musculoskeletal system and connective tissue, History of arthritis - 2015 Personal history of other diseases of the nervous system and sense organs, History of migraine headaches - 2015 Anxiety Asthma Osteoarthritis Osteoporosis    FAMILY HISTORY: Death - Mother, Father lymphoma - Mother malignant neoplasm of stomach - Runs In Family malignant neoplasm of urinary bladder - Runs In Family Prostate Cancer - Runs In Family   SOCIAL HISTORY: Marital Status: Widowed Preferred Language: English; Ethnicity: Not Hispanic Or Latino; Race: White Current Smoking Status: Patient does not smoke anymore. Has not smoked since 01/20/1970.  Drinks 2 drinks per day. Types of alcohol  consumed: Beer, Wine. Light Drinker.  Drinks 1 caffeinated drink per day. 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     Notes: Former smoker, Widowed, Occupation, Caffeine use, Number of children, Alcohol  use   REVIEW OF SYSTEMS:    GU  Review Male:   Patient denies frequent urination, hard to postpone urination, burning/ pain with urination, get up at night to urinate, leakage of urine, stream starts and stops, trouble starting your stream, have to strain to urinate , erection problems, and penile pain.  Gastrointestinal (Upper):   Patient denies nausea, vomiting, and indigestion/ heartburn.  Gastrointestinal (Lower):   Patient denies constipation and diarrhea.  Constitutional:   Patient denies fever, night sweats, weight loss, and fatigue.  Skin:   Patient denies skin rash/ lesion and itching.  Eyes:   Patient denies blurred vision and double vision.  Ears/ Nose/ Throat:   Patient denies sore throat and sinus problems.  Hematologic/Lymphatic:   Patient denies swollen glands and easy bruising.  Cardiovascular:   Patient denies leg swelling and chest pains.  Respiratory:   Patient denies cough and shortness of breath.  Endocrine:   Patient denies excessive thirst.  Musculoskeletal:   Patient denies back pain and joint pain.  Neurological:   Patient denies headaches and dizziness.  Psychologic:  Patient denies depression and anxiety.   VITAL SIGNS: None   GU PHYSICAL EXAMINATION:    Urethral Meatus: Normal size. No lesion, no wart, no discharge, no polyp. Normal location.  Penis: Circumcised, no warts, no cracks. No dorsal Peyronie's plaques, no left corporal Peyronie's plaques, no right corporal Peyronie's plaques, no scarring, no warts. No balanitis, no meatal stenosis.   MULTI-SYSTEM PHYSICAL EXAMINATION:    Constitutional: Well-nourished. No physical deformities. Normally developed. Good grooming.  Neurologic / Psychiatric: Oriented to time, oriented to place, oriented to person. No depression, no anxiety, no agitation.     Complexity of Data:  Records Review:   Previous Patient Records   03/24/23 10/03/22 03/28/22 10/15/21 05/21/21 01/12/21 07/12/20 02/03/20  PSA  Total PSA 0.038 ng/mL 0.043 ng/mL 0.043 ng/mL  0.047 ng/mL 0.041 ng/mL 0.053 ng/mL 0.052 ng/mL 0.048 ng/mL    03/28/22 10/15/21 05/21/21 01/12/21 02/03/20 05/20/19 09/02/18 04/29/18  Hormones  Testosterone , Total 377.4 ng/dL 960.4 ng/dL 540.9 ng/dL 811.9 ng/dL 147.8 ng/dL 295.6 ng/dL <21 ng/dL <30 ng/dL    PROCEDURES:         Flexible Cystoscopy - 52000  Risks, benefits, and some of the potential complications of the procedure were discussed at length with the patient including infection, bleeding, voiding discomfort, urinary retention, fever, chills, sepsis, and others. All questions were answered. Informed consent was obtained. Antibiotic prophylaxis was given. Sterile technique and intraurethral analgesia were used.  Meatus:  Normal size. Normal location. Normal condition.  Urethra:  No strictures.  External Sphincter:  Normal.  Verumontanum:  Normal.  Prostate:  Borderline obstructing. There is a 5 to 10 mm prostatic urethral stone that is densely adherent to the mucosa with partial obstruction of the bladder outlet.  Bladder Neck:  Non-obstructing.  Ureteral Orifices:  Normal location. Normal size. Normal shape. Effluxed clear urine.  Bladder:  No trabeculation. No tumors. Normal mucosa. No stones.      The lower urinary tract was carefully examined. The procedure was well-tolerated and without complications. Antibiotic instructions were given. Instructions were given to call the office immediately for bloody urine, difficulty urinating, urinary retention, painful or frequent urination, fever, chills, nausea, vomiting or other illness. The patient stated that he understood these instructions and would comply with them.         Visit Complexity - G2211          Urinalysis w/Scope Dipstick Dipstick Cont'd Micro  Color: Red Bilirubin: Neg mg/dL WBC/hpf: 0 - 5/hpf  Appearance: Cloudy Ketones: Neg mg/dL RBC/hpf: 20 - 86/VHQ  Specific Gravity: 1.015 Blood: 3+ ery/uL Bacteria: Rare (0-9/hpf)  pH: 7.0 Protein: 1+ mg/dL Cystals: NS  (Not Seen)  Glucose: Neg mg/dL Urobilinogen: 0.2 mg/dL Casts: NS (Not Seen)    Nitrites: Neg Trichomonas: Not Present    Leukocyte Esterase: Trace leu/uL Mucous: Not Present      Epithelial Cells: 0 - 5/hpf      Yeast: NS (Not Seen)      Sperm: Not Present    ASSESSMENT:      ICD-10 Details  1 GU:   History of prostate cancer - Z85.46 Chronic, Stable  2   Gross hematuria - R31.0 Undiagnosed New Problem   PLAN:           Schedule Return Visit/Planned Activity: Next Available Appointment - Schedule Surgery          Document Letter(s):  Created for Patient: Clinical Summary   Created for Angeline Barefoot, MD  Notes:    -Cystoscopy today revealed a densely adherent 5 to 10 mm prostatic urethral stone that is likely the source of his newfound gross hematuria. Plan for cystolitholapaxy in the next couple of weeks. Risk, benefits and alternatives discussed.

## 2023-09-02 NOTE — Anesthesia Preprocedure Evaluation (Addendum)
 Anesthesia Evaluation  Patient identified by MRN, date of birth, ID band Patient awake    Reviewed: Allergy & Precautions, NPO status , Patient's Chart, lab work & pertinent test results, reviewed documented beta blocker date and time   Airway Mallampati: III  TM Distance: >3 FB Neck ROM: Full    Dental no notable dental hx. (+) Teeth Intact, Dental Advisory Given   Pulmonary asthma , former smoker   Pulmonary exam normal breath sounds clear to auscultation       Cardiovascular + CAD  Normal cardiovascular exam Rhythm:Regular Rate:Normal  Cath 2023   1st Diag lesion is 75% stenosed.   Mid LAD lesion is 40% stenosed.   LV end diastolic pressure is mildly elevated. 1. Single vessel CAD involving a small caliber first diagonal vessel. 2. Mildly elevated LVEDP 21 mm Hg  Echocardiogram 2023 : EF 45-50%, trivial mitral regurgitation, mild aortic regurgitation    Neuro/Psych  Headaches  negative psych ROS   GI/Hepatic negative GI ROS, Neg liver ROS,,,  Endo/Other  negative endocrine ROS    Renal/GU negative Renal ROS  negative genitourinary   Musculoskeletal  (+) Arthritis ,    Abdominal   Peds  Hematology negative hematology ROS (+)   Anesthesia Other Findings   Reproductive/Obstetrics                             Anesthesia Physical Anesthesia Plan  ASA: 3  Anesthesia Plan: General   Post-op Pain Management: Tylenol  PO (pre-op)*   Induction: Intravenous  PONV Risk Score and Plan: 2 and Ondansetron , Dexamethasone  and Treatment may vary due to age or medical condition  Airway Management Planned: LMA  Additional Equipment:   Intra-op Plan:   Post-operative Plan: Extubation in OR  Informed Consent: I have reviewed the patients History and Physical, chart, labs and discussed the procedure including the risks, benefits and alternatives for the proposed anesthesia with the patient  or authorized representative who has indicated his/her understanding and acceptance.     Dental advisory given  Plan Discussed with: CRNA  Anesthesia Plan Comments:        Anesthesia Quick Evaluation

## 2023-09-02 NOTE — Discharge Instructions (Signed)
         No acetaminophen/Tylenol until after 2:00 pm today if needed.   Post Anesthesia Home Care Instructions  Activity: Get plenty of rest for the remainder of the day. A responsible individual must stay with you for 24 hours following the procedure.  For the next 24 hours, DO NOT: -Drive a car -Operate machinery -Drink alcoholic beverages -Take any medication unless instructed by your physician -Make any legal decisions or sign important papers.  Meals: Start with liquid foods such as gelatin or soup. Progress to regular foods as tolerated. Avoid greasy, spicy, heavy foods. If nausea and/or vomiting occur, drink only clear liquids until the nausea and/or vomiting subsides. Call your physician if vomiting continues.  Special Instructions/Symptoms: Your throat may feel dry or sore from the anesthesia or the breathing tube placed in your throat during surgery. If this causes discomfort, gargle with warm salt water. The discomfort should disappear within 24 hours.   

## 2023-09-02 NOTE — Op Note (Signed)
 Operative Note  Preoperative diagnosis:  1.  Recurrent 2 cm prostatic urethral stones 2.  Gross hematuria 3.  Radiation cystitis  Postoperative diagnosis: 1.  Recurrent 2 cm prostatic urethral stones 2.  Gross hematuria 3.  Radiation cystitis  Procedure(s): 1.  Cystoscopy with cystolitholapaxy of 2 cm prostatic urethral stones 2.  Bladder fulguration  Surgeon: Yevonne Heman, MD  Assistants:  None  Anesthesia:  General  Complications:  None  EBL: 10 mL  Specimens: 1.  Prostatic urethral stone fragments  Drains/Catheters: 1.  None  Intraoperative findings:   2 cm prostatic urethral stone densely adherent to the mucosa of the right lobe of the prostate Extensive glomerulations seen throughout the bladder No other urethral abnormalities  Indication:  Dylan Woods is a 78 y.o. male with a history of prostate cancer treated with radiation in 2018.  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  He had an an episode of gross hematuria on 08/20/2023, which prompted a cystoscopy in the office that revealed recurrence of his prostatic urethral stones as the likely source of his hematuria.  He has been consented today for the above procedures, voiced understanding and wished to proceed.  Description of procedure:  After informed consent was obtained, the patient was brought to the operating room and general LMA anesthesia was administered. The patient was then placed in the dorsolithotomy position and prepped and draped in the usual sterile fashion. A timeout was performed. A 23 French rigid cystoscope was then inserted into the urethral meatus and advanced into the urethra where a densely adherent 2 cm stone was seen involving the right prostatic lobe.  A complete bladder survey was performed that revealed extensive glomerulations throughout the bladder mucosa.  A 550 m holmium laser was then used to fracture the  prostatic urethral stones into numerous smaller pieces that were then evacuated from the lumen of the bladder through the sheath of the cystoscope.  Following cystolitholapaxy, there were 2 areas of bleeding involving the posterior bladder wall that were subsequently fulgurated until hemostasis was achieved.  The bladder was then drained.  He tolerated the procedure well and was transferred to the postanesthesia in stable condition.  Plan: Discharge home

## 2023-09-02 NOTE — Anesthesia Postprocedure Evaluation (Signed)
 Anesthesia Post Note  Patient: Buford Carnes  Procedure(s) Performed: CYSTOSCOPY, WITH BLADDER CALCULUS LITHOLAPAXY (Bladder) CYSTOSCOPY, WITH BLADDER FULGURATION (Bladder)     Patient location during evaluation: PACU Anesthesia Type: General Level of consciousness: awake and alert Pain management: pain level controlled Vital Signs Assessment: post-procedure vital signs reviewed and stable Respiratory status: spontaneous breathing, nonlabored ventilation and respiratory function stable Cardiovascular status: blood pressure returned to baseline Postop Assessment: no apparent nausea or vomiting Anesthetic complications: no   No notable events documented.          Rayfield Cairo

## 2023-09-03 ENCOUNTER — Encounter (HOSPITAL_COMMUNITY): Payer: Self-pay | Admitting: Urology

## 2023-09-05 DIAGNOSIS — N401 Enlarged prostate with lower urinary tract symptoms: Secondary | ICD-10-CM | POA: Diagnosis not present

## 2023-09-05 DIAGNOSIS — R3914 Feeling of incomplete bladder emptying: Secondary | ICD-10-CM | POA: Diagnosis not present

## 2023-09-10 DIAGNOSIS — N210 Calculus in bladder: Secondary | ICD-10-CM | POA: Diagnosis not present

## 2023-09-10 DIAGNOSIS — R3914 Feeling of incomplete bladder emptying: Secondary | ICD-10-CM | POA: Diagnosis not present

## 2023-09-10 DIAGNOSIS — Z8546 Personal history of malignant neoplasm of prostate: Secondary | ICD-10-CM | POA: Diagnosis not present

## 2023-09-10 DIAGNOSIS — N401 Enlarged prostate with lower urinary tract symptoms: Secondary | ICD-10-CM | POA: Diagnosis not present

## 2023-10-10 DIAGNOSIS — R3914 Feeling of incomplete bladder emptying: Secondary | ICD-10-CM | POA: Diagnosis not present

## 2023-10-10 DIAGNOSIS — N3946 Mixed incontinence: Secondary | ICD-10-CM | POA: Diagnosis not present

## 2023-10-10 DIAGNOSIS — N401 Enlarged prostate with lower urinary tract symptoms: Secondary | ICD-10-CM | POA: Diagnosis not present

## 2023-10-20 DIAGNOSIS — R058 Other specified cough: Secondary | ICD-10-CM | POA: Diagnosis not present

## 2023-10-20 DIAGNOSIS — J069 Acute upper respiratory infection, unspecified: Secondary | ICD-10-CM | POA: Diagnosis not present

## 2023-11-16 DIAGNOSIS — S61213A Laceration without foreign body of left middle finger without damage to nail, initial encounter: Secondary | ICD-10-CM | POA: Diagnosis not present

## 2023-11-16 DIAGNOSIS — T148XXA Other injury of unspecified body region, initial encounter: Secondary | ICD-10-CM | POA: Diagnosis not present

## 2023-11-16 DIAGNOSIS — Z23 Encounter for immunization: Secondary | ICD-10-CM | POA: Diagnosis not present

## 2023-12-25 ENCOUNTER — Other Ambulatory Visit (HOSPITAL_BASED_OUTPATIENT_CLINIC_OR_DEPARTMENT_OTHER): Payer: Self-pay

## 2023-12-25 MED ORDER — FLUZONE HIGH-DOSE 0.5 ML IM SUSY
0.5000 mL | PREFILLED_SYRINGE | Freq: Once | INTRAMUSCULAR | 0 refills | Status: AC
  Filled 2023-12-25: qty 0.5, 1d supply, fill #0

## 2024-01-09 ENCOUNTER — Other Ambulatory Visit (HOSPITAL_BASED_OUTPATIENT_CLINIC_OR_DEPARTMENT_OTHER): Payer: Self-pay

## 2024-01-09 MED ORDER — COMIRNATY 30 MCG/0.3ML IM SUSY
0.3000 mL | PREFILLED_SYRINGE | Freq: Once | INTRAMUSCULAR | 0 refills | Status: AC
  Filled 2024-01-09: qty 0.3, 1d supply, fill #0

## 2024-02-05 DIAGNOSIS — Z0189 Encounter for other specified special examinations: Secondary | ICD-10-CM | POA: Diagnosis not present

## 2024-02-05 DIAGNOSIS — E041 Nontoxic single thyroid nodule: Secondary | ICD-10-CM | POA: Diagnosis not present

## 2024-02-05 DIAGNOSIS — E785 Hyperlipidemia, unspecified: Secondary | ICD-10-CM | POA: Diagnosis not present

## 2024-02-05 DIAGNOSIS — I2510 Atherosclerotic heart disease of native coronary artery without angina pectoris: Secondary | ICD-10-CM | POA: Diagnosis not present

## 2024-02-05 DIAGNOSIS — D649 Anemia, unspecified: Secondary | ICD-10-CM | POA: Diagnosis not present

## 2024-02-05 DIAGNOSIS — M810 Age-related osteoporosis without current pathological fracture: Secondary | ICD-10-CM | POA: Diagnosis not present

## 2024-02-12 DIAGNOSIS — E041 Nontoxic single thyroid nodule: Secondary | ICD-10-CM | POA: Diagnosis not present

## 2024-02-12 DIAGNOSIS — Z0000 Encounter for general adult medical examination without abnormal findings: Secondary | ICD-10-CM | POA: Diagnosis not present

## 2024-02-12 DIAGNOSIS — R2689 Other abnormalities of gait and mobility: Secondary | ICD-10-CM | POA: Diagnosis not present

## 2024-02-12 DIAGNOSIS — R490 Dysphonia: Secondary | ICD-10-CM | POA: Diagnosis not present

## 2024-02-12 DIAGNOSIS — M810 Age-related osteoporosis without current pathological fracture: Secondary | ICD-10-CM | POA: Diagnosis not present

## 2024-02-12 DIAGNOSIS — G44209 Tension-type headache, unspecified, not intractable: Secondary | ICD-10-CM | POA: Diagnosis not present

## 2024-02-12 DIAGNOSIS — Z1331 Encounter for screening for depression: Secondary | ICD-10-CM | POA: Diagnosis not present

## 2024-02-12 DIAGNOSIS — M545 Low back pain, unspecified: Secondary | ICD-10-CM | POA: Diagnosis not present

## 2024-02-12 DIAGNOSIS — I6523 Occlusion and stenosis of bilateral carotid arteries: Secondary | ICD-10-CM | POA: Diagnosis not present

## 2024-02-12 DIAGNOSIS — G629 Polyneuropathy, unspecified: Secondary | ICD-10-CM | POA: Diagnosis not present

## 2024-02-12 DIAGNOSIS — E785 Hyperlipidemia, unspecified: Secondary | ICD-10-CM | POA: Diagnosis not present

## 2024-02-12 DIAGNOSIS — R82998 Other abnormal findings in urine: Secondary | ICD-10-CM | POA: Diagnosis not present

## 2024-02-12 DIAGNOSIS — Z1389 Encounter for screening for other disorder: Secondary | ICD-10-CM | POA: Diagnosis not present

## 2024-02-12 DIAGNOSIS — R809 Proteinuria, unspecified: Secondary | ICD-10-CM | POA: Diagnosis not present

## 2024-02-12 DIAGNOSIS — R002 Palpitations: Secondary | ICD-10-CM | POA: Diagnosis not present

## 2024-04-01 DIAGNOSIS — R3915 Urgency of urination: Secondary | ICD-10-CM | POA: Diagnosis not present

## 2024-04-01 DIAGNOSIS — N401 Enlarged prostate with lower urinary tract symptoms: Secondary | ICD-10-CM | POA: Diagnosis not present

## 2024-04-01 DIAGNOSIS — C61 Malignant neoplasm of prostate: Secondary | ICD-10-CM | POA: Diagnosis not present

## 2024-04-03 ENCOUNTER — Other Ambulatory Visit: Payer: Self-pay | Admitting: Cardiology

## 2024-07-27 ENCOUNTER — Ambulatory Visit: Admitting: Cardiology
